# Patient Record
Sex: Female | Born: 1955
Health system: Southern US, Community
[De-identification: ages and names within clinical notes are randomized; demographics above are authoritative.]

## PROBLEM LIST (undated history)

## (undated) DIAGNOSIS — F329 Major depressive disorder, single episode, unspecified: Secondary | ICD-10-CM

## (undated) DIAGNOSIS — M1990 Unspecified osteoarthritis, unspecified site: Secondary | ICD-10-CM

## (undated) DIAGNOSIS — R51 Headache: Secondary | ICD-10-CM

## (undated) DIAGNOSIS — K5790 Diverticulosis of intestine, part unspecified, without perforation or abscess without bleeding: Secondary | ICD-10-CM

## (undated) DIAGNOSIS — R519 Headache, unspecified: Secondary | ICD-10-CM

## (undated) DIAGNOSIS — D649 Anemia, unspecified: Secondary | ICD-10-CM

## (undated) DIAGNOSIS — I471 Supraventricular tachycardia, unspecified: Secondary | ICD-10-CM

## (undated) DIAGNOSIS — K219 Gastro-esophageal reflux disease without esophagitis: Secondary | ICD-10-CM

## (undated) DIAGNOSIS — I499 Cardiac arrhythmia, unspecified: Secondary | ICD-10-CM

## (undated) DIAGNOSIS — F419 Anxiety disorder, unspecified: Secondary | ICD-10-CM

## (undated) DIAGNOSIS — F32A Depression, unspecified: Secondary | ICD-10-CM

## (undated) DIAGNOSIS — Z86010 Personal history of colonic polyps: Secondary | ICD-10-CM

## (undated) HISTORY — DX: Headache, unspecified: R51.9

## (undated) HISTORY — DX: Major depressive disorder, single episode, unspecified: F32.9

## (undated) HISTORY — DX: Headache: R51

## (undated) HISTORY — DX: Supraventricular tachycardia, unspecified: I47.10

## (undated) HISTORY — DX: Gastro-esophageal reflux disease without esophagitis: K21.9

## (undated) HISTORY — PX: DILATION AND CURETTAGE OF UTERUS: SHX78

## (undated) HISTORY — DX: Anxiety disorder, unspecified: F41.9

## (undated) HISTORY — DX: Depression, unspecified: F32.A

## (undated) HISTORY — DX: Unspecified osteoarthritis, unspecified site: M19.90

## (undated) HISTORY — DX: Anemia, unspecified: D64.9

## (undated) HISTORY — DX: Supraventricular tachycardia: I47.1

## (undated) HISTORY — DX: Personal history of colonic polyps: Z86.010

## (undated) HISTORY — DX: Diverticulosis of intestine, part unspecified, without perforation or abscess without bleeding: K57.90

## (undated) HISTORY — PX: FRACTURE SURGERY: SHX138

---

## 2004-12-27 ENCOUNTER — Ambulatory Visit: Payer: Self-pay | Admitting: Unknown Physician Specialty

## 2005-03-12 ENCOUNTER — Ambulatory Visit: Payer: Self-pay | Admitting: Unknown Physician Specialty

## 2009-07-29 DIAGNOSIS — Z860100 Personal history of colon polyps, unspecified: Secondary | ICD-10-CM

## 2009-07-29 HISTORY — DX: Personal history of colon polyps, unspecified: Z86.0100

## 2009-07-29 HISTORY — DX: Personal history of colonic polyps: Z86.010

## 2010-06-15 ENCOUNTER — Ambulatory Visit: Payer: Self-pay | Admitting: Unknown Physician Specialty

## 2010-06-18 LAB — PATHOLOGY REPORT

## 2010-09-12 LAB — HM PAP SMEAR: HM Pap smear: NORMAL

## 2011-08-31 ENCOUNTER — Emergency Department: Payer: Self-pay | Admitting: Emergency Medicine

## 2011-08-31 LAB — COMPREHENSIVE METABOLIC PANEL
Albumin: 4.1 g/dL (ref 3.4–5.0)
BUN: 20 mg/dL — ABNORMAL HIGH (ref 7–18)
Calcium, Total: 8.8 mg/dL (ref 8.5–10.1)
Creatinine: 0.84 mg/dL (ref 0.60–1.30)
EGFR (Non-African Amer.): >60
Glucose: 84 mg/dL (ref 65–99)
Potassium: 3.8 mmol/L (ref 3.5–5.1)
SGOT(AST): 35 U/L (ref 15–37)
SGPT (ALT): 42 U/L
Total Protein: 7.4 g/dL (ref 6.4–8.2)

## 2011-08-31 LAB — CK TOTAL AND CKMB (NOT AT ARMC)
CK, Total: 154 U/L (ref 21–215)
CK-MB: 1.7 ng/mL (ref 0.5–3.6)

## 2011-08-31 LAB — CBC
HCT: 37.4 % (ref 35.0–47.0)
HGB: 12.8 g/dL (ref 12.0–16.0)
MCH: 32.5 pg (ref 26.0–34.0)
MCHC: 34.2 g/dL (ref 32.0–36.0)
MCV: 95 fL (ref 80–100)
Platelet: 246 10*3/uL (ref 150–440)
RBC: 3.93 10*6/uL (ref 3.80–5.20)
RDW: 12.5 % (ref 11.5–14.5)
WBC: 6.3 10*3/uL (ref 3.6–11.0)

## 2011-08-31 LAB — TROPONIN I: Troponin-I: <0.02 ng/mL

## 2011-08-31 LAB — TSH: Thyroid Stimulating Horm: 2.26 u[IU]/mL

## 2011-08-31 LAB — T4, FREE: Free Thyroxine: 0.8 ng/dL (ref 0.76–1.46)

## 2011-10-11 ENCOUNTER — Ambulatory Visit (INDEPENDENT_AMBULATORY_CARE_PROVIDER_SITE_OTHER): Payer: BC Managed Care – PPO | Admitting: Internal Medicine

## 2011-10-11 ENCOUNTER — Encounter: Payer: Self-pay | Admitting: Internal Medicine

## 2011-10-11 ENCOUNTER — Telehealth: Payer: Self-pay | Admitting: Internal Medicine

## 2011-10-11 VITALS — BP 112/68 | HR 94 | Temp 98.1°F | Resp 14 | Ht 65.5 in | Wt 122.8 lb

## 2011-10-11 DIAGNOSIS — G43909 Migraine, unspecified, not intractable, without status migrainosus: Secondary | ICD-10-CM | POA: Insufficient documentation

## 2011-10-11 DIAGNOSIS — I498 Other specified cardiac arrhythmias: Secondary | ICD-10-CM

## 2011-10-11 DIAGNOSIS — K2100 Gastro-esophageal reflux disease with esophagitis, without bleeding: Secondary | ICD-10-CM | POA: Insufficient documentation

## 2011-10-11 DIAGNOSIS — F324 Major depressive disorder, single episode, in partial remission: Secondary | ICD-10-CM

## 2011-10-11 DIAGNOSIS — M810 Age-related osteoporosis without current pathological fracture: Secondary | ICD-10-CM | POA: Insufficient documentation

## 2011-10-11 DIAGNOSIS — Z87898 Personal history of other specified conditions: Secondary | ICD-10-CM

## 2011-10-11 DIAGNOSIS — N3010 Interstitial cystitis (chronic) without hematuria: Secondary | ICD-10-CM

## 2011-10-11 DIAGNOSIS — K5790 Diverticulosis of intestine, part unspecified, without perforation or abscess without bleeding: Secondary | ICD-10-CM | POA: Insufficient documentation

## 2011-10-11 DIAGNOSIS — IMO0002 Reserved for concepts with insufficient information to code with codable children: Secondary | ICD-10-CM

## 2011-10-11 DIAGNOSIS — N840 Polyp of corpus uteri: Secondary | ICD-10-CM

## 2011-10-11 DIAGNOSIS — Z1239 Encounter for other screening for malignant neoplasm of breast: Secondary | ICD-10-CM

## 2011-10-11 DIAGNOSIS — I471 Supraventricular tachycardia: Secondary | ICD-10-CM | POA: Insufficient documentation

## 2011-10-11 HISTORY — DX: Interstitial cystitis (chronic) without hematuria: N30.10

## 2011-10-11 MED ORDER — CLONAZEPAM 0.5 MG PO TABS: 0.5000 mg | ORAL_TABLET | Freq: Two times a day (BID) | ORAL | Status: AC | PRN

## 2011-10-11 NOTE — Assessment & Plan Note (Addendum)
By endoscopy,  No sign of Barretts, has tried aciphex, protonix, and prilosec, with nexium currently her choice as the others were ineffective.

## 2011-10-11 NOTE — Telephone Encounter (Signed)
Pt has mammogram appoinemtn 3/22/ @ [facility] Pt aware of appoinemtn

## 2011-10-11 NOTE — Progress Notes (Deleted)
  Subjective:    Patient ID: Lauren Tanner, female    DOB: 06-05-1956, 56 y.o.   MRN: [id number]  HPI    Review of Systems     Objective:   Physical Exam        Assessment & Plan:

## 2011-10-11 NOTE — Assessment & Plan Note (Signed)
CT scna nefative around 2008 er  Visit look up on [place].

## 2011-10-13 ENCOUNTER — Encounter: Payer: Self-pay | Admitting: Internal Medicine

## 2011-10-13 DIAGNOSIS — R519 Headache, unspecified: Secondary | ICD-10-CM | POA: Insufficient documentation

## 2011-10-13 DIAGNOSIS — F329 Major depressive disorder, single episode, unspecified: Secondary | ICD-10-CM | POA: Insufficient documentation

## 2011-10-13 DIAGNOSIS — F32A Depression, unspecified: Secondary | ICD-10-CM | POA: Insufficient documentation

## 2011-10-13 DIAGNOSIS — Z800 Family history of malignant neoplasm of digestive organs: Secondary | ICD-10-CM | POA: Insufficient documentation

## 2011-10-13 DIAGNOSIS — K219 Gastro-esophageal reflux disease without esophagitis: Secondary | ICD-10-CM | POA: Insufficient documentation

## 2011-10-13 NOTE — Assessment & Plan Note (Signed)
Found remotely incidentally, was seeing DeFrancesco at the time.  D & C punctured the uterus during an attempt to biopsy,  Repeat CT was negative.

## 2011-10-13 NOTE — Assessment & Plan Note (Signed)
With ER visit for evaluation negative for ischemia, electrolyte disturbances,  Thyroid disorder, and atrial fibrillation.  Has not had any prlonged episodes since ER evaluation.  Records requested. Exam is normal

## 2011-10-13 NOTE — Assessment & Plan Note (Signed)
Decreased with preventive medication .  Prior trials of topamax gave her headaches. Managed acutely with naratriptan.  [facility] [place] manages.

## 2011-10-13 NOTE — Assessment & Plan Note (Signed)
Diagnosed by DEXA.  No history of fractures.  treated with alendronate for 5 years,  Medication was stopped in 2012.  May need DEXA this year depending on records. Last one at [facility] or [place]

## 2011-10-13 NOTE — Progress Notes (Signed)
Patient ID: Lauren Tanner, female   DOB: 1955/08/22, 56 y.o.   MRN: [id number]  Patient Active Problem List  Diagnoses  . Reflux esophagitis  . SVT (supraventricular tachycardia)  . Diverticulosis  . History of abdominal pain  . Osteoporosis, post-menopausal  . Uterine polyp  . Chronic interstitial cystitis  . Depression, major, in partial remission  . Migraine headache  . Headache disorder  . GERD (gastroesophageal reflux disease)  . Depression  . History of colonic polyps    Subjective:  CC:   Chief Complaint  Patient presents with  . New Patient    HPI:   Lauren Tanner a 56 y.o. female who presents for establishment of primary care in transfer from [facility] .  Chief complaint is recent episode of supraventricular tachycardia. Patient was evaluated in the emergency room in in February of 2013 for prolonged episode of rapid heartbeat accompanied by presyncopal feelings. She was treated with IV Cardizem. Her evaluation was non-concerning for ischemia and hyperthyroidism as well as atrial fibrillation she was sent home with followup cardiology and saw a PA and medical clinic. Oral diltiazem prescription was given. Patient has not had to use it since that episode. She believes that her symptoms were brought on by a hot flash. She has been having hot flashes for the last year or 2. She has never been on hormone therapy and is not interested in it at this time.   Past Medical History  Diagnosis Date  . SVT (supraventricular tachycardia)     normal screening ER workup  . Diverticulosis   . Headache disorder     managed with imipramine  . GERD (gastroesophageal reflux disease)   . Depression     no history of hospitalizations  . History of colonic polyps 2011    last colonoscopy, [facility]    History reviewed. No pertinent past surgical history.       The following portions of the patient's history were reviewed and updated as appropriate: Allergies, current  medications, and problem list.    Review of Systems:   Comprehensive Review of systems was negative except those addressed in the HPI,     History   Social History  . Marital Status: Married    Spouse Name: N/A    Number of Children: N/A  . Years of Education: N/A   Occupational History  . Not on file.   Social History Main Topics  . Smoking status: Never Smoker   . Smokeless tobacco: Never Used  . Alcohol Use: No  . Drug Use: No  . Sexually Active: Not on file   Other Topics Concern  . Not on file   Social History Narrative  . No narrative on file    Objective:  BP 112/68  Pulse 94  Temp(Src) 98.1 F (36.7 C) (Oral)  Resp 14  Ht 5' 5.5" (1.664 m)  Wt 122 lb 12 oz (55.679 kg)  BMI 20.12 kg/m2  SpO2 100%  General appearance: alert, cooperative and appears stated age Ears: normal TM's and external ear canals both ears Throat: lips, mucosa, and tongue normal; teeth and gums normal Neck: no adenopathy, no carotid bruit, supple, symmetrical, trachea midline and thyroid not enlarged, symmetric, no tenderness/mass/nodules Back: symmetric, no curvature. ROM normal. No CVA tenderness. Lungs: clear to auscultation bilaterally Heart: regular rate and rhythm, S1, S2 normal, no murmur, click, rub or gallop Abdomen: soft, non-tender; bowel sounds normal; no masses,  no organomegaly Pulses: 2+ and symmetric Skin: Skin color,  texture, turgor normal. No rashes or lesions Lymph nodes: Cervical, supraclavicular, and axillary nodes normal.  Assessment and Plan:  Reflux esophagitis By endoscopy,  No sign of Barretts, has tried aciphex, protonix, and prilosec, with nexium currently her choice as the others were ineffective.   History of abdominal pain CT scna nefative around 2008 er  Visit look up on [place].   Migraine headache Decreased with preventive medication .  Prior trials of topamax gave her headaches. Managed acutely with naratriptan.  [facility]  in [place] manages.     Osteoporosis, post-menopausal Diagnosed by DEXA.  No history of fractures.  treated with alendronate for 5 years,  Medication was stopped in 2012.  May need DEXA this year depending on records. Last one at [facility] or [place]   SVT (supraventricular tachycardia) With ER visit for evaluation negative for ischemia, electrolyte disturbances,  Thyroid disorder, and atrial fibrillation.  Has not had any prlonged episodes since ER evaluation.  Records requested. Exam is normal  Uterine polyp Found remotely incidentally, was seeing DeFrancesco at the time.  D & C punctured the uterus during an attempt to biopsy,  Repeat CT was negative.    Chronic interstitial cystitis By histroy with last flare occurring one ago.  Failed all therapy trials .   Her symptoms resolved when her  Improved with change in generic  formulation of lamictal.       Updated Medication List Outpatient Encounter Prescriptions as of 10/11/2011  Medication Sig Dispense Refill  . buPROPion (WELLBUTRIN XL) 300 MG 24 hr tablet Take 300 mg by mouth daily.      . clonazePAM (KLONOPIN) 0.5 MG tablet Take 1 tablet (0.5 mg total) by mouth 2 (two) times daily as needed for anxiety.  30 tablet  0  . esomeprazole (NEXIUM) 40 MG capsule Take 40 mg by mouth daily before breakfast.      . imipramine (TOFRANIL) 50 MG tablet Take 50 mg by mouth 2 (two) times daily.      Marland Kitchen lamoTRIgine (LAMICTAL) 100 MG tablet Take 100 mg by mouth 3 (three) times daily.         Orders Placed This Encounter  Procedures  . HM MAMMOGRAPHY  . HM DEXA SCAN  . HM PAP SMEAR  . HM COLONOSCOPY    No Follow-up on file.

## 2011-10-13 NOTE — Assessment & Plan Note (Signed)
By histroy with last flare occurring one ago.  Failed all therapy trials .   Her symptoms resolved when her  Improved with change in generic  formulation of lamictal.

## 2011-11-01 ENCOUNTER — Encounter: Payer: Self-pay | Admitting: Internal Medicine

## 2012-05-11 ENCOUNTER — Other Ambulatory Visit: Payer: Self-pay

## 2012-05-11 NOTE — Telephone Encounter (Signed)
Patient request refill  for Nexium 40 mg. Ok to refill?

## 2012-05-13 ENCOUNTER — Other Ambulatory Visit: Payer: Self-pay

## 2012-05-13 MED ORDER — ESOMEPRAZOLE MAGNESIUM 40 MG PO CPDR: 40.0000 mg | DELAYED_RELEASE_CAPSULE | Freq: Every day | ORAL | Status: DC

## 2012-05-13 NOTE — Telephone Encounter (Signed)
Nexium 40 mg #30 5 R called in to [facility]

## 2012-05-13 NOTE — Telephone Encounter (Signed)
Okay to refill. Please call the pharmacy and update pharmacy information systemic electronically next time.

## 2012-05-13 NOTE — Telephone Encounter (Signed)
Left message to call back with pharmacy information

## 2012-05-21 ENCOUNTER — Telehealth: Payer: Self-pay | Admitting: Internal Medicine

## 2012-05-21 MED ORDER — ESOMEPRAZOLE MAGNESIUM 40 MG PO CPDR: 40.0000 mg | DELAYED_RELEASE_CAPSULE | Freq: Every day | ORAL | Status: DC

## 2012-05-21 NOTE — Telephone Encounter (Signed)
Patient came in stated that her Nexium 40 mg#90 3 R was sent to wrong pharmacy. I called it [facility] [phone].

## 2012-05-21 NOTE — Telephone Encounter (Signed)
Pt was wanting a call back from [place] she said you called her yesterday [facility] number [phone]

## 2012-06-15 ENCOUNTER — Emergency Department: Payer: Self-pay | Admitting: Emergency Medicine

## 2012-06-15 LAB — COMPREHENSIVE METABOLIC PANEL
Albumin: 4 g/dL (ref 3.4–5.0)
Anion Gap: 5 — ABNORMAL LOW (ref 7–16)
BUN: 22 mg/dL — ABNORMAL HIGH (ref 7–18)
Calcium, Total: 8.4 mg/dL — ABNORMAL LOW (ref 8.5–10.1)
Co2: 27 mmol/L (ref 21–32)
EGFR (African American): >60
EGFR (Non-African Amer.): >60
Glucose: 105 mg/dL — ABNORMAL HIGH (ref 65–99)
Potassium: 4 mmol/L (ref 3.5–5.1)
SGOT(AST): 38 U/L — ABNORMAL HIGH (ref 15–37)
SGPT (ALT): 53 U/L (ref 12–78)
Total Protein: 6.9 g/dL (ref 6.4–8.2)

## 2012-06-15 LAB — CBC
HCT: 33.9 % — ABNORMAL LOW (ref 35.0–47.0)
HGB: 11.6 g/dL — ABNORMAL LOW (ref 12.0–16.0)
MCH: 32.3 pg (ref 26.0–34.0)
Platelet: 215 10*3/uL (ref 150–440)
RBC: 3.6 10*6/uL — ABNORMAL LOW (ref 3.80–5.20)
WBC: 8.8 10*3/uL (ref 3.6–11.0)

## 2012-06-15 LAB — URINALYSIS, COMPLETE
Bilirubin,UR: NEGATIVE
Blood: NEGATIVE
Glucose,UR: NEGATIVE mg/dL (ref 0–75)
Leukocyte Esterase: NEGATIVE
Nitrite: NEGATIVE
Ph: 6 (ref 4.5–8.0)
Protein: NEGATIVE
RBC,UR: NONE SEEN /HPF (ref 0–5)
Specific Gravity: 1.018 (ref 1.003–1.030)
Squamous Epithelial: 1

## 2012-11-17 ENCOUNTER — Other Ambulatory Visit (HOSPITAL_COMMUNITY)
Admission: RE | Admit: 2012-11-17 | Discharge: 2012-11-17 | Disposition: A | Discharge status: Home / Self Care | Payer: BC Managed Care – PPO | Source: Ambulatory Visit | Attending: Internal Medicine | Admitting: Internal Medicine

## 2012-11-17 ENCOUNTER — Encounter: Payer: Self-pay | Admitting: Internal Medicine

## 2012-11-17 ENCOUNTER — Ambulatory Visit (INDEPENDENT_AMBULATORY_CARE_PROVIDER_SITE_OTHER): Payer: BC Managed Care – PPO | Admitting: Internal Medicine

## 2012-11-17 VITALS — BP 106/78 | HR 81 | Temp 97.9°F | Resp 16 | Ht 66.0 in | Wt 114.5 lb

## 2012-11-17 DIAGNOSIS — Z1151 Encounter for screening for human papillomavirus (HPV): Secondary | ICD-10-CM | POA: Insufficient documentation

## 2012-11-17 DIAGNOSIS — R5383 Other fatigue: Secondary | ICD-10-CM

## 2012-11-17 DIAGNOSIS — Z1239 Encounter for other screening for malignant neoplasm of breast: Secondary | ICD-10-CM

## 2012-11-17 DIAGNOSIS — N952 Postmenopausal atrophic vaginitis: Secondary | ICD-10-CM

## 2012-11-17 DIAGNOSIS — E785 Hyperlipidemia, unspecified: Secondary | ICD-10-CM

## 2012-11-17 DIAGNOSIS — R7989 Other specified abnormal findings of blood chemistry: Secondary | ICD-10-CM

## 2012-11-17 DIAGNOSIS — R5381 Other malaise: Secondary | ICD-10-CM

## 2012-11-17 DIAGNOSIS — Z86010 Personal history of colonic polyps: Secondary | ICD-10-CM

## 2012-11-17 DIAGNOSIS — N840 Polyp of corpus uteri: Secondary | ICD-10-CM

## 2012-11-17 DIAGNOSIS — Z0000 Encounter for general adult medical examination without abnormal findings: Secondary | ICD-10-CM

## 2012-11-17 DIAGNOSIS — Z01419 Encounter for gynecological examination (general) (routine) without abnormal findings: Secondary | ICD-10-CM | POA: Insufficient documentation

## 2012-11-17 DIAGNOSIS — M810 Age-related osteoporosis without current pathological fracture: Secondary | ICD-10-CM

## 2012-11-17 DIAGNOSIS — E559 Vitamin D deficiency, unspecified: Secondary | ICD-10-CM

## 2012-11-17 LAB — COMPREHENSIVE METABOLIC PANEL
AST: 30 U/L (ref 0–37)
BUN: 15 mg/dL (ref 6–23)
Calcium: 9.2 mg/dL (ref 8.4–10.5)
Chloride: 101 mEq/L (ref 96–112)
Creatinine, Ser: 0.9 mg/dL (ref 0.4–1.2)
Glucose, Bld: 80 mg/dL (ref 70–99)

## 2012-11-17 LAB — CBC WITH DIFFERENTIAL/PLATELET
Basophils Absolute: 0 10*3/uL (ref 0.0–0.1)
Eosinophils Absolute: 0 10*3/uL (ref 0.0–0.7)
HCT: 36.1 % (ref 36.0–46.0)
Lymphs Abs: 1.6 10*3/uL (ref 0.7–4.0)
MCHC: 33.8 g/dL (ref 30.0–36.0)
MCV: 93.7 fl (ref 78.0–100.0)
Monocytes Absolute: 0.3 10*3/uL (ref 0.1–1.0)
Neutrophils Relative %: 58.1 % (ref 43.0–77.0)
Platelets: 240 10*3/uL (ref 150.0–400.0)
RDW: 12.9 % (ref 11.5–14.6)
WBC: 4.5 10*3/uL (ref 4.5–10.5)

## 2012-11-17 LAB — LIPID PANEL
Cholesterol: 205 mg/dL — ABNORMAL HIGH (ref 0–200)
HDL: 48 mg/dL (ref >39.00)
Triglycerides: 68 mg/dL (ref 0.0–149.0)

## 2012-11-17 LAB — LDL CHOLESTEROL, DIRECT: Direct LDL: 135.9 mg/dL

## 2012-11-17 LAB — TSH: TSH: 2.06 u[IU]/mL (ref 0.35–5.50)

## 2012-11-17 MED ORDER — ESTRADIOL 25 MCG VA TABS: 25.0000 ug | ORAL_TABLET | Freq: Every day | VAGINAL | Status: DC

## 2012-11-17 NOTE — Patient Instructions (Addendum)
You exam is normal  We will call or e mail you the results of your tests today  I have given you both vaginal tablets and vaginal cream so you can try both methods of hormonal treatment for vaginal atrophy  Both methods require nightly use for 2 weeks, the reduce use to twice weekly  If you decide you want to continue the medication,  Let me know and we will write an rx  And order  Baseline ultrasound

## 2012-11-17 NOTE — Assessment & Plan Note (Signed)
Palpable on exam.  If she decides to continue vaginal estrogen we will obtain a baseline pelvic ultrasound

## 2012-11-17 NOTE — Assessment & Plan Note (Signed)
Last DEXA 2008.  Repeating this year.  contineu alendronate and weight bearing exercise

## 2012-11-17 NOTE — Progress Notes (Signed)
Patient ID: Lauren Tanner, female   DOB: 29-Mar-1956, 57 y.o.   MRN: [id number]  Subjective:     Lauren Tanner is a 57 y.o. female and is here for a comprehensive physical exam. The patient reports Vaginal dryness and dyspareunia .     History   Social History  . Marital Status: Married    Spouse Name: N/A    Number of Children: N/A  . Years of Education: N/A   Occupational History  . Not on file.   Social History Main Topics  . Smoking status: Never Smoker   . Smokeless tobacco: Never Used  . Alcohol Use: No  . Drug Use: No  . Sexually Active: Not on file   Other Topics Concern  . Not on file   Social History Narrative  . No narrative on file   Health Maintenance  Topic Date Due  . Tetanus/tdap  12/07/1975  . Influenza Vaccine  03/29/2013  . Pap Smear  09/12/2013  . Mammogram  10/17/2013  . Colonoscopy  06/15/2020    The following portions of the patient's history were reviewed and updated as appropriate: allergies, current medications, past family history, past medical history, past social history, past surgical history and problem list.  Review of Systems A comprehensive review of systems was negative.   Objective:    BP 106/78  Pulse 81  Temp(Src) 97.9 F (36.6 C) (Oral)  Resp 16  Ht 5\' 6"  (1.676 m)  Wt 114 lb 8 oz (51.937 kg)  BMI 18.49 kg/m2  SpO2 95%  General Appearance:    Thin, Alert, cooperative, no distress, appears stated age  Head:    Normocephalic, without obvious abnormality, atraumatic  Eyes:    PERRL, conjunctiva/corneas clear, EOM's intact, fundi    benign, both eyes  Ears:    Normal TM's and external ear canals, both ears  Nose:   Nares normal, septum midline, mucosa normal, no drainage    or sinus tenderness  Throat:   Lips, mucosa, and tongue normal; teeth and gums normal  Neck:   Supple, symmetrical, trachea midline, no adenopathy;    thyroid:  no enlargement/tenderness/nodules; no carotid   bruit or JVD  Back:     Symmetric, no  curvature, ROM normal, no CVA tenderness  Lungs:     Clear to auscultation bilaterally, respirations unlabored  Chest Wall:    No tenderness or deformity   Heart:    Regular rate and rhythm, S1 and S2 normal, no murmur, rub   or gallop  Breast Exam:    No tenderness, masses, or nipple abnormality  Abdomen:     Soft, non-tender, bowel sounds active all four quadrants,    no masses, no organomegaly  Genitalia:    Pelvic: cervix normal in appearance, external genitalia normal, no adnexal masses or tenderness, no cervical motion tenderness, rectovaginal septum normal, uterus enlarged for age,  Fibroid appreciated, vagina pale  without discharge,  Vault narrow,  Decreased moisture  Extremities:   Extremities normal, atraumatic, no cyanosis or edema  Pulses:   2+ and symmetric all extremities  Skin:   Skin color, texture, turgor normal, no rashes or lesions  Lymph nodes:   Cervical, supraclavicular, and axillary nodes normal  Neurologic:   CNII-XII intact, normal strength, sensation and reflexes    throughout     .    Assessment:      Uterine polyp Palpable on exam.  If she decides to continue vaginal estrogen we will obtain a baseline pelvic  ultrasound   Osteoporosis, post-menopausal Last DEXA 2008.  Repeating this year.  contineu alendronate and weight bearing exercise  History of colonic polyps Sent home with FOBTS to do annually until 2016.   Postmenopausal atrophic vaginitis Discussed trial of topical vaginal hormonal therapy.  Samples of  Vagifem and Premarin vaginal cream given for a month trial.  If she decides to continue one of the medications,  We will get a baseline ultrasound.    Updated Medication List Outpatient Encounter Prescriptions as of 11/17/2012  Medication Sig Dispense Refill  . buPROPion (WELLBUTRIN XL) 300 MG 24 hr tablet Take 300 mg by mouth daily.      Marland Kitchen esomeprazole (NEXIUM) 40 MG capsule Take 1 capsule (40 mg total) by mouth daily before breakfast.  90  capsule  3  . imipramine (TOFRANIL) 50 MG tablet Take 50 mg by mouth 2 (two) times daily.      Marland Kitchen lamoTRIgine (LAMICTAL) 100 MG tablet Take 100 mg by mouth 3 (three) times daily.       No facility-administered encounter medications on file as of 11/17/2012.

## 2012-11-17 NOTE — Assessment & Plan Note (Signed)
Sent home with FOBTS to do annually until 2016.

## 2012-11-17 NOTE — Assessment & Plan Note (Signed)
Discussed trial of topical vaginal hormonal therapy.  Samples of  Vagifem and Premarin vaginal cream given for a month trial.  If she decides to continue one of the medications,  We will get a baseline ultrasound.

## 2012-11-18 NOTE — Addendum Note (Signed)
Addended by: Sherlene Shams on: 11/18/2012 06:53 AM   Modules accepted: Orders

## 2012-12-01 ENCOUNTER — Telehealth: Payer: Self-pay | Admitting: Internal Medicine

## 2012-12-01 MED ORDER — DENOSUMAB 60 MG/ML ~~LOC~~ SOLN: 60.0000 mg | Freq: Once | SUBCUTANEOUS | Status: AC

## 2012-12-01 NOTE — Telephone Encounter (Signed)
Patient states previous MD took her off the Alendronate and she does not currently have a script needs [facility] for [facility].

## 2012-12-01 NOTE — Telephone Encounter (Signed)
After further review,  Since she already took it for 5 years and has reflux esophagitis, it is not my first choice to resume it  I would like to see if her insurance will pay for the twice annual shot called Prolia.

## 2012-12-01 NOTE — Telephone Encounter (Signed)
Lauren Tanner,  See previously unrouted note to you

## 2012-12-01 NOTE — Telephone Encounter (Signed)
Called patient left detailed message as to trying to get authorization for prolia

## 2012-12-01 NOTE — Telephone Encounter (Signed)
DEXA scan shows osteoporosis in the spine , osteopenia in the hip. continue alendronate, caclium and vit d

## 2012-12-01 NOTE — Telephone Encounter (Signed)
Lauren Tanner,  I would like this patient to start Prolia injections for postmenopausal osteoporosis.  Please find out if her insurance will pay for it. I noticed that [facility] has a prolia injection form .  Are we supposed to be using these forms when administered or only if we sent them to [facility] to have the injection?  TT

## 2012-12-03 ENCOUNTER — Telehealth: Payer: Self-pay | Admitting: *Deleted

## 2012-12-03 NOTE — Telephone Encounter (Signed)
On [facility] pharmacy would like to clarify as to one time dose or cycle.

## 2012-12-04 ENCOUNTER — Other Ambulatory Visit: Payer: BC Managed Care – PPO

## 2012-12-04 NOTE — Telephone Encounter (Signed)
Notified pharmacy script should be filled through our facility.

## 2012-12-04 NOTE — Telephone Encounter (Signed)
Prolia is twice a year. She shouldn't be getting it through her pharmacy.  It should be ordered through [facility] and delivered to office because it is $$$$$$$$$

## 2012-12-07 ENCOUNTER — Other Ambulatory Visit: Payer: BC Managed Care – PPO

## 2012-12-08 ENCOUNTER — Other Ambulatory Visit (INDEPENDENT_AMBULATORY_CARE_PROVIDER_SITE_OTHER): Payer: BC Managed Care – PPO

## 2012-12-08 DIAGNOSIS — R7989 Other specified abnormal findings of blood chemistry: Secondary | ICD-10-CM

## 2012-12-08 LAB — HEPATIC FUNCTION PANEL
ALT: 45 U/L — ABNORMAL HIGH (ref 0–35)
AST: 29 U/L (ref 0–37)
Total Bilirubin: 0.6 mg/dL (ref 0.3–1.2)
Total Protein: 6.5 g/dL (ref 6.0–8.3)

## 2012-12-08 LAB — IRON AND TIBC
Iron: 77 ug/dL (ref 42–145)
UIBC: 193 ug/dL (ref 125–400)

## 2012-12-09 LAB — FERRITIN: Ferritin: 83 ng/mL (ref 10–291)

## 2012-12-09 LAB — HEPATITIS B SURFACE ANTIBODY,QUALITATIVE: Hep B S Ab: REACTIVE — AB

## 2012-12-09 LAB — ANTI-SMITH ANTIBODY: ENA SM Ab Ser-aCnc: <1 AU/mL (ref <30)

## 2012-12-10 ENCOUNTER — Encounter: Payer: Self-pay | Admitting: Internal Medicine

## 2012-12-10 NOTE — Addendum Note (Signed)
Addended by: Sherlene Shams on: 12/10/2012 10:41 AM   Modules accepted: Orders

## 2012-12-15 ENCOUNTER — Telehealth: Payer: Self-pay | Admitting: *Deleted

## 2012-12-15 NOTE — Telephone Encounter (Signed)
Unread MyChart message: "Your additional blood work suggested no infectious or autoimmune causes for the slight liver enzyme elevation that persists on your hepatic panel. The one positive hepatitis antibody test suggests that you have received the hepatitis b vaccine in the past . If this is not true , Let me know.   I would like to obtain an ultrasound of your liver for additional evaluation . Amber will set that up and call you."  Left message for pt to return my call.

## 2012-12-16 NOTE — Telephone Encounter (Signed)
Yes patient had Hepatitis vaccine in the past but couldn't remember date.  Patient notified as requested as to referral

## 2012-12-18 ENCOUNTER — Ambulatory Visit: Payer: Self-pay | Admitting: Internal Medicine

## 2012-12-22 ENCOUNTER — Telehealth: Payer: Self-pay | Admitting: Internal Medicine

## 2012-12-22 NOTE — Telephone Encounter (Signed)
Ultrasound of liver normal except for a polyp in the gallbladder.  Nothing to do

## 2012-12-22 NOTE — Telephone Encounter (Signed)
Patient notified of results by phone.

## 2012-12-25 ENCOUNTER — Encounter: Payer: Self-pay | Admitting: Internal Medicine

## 2013-01-20 LAB — HM PAP SMEAR: HM PAP: NORMAL

## 2013-02-02 LAB — HM DEXA SCAN

## 2013-02-12 ENCOUNTER — Encounter: Payer: Self-pay | Admitting: Internal Medicine

## 2013-02-22 ENCOUNTER — Encounter: Payer: Self-pay | Admitting: Internal Medicine

## 2013-02-23 NOTE — Telephone Encounter (Signed)
Yes, but you will need to check that it has been received.  I will forward your inquiry

## 2013-05-27 ENCOUNTER — Telehealth: Payer: Self-pay | Admitting: *Deleted

## 2013-05-27 MED ORDER — ESOMEPRAZOLE MAGNESIUM 40 MG PO CPDR: 40.0000 mg | DELAYED_RELEASE_CAPSULE | Freq: Every day | ORAL | Status: DC

## 2013-05-27 NOTE — Telephone Encounter (Signed)
Called for script refill on Nexium sent electronically

## 2013-06-03 ENCOUNTER — Other Ambulatory Visit: Payer: Self-pay

## 2013-09-15 ENCOUNTER — Telehealth: Payer: Self-pay | Admitting: Internal Medicine

## 2013-09-15 NOTE — Telephone Encounter (Signed)
Paper received from Prolia for injection reminder.  Please tell Lupita LeashDonna when last Prolia was received so insurance authorization can be sent.

## 2013-09-17 NOTE — Telephone Encounter (Signed)
Patient has not received first dose of prolia patient scheduled to come in may appointment was cancelled and never rescheuled

## 2014-01-20 ENCOUNTER — Encounter: Payer: Self-pay | Admitting: Internal Medicine

## 2014-01-20 ENCOUNTER — Ambulatory Visit (INDEPENDENT_AMBULATORY_CARE_PROVIDER_SITE_OTHER): Payer: BC Managed Care – PPO | Admitting: Internal Medicine

## 2014-01-20 ENCOUNTER — Telehealth: Payer: Self-pay | Admitting: Internal Medicine

## 2014-01-20 VITALS — BP 118/74 | HR 88 | Temp 98.6°F | Resp 16 | Ht 65.25 in | Wt 116.5 lb

## 2014-01-20 DIAGNOSIS — Z0000 Encounter for general adult medical examination without abnormal findings: Secondary | ICD-10-CM

## 2014-01-20 DIAGNOSIS — R259 Unspecified abnormal involuntary movements: Secondary | ICD-10-CM

## 2014-01-20 DIAGNOSIS — R748 Abnormal levels of other serum enzymes: Secondary | ICD-10-CM

## 2014-01-20 DIAGNOSIS — IMO0002 Reserved for concepts with insufficient information to code with codable children: Secondary | ICD-10-CM

## 2014-01-20 DIAGNOSIS — M6281 Muscle weakness (generalized): Secondary | ICD-10-CM

## 2014-01-20 DIAGNOSIS — E559 Vitamin D deficiency, unspecified: Secondary | ICD-10-CM

## 2014-01-20 DIAGNOSIS — M810 Age-related osteoporosis without current pathological fracture: Secondary | ICD-10-CM

## 2014-01-20 DIAGNOSIS — F3341 Major depressive disorder, recurrent, in partial remission: Secondary | ICD-10-CM

## 2014-01-20 DIAGNOSIS — Z1239 Encounter for other screening for malignant neoplasm of breast: Secondary | ICD-10-CM

## 2014-01-20 DIAGNOSIS — R251 Tremor, unspecified: Secondary | ICD-10-CM

## 2014-01-20 LAB — CBC WITH DIFFERENTIAL/PLATELET
BASOS ABS: 0 10*3/uL (ref 0.0–0.1)
Basophils Relative: 0.7 % (ref 0.0–3.0)
EOS ABS: 0 10*3/uL (ref 0.0–0.7)
Eosinophils Relative: 0 % (ref 0.0–5.0)
HEMATOCRIT: 35.5 % — AB (ref 36.0–46.0)
HEMOGLOBIN: 12.1 g/dL (ref 12.0–15.0)
LYMPHS ABS: 1.7 10*3/uL (ref 0.7–4.0)
Lymphocytes Relative: 33.8 % (ref 12.0–46.0)
MCHC: 34 g/dL (ref 30.0–36.0)
MCV: 95.1 fl (ref 78.0–100.0)
MONO ABS: 0.3 10*3/uL (ref 0.1–1.0)
Monocytes Relative: 6.6 % (ref 3.0–12.0)
NEUTROS ABS: 2.9 10*3/uL (ref 1.4–7.7)
Neutrophils Relative %: 58.9 % (ref 43.0–77.0)
Platelets: 279 10*3/uL (ref 150.0–400.0)
RBC: 3.73 Mil/uL — ABNORMAL LOW (ref 3.87–5.11)
RDW: 12.5 % (ref 11.5–15.5)
WBC: 4.9 10*3/uL (ref 4.0–10.5)

## 2014-01-20 LAB — COMPREHENSIVE METABOLIC PANEL
ALBUMIN: 4.2 g/dL (ref 3.5–5.2)
ALK PHOS: 62 U/L (ref 39–117)
ALT: 80 U/L — AB (ref 0–35)
AST: 43 U/L — ABNORMAL HIGH (ref 0–37)
BILIRUBIN TOTAL: 0.8 mg/dL (ref 0.2–1.2)
BUN: 23 mg/dL (ref 6–23)
CO2: 28 mEq/L (ref 19–32)
Calcium: 9.3 mg/dL (ref 8.4–10.5)
Chloride: 102 mEq/L (ref 96–112)
Creatinine, Ser: 0.9 mg/dL (ref 0.4–1.2)
GFR: 71.3 mL/min (ref >60.00)
GLUCOSE: 73 mg/dL (ref 70–99)
POTASSIUM: 5 meq/L (ref 3.5–5.1)
SODIUM: 137 meq/L (ref 135–145)
TOTAL PROTEIN: 7.1 g/dL (ref 6.0–8.3)

## 2014-01-20 LAB — CK: Total CK: 130 U/L (ref 7–177)

## 2014-01-20 LAB — VITAMIN B12: Vitamin B-12: 1168 pg/mL — ABNORMAL HIGH (ref 211–911)

## 2014-01-20 LAB — TSH: TSH: 1.26 u[IU]/mL (ref 0.35–4.50)

## 2014-01-20 LAB — VITAMIN D 25 HYDROXY (VIT D DEFICIENCY, FRACTURES): VITD: 54.77 ng/mL

## 2014-01-20 NOTE — Patient Instructions (Signed)
You had your annual  wellness exam today.  We will repeat your PAP smear in 2017  Tremor is not commonly reported with either wellbutrin or with lamictal.  [facility]  We will contact you with the bloodwork results  Tremor Tremor is a rhythmic, involuntary muscular contraction characterized by oscillations (to-and-fro movements) of a part of the body. The most common of all involuntary movements, tremor can affect various body parts such as the hands, head, facial structures, vocal cords, trunk, and legs; most tremors, however, occur in the hands. Tremor often accompanies neurological disorders associated with aging. Although the disorder is not life-threatening, it can be responsible for functional disability and social embarrassment. TREATMENT  There are many types of tremor and several ways in which tremor is classified. The most common classification is by behavioral context or position. There are five categories of tremor within this classification: resting, postural, kinetic, task-specific, and psychogenic. Resting or static tremor occurs when the muscle is at rest, for example when the hands are lying on the lap. This type of tremor is often seen in patients with Parkinson's disease. Postural tremor occurs when a patient attempts to maintain posture, such as holding the hands outstretched. Postural tremors include physiological tremor, essential tremor, tremor with basal ganglia disease (also seen in patients with Parkinson's disease), cerebellar postural tremor, tremor with peripheral neuropathy, post-traumatic tremor, and alcoholic tremor. Kinetic or intention (action) tremor occurs during purposeful movement, for example during finger-to-nose testing. Task-specific tremor appears when performing goal-oriented tasks such as handwriting, speaking, or standing. This group consists of primary writing tremor, vocal tremor, and orthostatic tremor. Psychogenic tremor  occurs in both older and younger patients. The key feature of this tremor is that it dramatically lessens or disappears when the patient is distracted. PROGNOSIS There are some treatment options available for tremor; the appropriate treatment depends on accurate diagnosis of the cause. Some tremors respond to treatment of the underlying condition, for example in some cases of psychogenic tremor treating the patient's underlying mental problem may cause the tremor to disappear. Also, patients with tremor due to Parkinson's disease may be treated with Levodopa drug therapy. Symptomatic drug therapy is available for several other tremors as well. For those cases of tremor in which there is no effective drug treatment, physical measures such as teaching the patient to brace the affected limb during the tremor are sometimes useful. Surgical intervention such as thalamotomy or deep brain stimulation may be useful in certain cases. Document Released: 07/05/2002 Document Revised: 10/07/2011 Document Reviewed: 07/15/2005 [facility] Patient Information 2015 [place], [place]. This information is not intended to replace advice given to you by your health care Lyra Alaimo. Make sure you discuss any questions you have with your health care [place].

## 2014-01-20 NOTE — Telephone Encounter (Signed)
Could you please check for insurance authorization for this pt to receive Prolia.  Appt was scheduled in 2014 but pt cancelled/rescheduled and never followed up.

## 2014-01-20 NOTE — Progress Notes (Signed)
Patient ID: Lauren Tanner Querry, female   DOB: [birth year], 58 y.o.   MRN: [id number]   Subjective:     Lauren Tanner Costilla is a 58 y.o. female and is here for a comprehensive physical exam. The patient reports .with minor bilateral hand tremor. Has been waiting for her first prolia injection  since may 2014!!!!!   History   Social History  . Marital Status: Married    Spouse Name: N/A    Number of Children: N/A  . Years of Education: N/A   Occupational History  . Not on file.   Social History Main Topics  . Smoking status: Never Smoker   . Smokeless tobacco: Never Used  . Alcohol Use: No  . Drug Use: No  . Sexual Activity: Not on file   Other Topics Concern  . Not on file   Social History Narrative  . No narrative on file   Health Maintenance  Topic Date Due  . Influenza Vaccine  02/26/2014  . Mammogram  12/02/2014  . Pap Smear  01/21/2016  . Tetanus/tdap  01/21/2019  . Colonoscopy  06/15/2020    The following portions of the patient's history were reviewed and updated as appropriate: allergies, current medications, past family history, past medical history, past social history, past surgical history and problem list.  Review of Systems A comprehensive review of systems was negative.   Objective:   BP 118/74  Pulse 88  Temp(Src) 98.6 F (37 C) (Oral)  Resp 16  Ht 5' 5.25" (1.657 m)  Wt 116 lb 8 oz (52.844 kg)  BMI 19.25 kg/m2  SpO2 98%  Healthy female exam. General appearance: alert, cooperative and appears stated age Head: Normocephalic, without obvious abnormality, atraumatic Eyes: conjunctivae/corneas clear. PERRL, EOM's intact. Fundi benign. Ears: normal TM's and external ear canals both ears Nose: Nares normal. Septum midline. Mucosa normal. No drainage or sinus tenderness. Throat: lips, mucosa, and tongue normal; teeth and gums normal Neck: no adenopathy, no carotid bruit, no JVD, supple, symmetrical, trachea midline and thyroid not enlarged, symmetric, no  tenderness/mass/nodules Lungs: clear to auscultation bilaterally Breasts: normal appearance, no masses or tenderness Heart: regular rate and rhythm, S1, S2 normal, no murmur, click, rub or gallop Abdomen: soft, non-tender; bowel sounds normal; no masses,  no organomegaly Extremities: extremities normal, atraumatic, no cyanosis or edema Pulses: 2+ and symmetric Skin: Skin color, texture, turgor normal. No rashes or lesions Neurologic: Alert and oriented X 3, normal strength and tone. Normal symmetric reflexes. Normal coordination and gait.     .    Assessment and plan:       Tremor of both hands She reports an intention tremor of both hands that on exam today is very fine. Thyroid function is normal.  Reassured her that her tremor was not characteristic of parkinson's disease    Lab Results  Component Value Date   TSH 1.26 01/20/2014     Osteoporosis, post-menopausal She has been waiting for authorization for prolia since her last annual visit May 2014. Discussed the current controversies surrounding the risks and benefits of calcium supplementation.  Encouraged her to increase dietary calcium through natural foods including almond/coconut milk  Routine general medical examination at a health care facility nnual comprehensive exam was done including breast, excluding pelvic and PAP smear. All screenings have been addressed .     Due for next scope 2016 due to history of polyp  In 2011 and FH colon Ca  Depression, major, in partial remission Managed by [facility] psychiatrist  Lucas MallowEva Sikora.  Symptoms are stable   Updated Medication List Outpatient Encounter Prescriptions as of 01/20/2014  Medication Sig  . buPROPion (WELLBUTRIN XL) 300 MG 24 hr tablet Take 300 mg by mouth daily.  Marland Kitchen. esomeprazole (NEXIUM) 40 MG capsule Take 1 capsule (40 mg total) by mouth daily before breakfast.  . estradiol (VAGIFEM) 25 MCG vaginal tablet Place 1 tablet (25 mcg total) vaginally daily. For two weeks,   Then twice weekly thereafter  . imipramine (TOFRANIL) 50 MG tablet Take 50 mg by mouth 2 (two) times daily.  Marland Kitchen. lamoTRIgine (LAMICTAL) 100 MG tablet Take 100 mg by mouth 3 (three) times daily.  Marland Kitchen. denosumab (PROLIA) 60 MG/ML SOLN injection Inject 60 mg into the skin once. Administer in upper arm, thigh, or abdomen

## 2014-01-21 LAB — FOLATE RBC: RBC FOLATE: 407 ng/mL (ref >280)

## 2014-01-22 ENCOUNTER — Encounter: Payer: Self-pay | Admitting: Internal Medicine

## 2014-01-22 DIAGNOSIS — R251 Tremor, unspecified: Secondary | ICD-10-CM | POA: Insufficient documentation

## 2014-01-22 NOTE — Assessment & Plan Note (Signed)
She reports an intention tremor of both hands that on exam today is very fine. Thyroid function is normal.  Reassured her that her tremor was not characteristic of parkinson's disease    Lab Results  Component Value Date   TSH 1.26 01/20/2014

## 2014-01-22 NOTE — Assessment & Plan Note (Signed)
She has been waiting for authorization for prolia since her last annual visit May 2014. Discussed the current controversies surrounding the risks and benefits of calcium supplementation.  Encouraged her to increase dietary calcium through natural foods including almond/coconut milk

## 2014-01-22 NOTE — Assessment & Plan Note (Addendum)
nnual comprehensive exam was done including breast, excluding pelvic and PAP smear. All screenings have been addressed .     Due for next scope 2016 due to history of polyp  In 2011 and FH colon Ca

## 2014-01-22 NOTE — Assessment & Plan Note (Signed)
Managed by [facility] psychiatrist Lucas MallowEva Sikora.  Symptoms are stable

## 2014-01-25 NOTE — Telephone Encounter (Signed)
Mailed unread message to pt  

## 2014-01-29 ENCOUNTER — Encounter: Payer: Self-pay | Admitting: Internal Medicine

## 2014-02-01 NOTE — Telephone Encounter (Signed)
I have sent pt's info to [employer] for insurance verification.  I will notify you as soon as I have a response. Thank you.

## 2014-02-02 MED ORDER — ESOMEPRAZOLE MAGNESIUM 40 MG PO CPDR: 40.0000 mg | DELAYED_RELEASE_CAPSULE | Freq: Every day | ORAL | Status: DC

## 2014-02-10 NOTE — Telephone Encounter (Signed)
[employer] is requiring a prior authorization for pt's Prolia injection.  Once I receive the prior auth form I will send to you for Dr. Darrick Huntsmanullo to complete and sign.  What # do you want me to fax the form to once I receive? Thank you.

## 2014-03-01 NOTE — Telephone Encounter (Signed)
I faxed the [employer] prior authorization form to your attn @ [phone]. Please ask Dr. Darrick Huntsmanullo to complete the form and fax it along w/the clinicals back to me at [phone]. Thank you.

## 2014-03-08 NOTE — Telephone Encounter (Signed)
I faxed the p/a form and clinicals to [facility] and will notify you as soon as I have an authorization # from them. Thank you.

## 2014-04-11 NOTE — Telephone Encounter (Signed)
Please advise of Prolia

## 2014-04-18 NOTE — Telephone Encounter (Signed)
We are still waiting on authorization from [facility].  I will notify you as soon as I have a response. Thank you.

## 2014-04-18 NOTE — Telephone Encounter (Signed)
Ok thanks, Advised patient of status of prolia left detailed message on [employer].

## 2014-04-29 NOTE — Telephone Encounter (Signed)
Please advise 

## 2014-04-29 NOTE — Telephone Encounter (Signed)
I called [employer] to check the status of Ms. Compston' prior authorization for Prolia.  I spoke w/LaToya D and she says the Prolia has been approved for 03/08/2014 thru 03/08/2015 w/a reference #[id number].  With an OV pt will have an estimated responsibility of a $15 copay which includes the admin and cost of Prolia; w/out an OV, pt will have an estimated responsibility of $0.  Please make sure the pt is aware this is an estimate and we can not determine an exact amt until her insurance has paid. I have sent a copy of the summary of benefits to be scanned into her chart. If you have further questions, please let me know. Thank you.

## 2014-04-29 NOTE — Telephone Encounter (Signed)
Advise what?

## 2014-07-10 ENCOUNTER — Encounter: Payer: Self-pay | Admitting: Internal Medicine

## 2014-07-11 NOTE — Telephone Encounter (Signed)
Ioana.  Thank you for your e mail. I was not aware that you had not been contacted or updated, and I apologize for the office's failure to do so. .  It appears that the prolia injection HAS BEEN approved by your insurance, but the message did not get routed to you.  I will have the office call you tomorrow to set up your appt to have the injection.   Regards,  Dr. Darrick Huntsmanullo

## 2014-08-08 ENCOUNTER — Telehealth: Payer: Self-pay | Admitting: Internal Medicine

## 2014-08-08 NOTE — Telephone Encounter (Signed)
The patient is requesting a prolia injection .

## 2014-08-08 NOTE — Telephone Encounter (Signed)
I am assuming pt did not get an injection when it was approved in August of last  Year (see phone note dated 01/20/2014).  I still have the prior authorization from that one, but since we have started a new year, I went a head and electronically sent the info for insurance verification, just in case her plan has changed. I will notify you once I have a response. Thank you.

## 2014-08-09 NOTE — Telephone Encounter (Signed)
Thank you :)

## 2014-08-29 NOTE — Telephone Encounter (Signed)
I have rec'd pt's Prolia insurance verification and [employer] required a prior authorization, but we still have one that I verbally confirmed 04/29/2014. Prolia has been approved for 03/08/2014 thru 03/08/2015 w/a reference #[id number].  With an OV pt will have an estimated responsibility of a $25 co-pay; w/out an OV pt will have an estimated responsibility of $0. Please advise pt this is an estimate and we will not know an exact amt until the insurance has paid. I have sent a copy of the summary of benefits to be scanned into pt's chart. Please let me know if you have any questions. Thank you.

## 2014-08-30 NOTE — Telephone Encounter (Signed)
Sent mychart. Prolia already in office for pt, just needs to schedule appt

## 2014-08-30 NOTE — Telephone Encounter (Signed)
Patient scheduled for 08-31-14

## 2014-08-31 ENCOUNTER — Ambulatory Visit (INDEPENDENT_AMBULATORY_CARE_PROVIDER_SITE_OTHER): Payer: BLUE CROSS/BLUE SHIELD | Admitting: *Deleted

## 2014-08-31 DIAGNOSIS — M810 Age-related osteoporosis without current pathological fracture: Secondary | ICD-10-CM

## 2014-08-31 MED ORDER — DENOSUMAB 60 MG/ML ~~LOC~~ SOLN
60.0000 mg | Freq: Once | SUBCUTANEOUS | Status: AC
  Administered 2014-08-31: 60 mg via SUBCUTANEOUS

## 2014-11-23 ENCOUNTER — Encounter: Payer: Self-pay | Admitting: Nurse Practitioner

## 2014-11-23 ENCOUNTER — Ambulatory Visit (INDEPENDENT_AMBULATORY_CARE_PROVIDER_SITE_OTHER): Payer: BLUE CROSS/BLUE SHIELD | Admitting: Nurse Practitioner

## 2014-11-23 VITALS — BP 101/72 | HR 88 | Temp 98.0°F | Resp 12 | Ht 65.25 in | Wt 121.1 lb

## 2014-11-23 DIAGNOSIS — J069 Acute upper respiratory infection, unspecified: Secondary | ICD-10-CM

## 2014-11-23 MED ORDER — HYDROCOD POLST-CPM POLST ER 10-8 MG/5ML PO SUER: 5.0000 mL | Freq: Every evening | ORAL | Status: DC | PRN

## 2014-11-23 MED ORDER — AZITHROMYCIN 250 MG PO TABS: ORAL_TABLET | ORAL | Status: DC

## 2014-11-23 NOTE — Progress Notes (Signed)
   Subjective:    Patient ID: Lauren PotashMona Tanner, female    DOB: 1955-09-13, 59 y.o.   MRN: [id number]  HPI  Ms. Idolina PrimerDevries is a 59 yo female with a CC of productive cough x 1 week.   1) Chills, fatigue, cough, hard to take a deep breath, Rhinorrhea, and no improvement   EZ losanges-helpful  Mucinex- helpful   Review of Systems  Constitutional: Positive for chills and fatigue. Negative for fever and diaphoresis.  HENT: Positive for rhinorrhea and sore throat. Negative for congestion, ear discharge, ear pain, sinus pressure and sneezing.   Eyes: Negative for discharge, redness and itching.  Respiratory: Positive for cough, shortness of breath and wheezing. Negative for chest tightness.   Cardiovascular: Negative for chest pain, palpitations and leg swelling.  Gastrointestinal: Negative for nausea, vomiting and diarrhea.  Skin: Negative for rash.  Neurological: Negative for dizziness and headaches.      Objective:   Physical Exam  Constitutional: She is oriented to person, place, and time. She appears well-developed and well-nourished. No distress.  BP 101/72 mmHg  Pulse 88  Temp(Src) 98 F (36.7 C) (Oral)  Resp 12  Ht 5' 5.25" (1.657 m)  Wt 121 lb 1.9 oz (54.94 kg)  BMI 20.01 kg/m2  SpO2 98%   HENT:  Head: Normocephalic and atraumatic.  Right Ear: External ear normal.  Left Ear: External ear normal.  Mouth/Throat: Oropharynx is clear and moist.  TM's clear bilaterally  Eyes: Right eye exhibits no discharge. Left eye exhibits no discharge. No scleral icterus.  Cardiovascular: Normal rate, regular rhythm and normal heart sounds.  Exam reveals no gallop and no friction rub.   No murmur heard. Pulmonary/Chest: Effort normal and breath sounds normal. No respiratory distress. She has no wheezes. She has no rales. She exhibits no tenderness.  Decreased breath sounds equally all over   Neurological: She is alert and oriented to person, place, and time. No cranial nerve deficit. She  exhibits normal muscle tone. Coordination normal.  Skin: Skin is warm and dry. No rash noted. She is not diaphoretic.  Psychiatric: She has a normal mood and affect. Her behavior is normal. Judgment and thought content normal.       Assessment & Plan:

## 2014-11-23 NOTE — Patient Instructions (Signed)
Take the Z-pack as directed.   Take the probiotic while on this.   Mucinex okay to take while on this.

## 2014-11-23 NOTE — Progress Notes (Signed)
Pre visit review using our clinic review tool, if applicable. No additional management support is needed unless otherwise documented below in the visit note. 

## 2014-11-23 NOTE — Assessment & Plan Note (Signed)
7 days no improvement. Will try Z-pack asked pt to take probiotics while on this. Continue Mucinex. Gave script for Tussionex and informed pt that it is to take to the pharmacy to be filled.

## 2015-02-01 ENCOUNTER — Telehealth: Payer: Self-pay | Admitting: Internal Medicine

## 2015-02-01 ENCOUNTER — Encounter: Payer: Self-pay | Admitting: Internal Medicine

## 2015-02-01 ENCOUNTER — Ambulatory Visit (INDEPENDENT_AMBULATORY_CARE_PROVIDER_SITE_OTHER): Payer: BLUE CROSS/BLUE SHIELD | Admitting: Internal Medicine

## 2015-02-01 VITALS — BP 103/71 | HR 94 | Temp 98.1°F | Ht 65.5 in | Wt 121.1 lb

## 2015-02-01 DIAGNOSIS — E559 Vitamin D deficiency, unspecified: Secondary | ICD-10-CM

## 2015-02-01 DIAGNOSIS — R5383 Other fatigue: Secondary | ICD-10-CM | POA: Diagnosis not present

## 2015-02-01 DIAGNOSIS — Z0000 Encounter for general adult medical examination without abnormal findings: Secondary | ICD-10-CM

## 2015-02-01 DIAGNOSIS — E785 Hyperlipidemia, unspecified: Secondary | ICD-10-CM | POA: Diagnosis not present

## 2015-02-01 DIAGNOSIS — Z1239 Encounter for other screening for malignant neoplasm of breast: Secondary | ICD-10-CM | POA: Diagnosis not present

## 2015-02-01 DIAGNOSIS — Z1211 Encounter for screening for malignant neoplasm of colon: Secondary | ICD-10-CM

## 2015-02-01 DIAGNOSIS — M810 Age-related osteoporosis without current pathological fracture: Secondary | ICD-10-CM

## 2015-02-01 DIAGNOSIS — Z800 Family history of malignant neoplasm of digestive organs: Secondary | ICD-10-CM

## 2015-02-01 DIAGNOSIS — I471 Supraventricular tachycardia: Secondary | ICD-10-CM

## 2015-02-01 LAB — COMPREHENSIVE METABOLIC PANEL
ALBUMIN: 4.1 g/dL (ref 3.5–5.2)
ALT: 45 U/L — AB (ref 0–35)
AST: 35 U/L (ref 0–37)
Alkaline Phosphatase: 50 U/L (ref 39–117)
BUN: 21 mg/dL (ref 6–23)
CALCIUM: 9.6 mg/dL (ref 8.4–10.5)
CHLORIDE: 103 meq/L (ref 96–112)
CO2: 29 mEq/L (ref 19–32)
Creatinine, Ser: 0.97 mg/dL (ref 0.40–1.20)
GFR: 62.66 mL/min (ref >60.00)
Glucose, Bld: 54 mg/dL — ABNORMAL LOW (ref 70–99)
Potassium: 4.6 mEq/L (ref 3.5–5.1)
SODIUM: 139 meq/L (ref 135–145)
Total Bilirubin: 0.5 mg/dL (ref 0.2–1.2)
Total Protein: 6.9 g/dL (ref 6.0–8.3)

## 2015-02-01 LAB — LIPID PANEL
CHOL/HDL RATIO: 4
Cholesterol: 232 mg/dL — ABNORMAL HIGH (ref 0–200)
HDL: 61.4 mg/dL (ref >39.00)
LDL Cholesterol: 156 mg/dL — ABNORMAL HIGH (ref 0–99)
NONHDL: 170.6
TRIGLYCERIDES: 71 mg/dL (ref 0.0–149.0)
VLDL: 14.2 mg/dL (ref 0.0–40.0)

## 2015-02-01 LAB — TSH: TSH: 1.13 u[IU]/mL (ref 0.35–4.50)

## 2015-02-01 LAB — VITAMIN D 25 HYDROXY (VIT D DEFICIENCY, FRACTURES): VITD: 35.56 ng/mL (ref 30.00–100.00)

## 2015-02-01 MED ORDER — DILTIAZEM HCL 60 MG PO TABS: 60.0000 mg | ORAL_TABLET | Freq: Four times a day (QID) | ORAL | Status: DC

## 2015-02-01 MED ORDER — ESOMEPRAZOLE MAGNESIUM 40 MG PO CPDR: 40.0000 mg | DELAYED_RELEASE_CAPSULE | Freq: Every day | ORAL | Status: DC

## 2015-02-01 NOTE — Progress Notes (Signed)
Pre visit review using our clinic review tool, if applicable. No additional management support is needed unless otherwise documented below in the visit note. 

## 2015-02-01 NOTE — Telephone Encounter (Signed)
Patient is due for Prolia injection in early august  Please order

## 2015-02-01 NOTE — Assessment & Plan Note (Signed)
Rare , last episode two months ago.   refill cardizem C

## 2015-02-01 NOTE — Patient Instructions (Signed)
recommend getting the majority of your calcium and Vitamin D  through diet rather than supplements given the recent association of calcium supplements with increased coronary artery calcium scores  You need 1800 mg of calcium daily due to your history of osteoporosis   Try the almond and cashew milks that most grocery stores  now carry  in the dairy  Section>   They are lactose free:  [employer] brand Almond Light,  Original formula.  Delicious,  Low carb,  Low cal,  Cholesterol free   Health Maintenance Adopting a healthy lifestyle and getting preventive care can go a long way to promote health and wellness. Talk with your health care provider about what schedule of regular examinations is right for you. This is a good chance for you to check in with your provider about disease prevention and staying healthy. In between checkups, there are plenty of things you can do on your own. Experts have done a lot of research about which lifestyle changes and preventive measures are most likely to keep you healthy. Ask your health care provider for more information. WEIGHT AND DIET  Eat a healthy diet  Be sure to include plenty of vegetables, fruits, low-fat dairy products, and lean protein.  Do not eat a lot of foods high in solid fats, added sugars, or salt.  Get regular exercise. This is one of the most important things you can do for your health.  Most adults should exercise for at least 150 minutes each week. The exercise should increase your heart rate and make you sweat (moderate-intensity exercise).  Most adults should also do strengthening exercises at least twice a week. This is in addition to the moderate-intensity exercise.  Maintain a healthy weight  Body mass index (BMI) is a measurement that can be used to identify possible weight problems. It estimates body fat based on height and weight. Your health care provider can help determine your BMI and help you achieve or maintain a healthy  weight.  For females 71 years of age and older:   A BMI below 18.5 is considered underweight.  A BMI of 18.5 to 24.9 is normal.  A BMI of 25 to 29.9 is considered overweight.  A BMI of 30 and above is considered obese.  Watch levels of cholesterol and blood lipids  You should start having your blood tested for lipids and cholesterol at 58 years of age, then have this test every 5 years.  You may need to have your cholesterol levels checked more often if:  Your lipid or cholesterol levels are high.  You are older than 58 years of age.  You are at high risk for heart disease.  CANCER SCREENING   Lung Cancer  Lung cancer screening is recommended for adults 55-32 years old who are at high risk for lung cancer because of a history of smoking.  A yearly low-dose CT scan of the lungs is recommended for people who:  Currently smoke.  Have quit within the past 15 years.  Have at least a 30-pack-year history of smoking. A pack year is smoking an average of one pack of cigarettes a day for 1 year.  Yearly screening should continue until it has been 15 years since you quit.  Yearly screening should stop if you develop a health problem that would prevent you from having lung cancer treatment.  Breast Cancer  Practice breast self-awareness. This means understanding how your breasts normally appear and feel.  It also means doing regular  breast self-exams. Let your health care provider know about any changes, no matter how small.  If you are in your 20s or 30s, you should have a clinical breast exam (CBE) by a health care provider every 1-3 years as part of a regular health exam.  If you are 59 or older, have a CBE every year. Also consider having a breast X-ray (mammogram) every year.  If you have a family history of breast cancer, talk to your health care provider about genetic screening.  If you are at high risk for breast cancer, talk to your health care provider about  having an MRI and a mammogram every year.  Breast cancer gene (BRCA) assessment is recommended for women who have family members with BRCA-related cancers. BRCA-related cancers include:  Breast.  Ovarian.  Tubal.  Peritoneal cancers.  Results of the assessment will determine the need for genetic counseling and BRCA1 and BRCA2 testing. Cervical Cancer Routine pelvic examinations to screen for cervical cancer are no longer recommended for nonpregnant women who are considered low risk for cancer of the pelvic organs (ovaries, uterus, and vagina) and who do not have symptoms. A pelvic examination may be necessary if you have symptoms including those associated with pelvic infections. Ask your health care provider if a screening pelvic exam is right for you.   The Pap test is the screening test for cervical cancer for women who are considered at risk.  If you had a hysterectomy for a problem that was not cancer or a condition that could lead to cancer, then you no longer need Pap tests.  If you are older than 65 years, and you have had normal Pap tests for the past 10 years, you no longer need to have Pap tests.  If you have had past treatment for cervical cancer or a condition that could lead to cancer, you need Pap tests and screening for cancer for at least 20 years after your treatment.  If you no longer get a Pap test, assess your risk factors if they change (such as having a new sexual partner). This can affect whether you should start being screened again.  Some women have medical problems that increase their chance of getting cervical cancer. If this is the case for you, your health care provider may recommend more frequent screening and Pap tests.  The human papillomavirus (HPV) test is another test that may be used for cervical cancer screening. The HPV test looks for the virus that can cause cell changes in the cervix. The cells collected during the Pap test can be tested for  HPV.  The HPV test can be used to screen women 22 years of age and older. Getting tested for HPV can extend the interval between normal Pap tests from three to five years.  An HPV test also should be used to screen women of any age who have unclear Pap test results.  After 58 years of age, women should have HPV testing as often as Pap tests.  Colorectal Cancer  This type of cancer can be detected and often prevented.  Routine colorectal cancer screening usually begins at 58 years of age and continues through 58 years of age.  Your health care provider may recommend screening at an earlier age if you have risk factors for colon cancer.  Your health care provider may also recommend using home test kits to check for hidden blood in the stool.  A small camera at the end of a tube can  be used to examine your colon directly (sigmoidoscopy or colonoscopy). This is done to check for the earliest forms of colorectal cancer.  Routine screening usually begins at age 31.  Direct examination of the colon should be repeated every 5-10 years through 58 years of age. However, you may need to be screened more often if early forms of precancerous polyps or small growths are found. Skin Cancer  Check your skin from head to toe regularly.  Tell your health care provider about any new moles or changes in moles, especially if there is a change in a mole's shape or color.  Also tell your health care provider if you have a mole that is larger than the size of a pencil eraser.  Always use sunscreen. Apply sunscreen liberally and repeatedly throughout the day.  Protect yourself by wearing long sleeves, pants, a wide-brimmed hat, and sunglasses whenever you are outside. HEART DISEASE, DIABETES, AND HIGH BLOOD PRESSURE   Have your blood pressure checked at least every 1-2 years. High blood pressure causes heart disease and increases the risk of stroke.  If you are between 10 years and 3 years old, ask  your health care provider if you should take aspirin to prevent strokes.  Have regular diabetes screenings. This involves taking a blood sample to check your fasting blood sugar level.  If you are at a normal weight and have a low risk for diabetes, have this test once every three years after 58 years of age.  If you are overweight and have a high risk for diabetes, consider being tested at a younger age or more often. PREVENTING INFECTION  Hepatitis B  If you have a higher risk for hepatitis B, you should be screened for this virus. You are considered at high risk for hepatitis B if:  You were born in a country where hepatitis B is common. Ask your health care provider which countries are considered high risk.  Your parents were born in a high-risk country, and you have not been immunized against hepatitis B (hepatitis B vaccine).  You have HIV or AIDS.  You use needles to inject street drugs.  You live with someone who has hepatitis B.  You have had sex with someone who has hepatitis B.  You get hemodialysis treatment.  You take certain medicines for conditions, including cancer, organ transplantation, and autoimmune conditions. Hepatitis C  Blood testing is recommended for:  Everyone born from [birth year] through 1964.  Anyone with known risk factors for hepatitis C. Sexually transmitted infections (STIs)  You should be screened for sexually transmitted infections (STIs) including gonorrhea and chlamydia if:  You are sexually active and are younger than 58 years of age.  You are older than 58 years of age and your health care provider tells you that you are at risk for this type of infection.  Your sexual activity has changed since you were last screened and you are at an increased risk for chlamydia or gonorrhea. Ask your health care provider if you are at risk.  If you do not have HIV, but are at risk, it may be recommended that you take a prescription medicine daily to  prevent HIV infection. This is called pre-exposure prophylaxis (PrEP). You are considered at risk if:  You are sexually active and do not regularly use condoms or know the HIV status of your partner(s).  You take drugs by injection.  You are sexually active with a partner who has HIV. Talk with your health  care provider about whether you are at high risk of being infected with HIV. If you choose to begin PrEP, you should first be tested for HIV. You should then be tested every 3 months for as long as you are taking PrEP.  PREGNANCY   If you are premenopausal and you may become pregnant, ask your health care provider about preconception counseling.  If you may become pregnant, take 400 to 800 micrograms (mcg) of folic acid every day.  If you want to prevent pregnancy, talk to your health care provider about birth control (contraception). OSTEOPOROSIS AND MENOPAUSE   Osteoporosis is a disease in which the bones lose minerals and strength with aging. This can result in serious bone fractures. Your risk for osteoporosis can be identified using a bone density scan.  If you are 16 years of age or older, or if you are at risk for osteoporosis and fractures, ask your health care provider if you should be screened.  Ask your health care provider whether you should take a calcium or vitamin D supplement to lower your risk for osteoporosis.  Menopause may have certain physical symptoms and risks.  Hormone replacement therapy may reduce some of these symptoms and risks. Talk to your health care provider about whether hormone replacement therapy is right for you.  HOME CARE INSTRUCTIONS   Schedule regular health, dental, and eye exams.  Stay current with your immunizations.   Do not use any tobacco products including cigarettes, chewing tobacco, or electronic cigarettes.  If you are pregnant, do not drink alcohol.  If you are breastfeeding, limit how much and how often you drink  alcohol.  Limit alcohol intake to no more than 1 drink per day for nonpregnant women. One drink equals 12 ounces of beer, 5 ounces of wine, or 1 ounces of hard liquor.  Do not use street drugs.  Do not share needles.  Ask your health care provider for help if you need support or information about quitting drugs.  Tell your health care provider if you often feel depressed.  Tell your health care provider if you have ever been abused or do not feel safe at home. Document Released: 01/28/2011 Document Revised: 11/29/2013 Document Reviewed: 06/16/2013 [facility] Patient Information 2015 [place], [place]. This information is not intended to replace advice given to you by your health care provider. Make sure you discuss any questions you have with your health care provider.

## 2015-02-01 NOTE — Progress Notes (Addendum)
Patient ID: Lauren PotashMona Tanner, female    DOB: 11-Dec-1955  Age: 59 y.o. MRN: [id number]  The patient is here for annual  wellness examination and management of other chronic and acute problems.   The risk factors are reflected in the social history.  The roster of all physicians providing medical care to patient - is listed in the Snapshot section of the chart.  Activities of daily living:  The patient is 100% independent in all ADLs: dressing, toileting, feeding as well as independent mobility  Home safety : The patient has smoke detectors in the home. They wear seatbelts.  There are no firearms at home. There is no violence in the home.   There is no risks for hepatitis, STDs or HIV. There is no   history of blood transfusion. They have no travel history to infectious disease endemic areas of the world.  The patient has seen their dentist in the last six month. They have seen their eye doctor in the last year. They admit to slight hearing difficulty with regard to whispered voices and some television programs.  They have deferred audiologic testing in the last year.  They do not  have excessive sun exposure. Discussed the need for sun protection: hats, long sleeves and use of sunscreen if there is significant sun exposure.   Diet: the importance of a healthy diet is discussed. They do have a healthy diet.  The benefits of regular aerobic exercise were discussed. She walks 4 times per week ,  20 minutes.   Depression screen: there are no signs or vegative symptoms of depression- irritability, change in appetite, anhedonia, sadness/tearfullness.  Cognitive assessment: the patient manages all their financial and personal affairs and is actively engaged. They could relate day,date,year and events; recalled 2/3 objects at 3 minutes; performed clock-face test normally.  The following portions of the patient's history were reviewed and updated as appropriate: allergies, current medications, past family  history, past medical history,  past surgical history, past social history  and problem list.  Visual acuity was not assessed per patient preference since she has regular follow up with her ophthalmologist. Hearing and body mass index were assessed and reviewed.   During the course of the visit the patient was educated and counseled about appropriate screening and preventive services including : fall prevention , diabetes screening, nutrition counseling, colorectal cancer screening, and recommended immunizations.    CC: The primary encounter diagnosis was Encounter for preventive health examination. Diagnoses of Other fatigue, Breast cancer screening, Hyperlipidemia, Vitamin D deficiency disease, SVT (supraventricular tachycardia), Osteoporosis, post-menopausal, Screening for colon cancer, and Family history of colon cancer requiring screening colonoscopy were also pertinent to this visit.  History Lauren Tanner has a past medical history of SVT (supraventricular tachycardia); Diverticulosis; Headache disorder; GERD (gastroesophageal reflux disease); Depression; and History of colonic polyps (2011).   She has no past surgical history on file.   Her family history includes Cancer (age of onset: 5465) in her father; Mental illness in her mother.She reports that she has never smoked. She has never used smokeless tobacco. She reports that she does not drink alcohol or use illicit drugs.  Outpatient Prescriptions Prior to Visit  Medication Sig Dispense Refill  . buPROPion (WELLBUTRIN XL) 300 MG 24 hr tablet Take 300 mg by mouth daily.    Marland Kitchen. denosumab (PROLIA) 60 MG/ML SOLN injection Inject 60 mg into the skin once. Administer in upper arm, thigh, or abdomen 1 Syringe 1  . estradiol (VAGIFEM) 25 MCG vaginal tablet  Place 1 tablet (25 mcg total) vaginally daily. For two weeks,  Then twice weekly thereafter 8 tablet 12  . imipramine (TOFRANIL) 50 MG tablet Take 50 mg by mouth 2 (two) times daily.    Marland Kitchen lamoTRIgine  (LAMICTAL) 100 MG tablet Take 100 mg by mouth 3 (three) times daily.    Marland Kitchen esomeprazole (NEXIUM) 40 MG capsule Take 1 capsule (40 mg total) by mouth daily before breakfast. 90 capsule 3  . azithromycin (ZITHROMAX) 250 MG tablet Take 2 tablets by mouth on the first day then 1 tablet by mouth for 4 days. 6 tablet 0  . chlorpheniramine-HYDROcodone (TUSSIONEX PENNKINETIC ER) 10-8 MG/5ML SUER Take 5 mLs by mouth at bedtime as needed for cough. 115 mL 0   No facility-administered medications prior to visit.    Review of Systems   Patient denies headache, fevers, malaise, unintentional weight loss, skin rash, eye pain, sinus congestion and sinus pain, sore throat, dysphagia,  hemoptysis , cough, dyspnea, wheezing, chest pain, palpitations, orthopnea, edema, abdominal pain, nausea, melena, diarrhea, constipation, flank pain, dysuria, hematuria, urinary  Frequency, nocturia, numbness, tingling, seizures,  Focal weakness, Loss of consciousness,  Tremor, insomnia, depression, anxiety, and suicidal ideation.      Objective:  BP 103/71 mmHg  Pulse 94  Temp(Src) 98.1 F (36.7 C) (Oral)  Ht 5' 5.5" (1.664 m)  Wt 121 lb 2 oz (54.942 kg)  BMI 19.84 kg/m2  SpO2 100%  Physical Exam   General appearance: alert, cooperative and appears stated age Head: Normocephalic, without obvious abnormality, atraumatic Eyes: conjunctivae/corneas clear. PERRL, EOM's intact. Fundi benign. Ears: normal TM's and external ear canals both ears Nose: Nares normal. Septum midline. Mucosa normal. No drainage or sinus tenderness. Throat: lips, mucosa, and tongue normal; teeth and gums normal Neck: no adenopathy, no carotid bruit, no JVD, supple, symmetrical, trachea midline and thyroid not enlarged, symmetric, no tenderness/mass/nodules Lungs: clear to auscultation bilaterally Breasts: normal appearance, no masses or tenderness Heart: regular rate and rhythm, S1, S2 normal, no murmur, click, rub or gallop Abdomen: soft,  non-tender; bowel sounds normal; no masses,  no organomegaly Extremities: extremities normal, atraumatic, no cyanosis or edema Pulses: 2+ and symmetric Skin: Skin color, texture, turgor normal. No rashes or lesions Neurologic: Alert and oriented X 3, normal strength and tone. Normal symmetric reflexes. Normal coordination and gait.    Assessment & Plan:   Problem List Items Addressed This Visit      Unprioritized   Osteoporosis, post-menopausal    Managed with twice annually Prolia . Her net dose is due March 01 2015.    Last DEXA was 2014. Discussed the current controversies surrounding the risks and benefits of dietary vs supplemental calcium  And encouraged her to increase dietary calcium through natural foods including [employer], almond/coconut milk, and dairy sources.         Family history of colon cancer requiring screening colonoscopy    She is due for 5 year follow up.      Encounter for preventive health examination - Primary    Annual wellness  exam was done as well as a comprehensive physical exam  .  During the course of the visit the patient was educated and counseled about appropriate screening and preventive services and screenings were brought up to date for cervical and breast cancer .  She will return for fasting labs to provide samples for diabetes screening and lipid analysis with projected  10 year  risk for CAD. nutrition counseling, skin cancer screening  has been recommended, along with review of the age appropriate recommended immunizations.  Printed recommendations for health maintenance screenings was given.        SVT (supraventricular tachycardia)    Rare , last episode two months ago.   refill cardizem C      Relevant Medications   diltiazem (CARDIZEM) 60 MG tablet    Other Visit Diagnoses    Other fatigue        Relevant Orders    CBC with Differential (Completed)    Comprehensive metabolic panel (Completed)    TSH (Completed)    Breast cancer  screening        Relevant Orders    Mammogram Digital Screening    Hyperlipidemia        Relevant Medications    diltiazem (CARDIZEM) 60 MG tablet    Other Relevant Orders    Lipid panel (Completed)    Vitamin D deficiency disease        Relevant Orders    Vit D  25 hydroxy (rtn osteoporosis monitoring) (Completed)    Screening for colon cancer        Relevant Orders    Ambulatory referral to Gastroenterology       I have discontinued Ms. Laidlaw's azithromycin and chlorpheniramine-HYDROcodone. I am also having her start on diltiazem. Additionally, I am having her maintain her lamoTRIgine, buPROPion, imipramine, estradiol, denosumab, and esomeprazole.  Meds ordered this encounter  Medications  . esomeprazole (NEXIUM) 40 MG capsule    Sig: Take 1 capsule (40 mg total) by mouth daily before breakfast.    Dispense:  90 capsule    Refill:  3    Dispense only TEVA manufacture  . diltiazem (CARDIZEM) 60 MG tablet    Sig: Take 1 tablet (60 mg total) by mouth 4 (four) times daily.    Dispense:  30 tablet    Refill:  0    Medications Discontinued During This Encounter  Medication Reason  . azithromycin (ZITHROMAX) 250 MG tablet Completed Course  . chlorpheniramine-HYDROcodone (TUSSIONEX PENNKINETIC ER) 10-8 MG/5ML SUER Completed Course  . esomeprazole (NEXIUM) 40 MG capsule Reorder    Follow-up: Return in 6 months (on 08/04/2015).   Sherlene Shams, MD

## 2015-02-02 LAB — CBC WITH DIFFERENTIAL/PLATELET
BASOS ABS: 0 10*3/uL (ref 0.0–0.1)
Basophils Relative: 0.3 % (ref 0.0–3.0)
EOS PCT: 0.1 % (ref 0.0–5.0)
Eosinophils Absolute: 0 10*3/uL (ref 0.0–0.7)
HEMATOCRIT: 35.7 % — AB (ref 36.0–46.0)
Hemoglobin: 12.1 g/dL (ref 12.0–15.0)
Lymphocytes Relative: 36.5 % (ref 12.0–46.0)
Lymphs Abs: 1.9 10*3/uL (ref 0.7–4.0)
MCHC: 33.9 g/dL (ref 30.0–36.0)
MCV: 94.5 fl (ref 78.0–100.0)
MONO ABS: 0.3 10*3/uL (ref 0.1–1.0)
Monocytes Relative: 5.9 % (ref 3.0–12.0)
Neutro Abs: 3 10*3/uL (ref 1.4–7.7)
Neutrophils Relative %: 57.2 % (ref 43.0–77.0)
Platelets: 270 10*3/uL (ref 150.0–400.0)
RBC: 3.78 Mil/uL — ABNORMAL LOW (ref 3.87–5.11)
RDW: 13.4 % (ref 11.5–15.5)
WBC: 5.3 10*3/uL (ref 4.0–10.5)

## 2015-02-02 NOTE — Telephone Encounter (Signed)
FYI

## 2015-02-02 NOTE — Telephone Encounter (Signed)
I have electronically submitted pt's info for Prolia insurnace verification and will notify you once I have a response. Thank you.

## 2015-02-03 ENCOUNTER — Encounter: Payer: Self-pay | Admitting: Internal Medicine

## 2015-02-03 NOTE — Addendum Note (Signed)
Addended by: Sherlene ShamsULLO, Briar Witherspoon L on: 02/03/2015 04:59 PM   Modules accepted: Orders

## 2015-02-03 NOTE — Assessment & Plan Note (Signed)

## 2015-02-03 NOTE — Assessment & Plan Note (Addendum)
Managed with twice annually Prolia . Her net dose is due March 01 2015.    Last DEXA was 2014. Discussed the current controversies surrounding the risks and benefits of dietary vs supplemental calcium  And encouraged her to increase dietary calcium through natural foods including [employer], almond/coconut milk, and dairy sources.

## 2015-02-03 NOTE — Assessment & Plan Note (Signed)
She is due for 5 year follow up 

## 2015-02-04 ENCOUNTER — Encounter: Payer: Self-pay | Admitting: Internal Medicine

## 2015-02-09 ENCOUNTER — Ambulatory Visit: Payer: BLUE CROSS/BLUE SHIELD | Admitting: *Deleted

## 2015-02-13 NOTE — Telephone Encounter (Signed)
I have rec'd Ms. Kotz' Engineer, manufacturing systemsinsurance verification for Proila and her BCBS plan is requiring a prior authorization.  I have completed the top section of the form, but either you or Dr. Darrick Huntsmanullo will need to answer questions 1-3, then Dr. Darrick Huntsmanullo will need to sign/date at the bottom.  I have faxed the form to your attn.  Once form is complete you can fax back to my attn at [phone]. Thank you.

## 2015-02-13 NOTE — Telephone Encounter (Signed)
In red folder. 

## 2015-02-14 LAB — HM MAMMOGRAPHY

## 2015-02-14 NOTE — Telephone Encounter (Signed)
[facility] 7/19 [facility]

## 2015-02-15 ENCOUNTER — Encounter: Payer: Self-pay | Admitting: Internal Medicine

## 2015-02-15 NOTE — Telephone Encounter (Signed)
Form received and faxed to [employer].

## 2015-02-28 NOTE — Telephone Encounter (Signed)
I have rec'd pt's insurance verification and prior auth for Prolia.  The prior auth is effective 03/09/2015-03/08/2016 w/a ref #[id number].  Whether an OV is billed or not pt has an estimated an estimated responsibility of a $40 co-pay which will cover cost of Prolia, admin, and OV if billed.  Please make pt aware this is an estimate and we won't know an exact amt until her insurance has paid. I have sent a copy of the summary of benefits and the prior auth to be scanned into her chart.  Prolia does have a $25 co-pay card which would save Lauren Tanner a little bit of money.  If she is interested I can send you a brochure which contains the co-pay card and some info.  I believe she will have to take a hard copy prescription to a pharmacy to receive her injection, but she can call Prolia at [phone] and select option #1 for any questions.  Let me know if you want me to send the info to you.  If you have any questions please let me know. Thank you.

## 2015-03-01 ENCOUNTER — Encounter: Payer: Self-pay | Admitting: *Deleted

## 2015-03-01 NOTE — Telephone Encounter (Signed)
Attempted to call patient, Left message for Patient to return my call.

## 2015-03-08 NOTE — Telephone Encounter (Signed)
Patient scheduled for injection 03/09/15 per Nitchia.

## 2015-03-09 ENCOUNTER — Ambulatory Visit (INDEPENDENT_AMBULATORY_CARE_PROVIDER_SITE_OTHER): Payer: BLUE CROSS/BLUE SHIELD

## 2015-03-09 DIAGNOSIS — M810 Age-related osteoporosis without current pathological fracture: Secondary | ICD-10-CM

## 2015-03-09 MED ORDER — DENOSUMAB 60 MG/ML ~~LOC~~ SOLN
60.0000 mg | Freq: Once | SUBCUTANEOUS | Status: AC
  Administered 2015-03-09: 60 mg via SUBCUTANEOUS

## 2015-05-18 ENCOUNTER — Encounter: Payer: Self-pay | Admitting: *Deleted

## 2015-05-19 ENCOUNTER — Encounter
Admission: RE | Disposition: A | Discharge status: Home / Self Care | Payer: Self-pay | Source: Ambulatory Visit | Attending: Unknown Physician Specialty

## 2015-05-19 ENCOUNTER — Encounter: Payer: Self-pay | Admitting: Anesthesiology

## 2015-05-19 ENCOUNTER — Ambulatory Visit: Payer: BLUE CROSS/BLUE SHIELD | Admitting: Anesthesiology

## 2015-05-19 ENCOUNTER — Ambulatory Visit
Admission: RE | Admit: 2015-05-19 | Discharge: 2015-05-19 | Disposition: A | Discharge status: Home / Self Care | Payer: BLUE CROSS/BLUE SHIELD | Source: Ambulatory Visit | Attending: Unknown Physician Specialty | Admitting: Unknown Physician Specialty

## 2015-05-19 DIAGNOSIS — Z800 Family history of malignant neoplasm of digestive organs: Secondary | ICD-10-CM | POA: Diagnosis not present

## 2015-05-19 DIAGNOSIS — K640 First degree hemorrhoids: Secondary | ICD-10-CM | POA: Diagnosis not present

## 2015-05-19 DIAGNOSIS — Z86010 Personal history of colonic polyps: Secondary | ICD-10-CM | POA: Insufficient documentation

## 2015-05-19 DIAGNOSIS — Z79899 Other long term (current) drug therapy: Secondary | ICD-10-CM | POA: Diagnosis not present

## 2015-05-19 DIAGNOSIS — F329 Major depressive disorder, single episode, unspecified: Secondary | ICD-10-CM | POA: Insufficient documentation

## 2015-05-19 DIAGNOSIS — Z1211 Encounter for screening for malignant neoplasm of colon: Secondary | ICD-10-CM | POA: Diagnosis not present

## 2015-05-19 DIAGNOSIS — Z881 Allergy status to other antibiotic agents status: Secondary | ICD-10-CM | POA: Diagnosis not present

## 2015-05-19 DIAGNOSIS — K219 Gastro-esophageal reflux disease without esophagitis: Secondary | ICD-10-CM | POA: Insufficient documentation

## 2015-05-19 HISTORY — PX: COLONOSCOPY WITH PROPOFOL: SHX5780

## 2015-05-19 HISTORY — DX: Cardiac arrhythmia, unspecified: I49.9

## 2015-05-19 LAB — HM COLONOSCOPY

## 2015-05-19 SURGERY — COLONOSCOPY WITH PROPOFOL
Anesthesia: General

## 2015-05-19 MED ORDER — PHENYLEPHRINE HCL 10 MG/ML IJ SOLN
INTRAMUSCULAR | Status: DC | PRN
  Administered 2015-05-19 (×3): 100 ug via INTRAVENOUS

## 2015-05-19 MED ORDER — PROPOFOL 10 MG/ML IV BOLUS
INTRAVENOUS | Status: DC | PRN
  Administered 2015-05-19: 20 mg via INTRAVENOUS
  Administered 2015-05-19: 30 mg via INTRAVENOUS
  Administered 2015-05-19 (×2): 20 mg via INTRAVENOUS
  Administered 2015-05-19: 30 mg via INTRAVENOUS

## 2015-05-19 MED ORDER — MIDAZOLAM HCL 5 MG/5ML IJ SOLN
INTRAMUSCULAR | Status: DC | PRN
Start: 2015-05-19 — End: 2015-05-19
  Administered 2015-05-19: 1 mg via INTRAVENOUS

## 2015-05-19 MED ORDER — FENTANYL CITRATE (PF) 100 MCG/2ML IJ SOLN
INTRAMUSCULAR | Status: DC | PRN
  Administered 2015-05-19: 50 ug via INTRAVENOUS

## 2015-05-19 MED ORDER — LIDOCAINE HCL (PF) 2 % IJ SOLN
INTRAMUSCULAR | Status: DC | PRN
  Administered 2015-05-19: 40 mg

## 2015-05-19 MED ORDER — SODIUM CHLORIDE 0.9 % IV SOLN
INTRAVENOUS | Status: DC
  Administered 2015-05-19: 1000 mL via INTRAVENOUS

## 2015-05-19 MED ORDER — PROPOFOL 500 MG/50ML IV EMUL
INTRAVENOUS | Status: DC | PRN
  Administered 2015-05-19: 100 ug/kg/min via INTRAVENOUS

## 2015-05-19 MED ORDER — SODIUM CHLORIDE 0.9 % IV SOLN: INTRAVENOUS | Status: DC

## 2015-05-19 NOTE — Transfer of Care (Signed)
Immediate Anesthesia Transfer of Care Note  Patient: Lauren PotashMona Tanner  Procedure(s) Performed: Procedure(s): COLONOSCOPY WITH PROPOFOL (N/A)  Patient Location: PACU  Anesthesia Type:General  Level of Consciousness: sedated  Airway & Oxygen Therapy: Patient Spontanous Breathing and Patient connected to nasal cannula oxygen  Post-op Assessment: Report given to RN and Post -op Vital signs reviewed and stable  Post vital signs: Reviewed and stable  Last Vitals:  Filed Vitals:   05/19/15 1004  BP: 130/88  Pulse: 92  Temp: 36.3 C  Resp: 20    Complications: No apparent anesthesia complications

## 2015-05-19 NOTE — H&P (Signed)
Primary Care Physician:  Sherlene Shams, MD Primary Gastroenterologist:  Dr. Mechele Collin  Pre-Procedure History & Physical: HPI:  Lauren Tanner is a 59 y.o. female is here for an colonoscopy.   Past Medical History  Diagnosis Date  . SVT (supraventricular tachycardia) (HCC)     normal screening ER workup  . Diverticulosis   . Headache disorder     managed with imipramine  . GERD (gastroesophageal reflux disease)   . Depression     no history of hospitalizations  . History of colonic polyps 2011    last colonoscopy, [facility]  . Dysrhythmia     History reviewed. No pertinent past surgical history.  Prior to Admission medications   Medication Sig Start Date End Date Taking? Authorizing Provider  buPROPion (WELLBUTRIN XL) 300 MG 24 hr tablet Take 300 mg by mouth daily.   Yes Historical Provider, MD  denosumab (PROLIA) 60 MG/ML SOLN injection Inject 60 mg into the skin once. Administer in upper arm, thigh, or abdomen 12/01/12  Yes Sherlene Shams, MD  diltiazem (CARDIZEM) 60 MG tablet Take 1 tablet (60 mg total) by mouth 4 (four) times daily. 02/01/15  Yes Sherlene Shams, MD  esomeprazole (NEXIUM) 40 MG capsule Take 1 capsule (40 mg total) by mouth daily before breakfast. 02/01/15  Yes Sherlene Shams, MD  estradiol (VAGIFEM) 25 MCG vaginal tablet Place 1 tablet (25 mcg total) vaginally daily. For two weeks,  Then twice weekly thereafter 11/17/12  Yes Sherlene Shams, MD  imipramine (TOFRANIL) 50 MG tablet Take 50 mg by mouth 2 (two) times daily.   Yes Historical Provider, MD  lamoTRIgine (LAMICTAL) 100 MG tablet Take 100 mg by mouth 3 (three) times daily.   Yes Historical Provider, MD    Allergies as of 04/10/2015 - Review Complete 02/01/2015  Allergen Reaction Noted  . Ampicillin Hives 10/11/2011  . Septra [bactrim] Hives 10/11/2011    Family History  Problem Relation Age of Onset  . Mental illness Mother   . Cancer Father 46    colon Ca,  bladder ca    Social History    Social History  . Marital Status: Married    Spouse Name: N/A  . Number of Children: N/A  . Years of Education: N/A   Occupational History  . Not on file.   Social History Main Topics  . Smoking status: Never Smoker   . Smokeless tobacco: Never Used  . Alcohol Use: No  . Drug Use: No  . Sexual Activity: Not on file   Other Topics Concern  . Not on file   Social History Narrative    Review of Systems: See HPI, otherwise negative ROS  Physical Exam: BP 130/88 mmHg  Pulse 92  Temp(Src) 97.4 F (36.3 C) (Tympanic)  Resp 20  Ht  (1.702 m)  Wt 54.432 kg (120 lb)  BMI 18.79 kg/m2  SpO2 100% General:   Alert,  pleasant and cooperative in NAD Head:  Normocephalic and atraumatic. Neck:  Supple; no masses or thyromegaly. Lungs:  Clear throughout to auscultation.    Heart:  Regular rate and rhythm. Abdomen:  Soft, nontender and nondistended. Normal bowel sounds, without guarding, and without rebound.   Neurologic:  Alert and  oriented x4;  grossly normal neurologically.  Impression/Plan: Lauren Tanner is here for an colonoscopy to be performed for family history of colon cancer in father.  Risks, benefits, limitations, and alternatives regarding  colonoscopy have been reviewed with the patient.  Questions have been answered.  All parties agreeable.   Lynnae PrudeELLIOTT, ROBERT, MD  05/19/2015, 10:14 AM

## 2015-05-19 NOTE — Anesthesia Preprocedure Evaluation (Signed)
Anesthesia Evaluation  Patient identified by MRN, date of birth, ID band Patient awake    Reviewed: Allergy & Precautions, H&P , NPO status , Patient's Chart, lab work & pertinent test results, reviewed documented beta blocker date and time   History of Anesthesia Complications Negative for: history of anesthetic complications  Airway Mallampati: II  TM Distance: >3 FB Neck ROM: full    Dental no notable dental hx. (+) Teeth Intact Permanent bridge top left:   Pulmonary neg pulmonary ROS,    Pulmonary exam normal breath sounds clear to auscultation       Cardiovascular Exercise Tolerance: Good (-) hypertension(-) angina(-) CAD, (-) Past MI, (-) Cardiac Stents and (-) CABG Normal cardiovascular exam+ dysrhythmias Supra Ventricular Tachycardia (-) Valvular Problems/Murmurs Rhythm:regular Rate:Normal     Neuro/Psych PSYCHIATRIC DISORDERS (depression) negative neurological ROS     GI/Hepatic Neg liver ROS, GERD  Medicated and Controlled,  Endo/Other  negative endocrine ROS  Renal/GU negative Renal ROS  negative genitourinary   Musculoskeletal   Abdominal   Peds  Hematology negative hematology ROS (+)   Anesthesia Other Findings Past Medical History:   SVT (supraventricular tachycardia) (HCC)                       Comment:normal screening ER workup   Diverticulosis                                               Headache disorder                                              Comment:managed with imipramine   GERD (gastroesophageal reflux disease)                       Depression                                                     Comment:no history of hospitalizations   History of colonic polyps                       2011           Comment:last colonoscopy, [facility]   Dysrhythmia                                                  Reproductive/Obstetrics negative OB ROS                              Anesthesia Physical Anesthesia Plan  ASA: II  Anesthesia Plan: General   Post-op Pain Management:    Induction:   Airway Management Planned:   Additional Equipment:   Intra-op Plan:   Post-operative Plan:   Informed Consent: I have reviewed the patients History and Physical, chart, labs and discussed the procedure including the risks, benefits and alternatives for the  proposed anesthesia with the patient or authorized representative who has indicated his/her understanding and acceptance.   Dental Advisory Given  Plan Discussed with: Anesthesiologist, CRNA and Surgeon  Anesthesia Plan Comments:         Anesthesia Quick Evaluation

## 2015-05-19 NOTE — Op Note (Signed)
[facility] Gastroenterology Patient Name: Lauren Tanner Procedure Date: 05/19/2015 10:12 AM MRN: [id number] Account #: [id number] Date of Birth: [birth year] Admit Type: Outpatient Age: 59 Room: [facility] ENDO ROOM 1 Gender: Female Note Status: Finalized Procedure:         Colonoscopy Indications:       Screening in patient at increased risk: Family history of                     1st-degree relative with colorectal cancer Providers:         Scot Jun, MD Referring MD:      Duncan Dull, MD (Referring MD) Medicines:         Propofol per Anesthesia Complications:     No immediate complications. Procedure:         Pre-Anesthesia Assessment:                    - After reviewing the risks and benefits, the patient was                     deemed in satisfactory condition to undergo the procedure.                    After obtaining informed consent, the colonoscope was                     passed under direct vision. Throughout the procedure, the                     patient's blood pressure, pulse, and oxygen saturations                     were monitored continuously. The Olympus PCF-H180AL                     colonoscope ( S#: [id number] ) was introduced through the                     anus and advanced to the the cecum, identified by                     appendiceal orifice and ileocecal valve. The colonoscopy                     was performed with difficulty due to restricted mobility                     of the colon, significant looping and a tortuous colon.                     Successful completion of the procedure was aided by                     applying abdominal pressure. The patient tolerated the                     procedure well. The quality of the bowel preparation was                     adequate to identify polyps. Findings:      Internal hemorrhoids were found during endoscopy. The hemorrhoids were       small and Grade I (internal hemorrhoids that do not  prolapse). The prep       was  fair in some areas and a lot of lavage using 3L of fluid to wash the       colon wall to see the lining and to clean the lumen enough to advance       the scope. No polyps were seen anywhere in the colon. Impression:        - Internal hemorrhoids.                    - No specimens collected. Recommendation:    - Repeat colonoscopy in 5 years for screening purposes. Do                     extra prep at that time. Scot Junobert T Elliott, MD 05/19/2015 11:09:27 AM This report has been signed electronically. Number of Addenda: 0 Note Initiated On: 05/19/2015 10:12 AM Scope Withdrawal Time: 0 hours 11 minutes 0 seconds  Total Procedure Duration: 0 hours 39 minutes 39 seconds       [facility]

## 2015-05-22 NOTE — Anesthesia Postprocedure Evaluation (Signed)
  Anesthesia Post-op Note  Patient: Lauren Tanner  Procedure(s) Performed: Procedure(s): COLONOSCOPY WITH PROPOFOL (N/A)  Anesthesia type:General  Patient location: PACU  Post pain: Pain level controlled  Post assessment: Post-op Vital signs reviewed, Patient's Cardiovascular Status Stable, Respiratory Function Stable, Patent Airway and No signs of Nausea or vomiting  Post vital signs: Reviewed and stable  Last Vitals:  Filed Vitals:   05/19/15 1140  BP: 124/91  Pulse: 77  Temp:   Resp: 14    Level of consciousness: awake, alert  and patient cooperative  Complications: No apparent anesthesia complications

## 2015-05-25 ENCOUNTER — Encounter: Payer: Self-pay | Admitting: Unknown Physician Specialty

## 2015-05-31 ENCOUNTER — Encounter: Payer: Self-pay | Admitting: Internal Medicine

## 2015-07-14 ENCOUNTER — Telehealth: Payer: Self-pay | Admitting: Internal Medicine

## 2015-07-14 NOTE — Telephone Encounter (Signed)
I attempted to call her back, left a VM.

## 2015-07-14 NOTE — Telephone Encounter (Signed)
Caller name: Abby PotashMona Decarli Relationship to patient: self Can be reached: [phone]  Reason for call:  Pt is travel to [place] on Wednesday and request for rx of valium. Please advise pt/msn

## 2015-07-14 NOTE — Telephone Encounter (Signed)
Please advise 

## 2015-07-14 NOTE — Telephone Encounter (Signed)
That type of request is not handled without more information.  Has patient ever taken valium before?  Or alprazolam?  i have no history of prescribing them to her  I see she was prescribed clonazepam 3 years ago, need to know how she tolerated it.

## 2015-07-14 NOTE — Telephone Encounter (Signed)
Spoke with the patient, she has never taken Valium or xanax.  She took the Clonazepam 3 years ago and it would just put her to sleep.    Please advise?

## 2015-07-14 NOTE — Telephone Encounter (Signed)
Pt is calling Tanya back. She has more information for you. Please call her back.

## 2015-07-18 MED ORDER — DIAZEPAM 5 MG PO TABS: 5.0000 mg | ORAL_TABLET | Freq: Two times a day (BID) | ORAL | Status: DC | PRN

## 2015-07-18 NOTE — Telephone Encounter (Signed)
Valium sent to pharmacy 5 mg tonight

## 2015-07-18 NOTE — Telephone Encounter (Signed)
Patient stated she took clonazepam 3 years ago and it made her sleepy. Please advise medication request for valium or xanax due to patient out of town tomorrow.

## 2015-10-02 ENCOUNTER — Encounter: Payer: Self-pay | Admitting: Internal Medicine

## 2015-10-02 ENCOUNTER — Ambulatory Visit (INDEPENDENT_AMBULATORY_CARE_PROVIDER_SITE_OTHER): Payer: BLUE CROSS/BLUE SHIELD | Admitting: Internal Medicine

## 2015-10-02 VITALS — BP 128/82 | HR 85 | Temp 98.1°F | Resp 12 | Ht 67.0 in | Wt 127.0 lb

## 2015-10-02 DIAGNOSIS — K219 Gastro-esophageal reflux disease without esophagitis: Secondary | ICD-10-CM

## 2015-10-02 MED ORDER — FAMOTIDINE 40 MG PO TABS: 40.0000 mg | ORAL_TABLET | Freq: Every day | ORAL | Status: DC

## 2015-10-02 NOTE — Patient Instructions (Addendum)
  I appreciate your concern about stopping your PPI in light of the recently published studies suggesting an association with increased risk of dementia and kidney failure.  I advise you to try switching  From your PPI to either famotidine 40  mg (1/2 to 1 tablet ) once  daily,  or to ranitidine 150 mg once or twice daily.       if your reflux symptoms are not controlled,  Resume a PPI for as short a time as possible  (  You can use OTC Prilosec)  .

## 2015-10-02 NOTE — Progress Notes (Signed)
Pre-visit discussion using our clinic review tool. No additional management support is needed unless otherwise documented below in the visit note.  

## 2015-10-02 NOTE — Progress Notes (Signed)
Subjective:  Patient ID: Lauren Tanner, female    DOB: [birth year]  Age: 60 y.o. MRN: [id number]  CC: The encounter diagnosis was Gastroesophageal reflux disease without esophagitis.  HPI Lauren Tanner presents for > 6 month follow up on chronic conditions. He has a history of GERD , managed for years with PPI Nexium. But is requesting to stop nexium based on concerns about safety of longterm  PPI use and effect on cognition.    She has no history of Barrett's Esophagus or peptic ulcer disease.  Her symptoms occur intermittently  And are brought on by eating late at night and eating spicy foods.  Has not tried pepcid or zantac.yet.  Outpatient Prescriptions Prior to Visit  Medication Sig Dispense Refill  . buPROPion (WELLBUTRIN XL) 300 MG 24 hr tablet Take 300 mg by mouth daily.    Marland Kitchen denosumab (PROLIA) 60 MG/ML SOLN injection Inject 60 mg into the skin once. Administer in upper arm, thigh, or abdomen 1 Syringe 1  . diltiazem (CARDIZEM) 60 MG tablet Take 1 tablet (60 mg total) by mouth 4 (four) times daily. 30 tablet 0  . estradiol (VAGIFEM) 25 MCG vaginal tablet Place 1 tablet (25 mcg total) vaginally daily. For two weeks,  Then twice weekly thereafter 8 tablet 12  . imipramine (TOFRANIL) 50 MG tablet Take 50 mg by mouth 2 (two) times daily.    Marland Kitchen lamoTRIgine (LAMICTAL) 100 MG tablet Take 100 mg by mouth 3 (three) times daily.    . diazepam (VALIUM) 5 MG tablet Take 1 tablet (5 mg total) by mouth every 12 (twelve) hours as needed for anxiety. (Patient not taking: Reported on 10/02/2015) 10 tablet 1  . esomeprazole (NEXIUM) 40 MG capsule Take 1 capsule (40 mg total) by mouth daily before breakfast. (Patient not taking: Reported on 10/02/2015) 90 capsule 3   No facility-administered medications prior to visit.    Review of Systems;  Patient denies headache, fevers, malaise, unintentional weight loss, skin rash, eye pain, sinus congestion and sinus pain, sore throat, dysphagia,  hemoptysis , cough,  dyspnea, wheezing, chest pain, palpitations, orthopnea, edema, abdominal pain, nausea, melena, diarrhea, constipation, flank pain, dysuria, hematuria, urinary  Frequency, nocturia, numbness, tingling, seizures,  Focal weakness, Loss of consciousness,  Tremor, insomnia, depression, anxiety, and suicidal ideation.      Objective:  BP 128/82 mmHg  Pulse 85  Temp(Src) 98.1 F (36.7 C) (Oral)  Resp 12  Ht  (1.702 m)  Wt 127 lb (57.607 kg)  BMI 19.89 kg/m2  SpO2 99%  BP Readings from Last 3 Encounters:  10/02/15 128/82  05/19/15 124/91  02/01/15 103/71    Wt Readings from Last 3 Encounters:  10/02/15 127 lb (57.607 kg)  05/19/15 120 lb (54.432 kg)  02/01/15 121 lb 2 oz (54.942 kg)    General appearance: alert, cooperative and appears stated age Ears: normal TM's and external ear canals both ears Throat: lips, mucosa, and tongue normal; teeth and gums normal Neck: no adenopathy, no carotid bruit, supple, symmetrical, trachea midline and thyroid not enlarged, symmetric, no tenderness/mass/nodules Back: symmetric, no curvature. ROM normal. No CVA tenderness. Lungs: clear to auscultation bilaterally Heart: regular rate and rhythm, S1, S2 normal, no murmur, click, rub or gallop Abdomen: soft, non-tender; bowel sounds normal; no masses,  no organomegaly Pulses: 2+ and symmetric Skin: Skin color, texture, turgor normal. No rashes or lesions Lymph nodes: Cervical, supraclavicular, and axillary nodes normal.  No results found for: HGBA1C  Lab Results  Component Value  Date   CREATININE 0.97 02/01/2015   CREATININE 0.9 01/20/2014   CREATININE 0.9 11/17/2012    Lab Results  Component Value Date   WBC 5.3 02/01/2015   HGB 12.1 02/01/2015   HCT 35.7* 02/01/2015   PLT 270.0 02/01/2015   GLUCOSE 54* 02/01/2015   CHOL 232* 02/01/2015   TRIG 71.0 02/01/2015   HDL 61.40 02/01/2015   LDLDIRECT 135.9 11/17/2012   LDLCALC 156* 02/01/2015   ALT 45* 02/01/2015   AST 35 02/01/2015     NA 139 02/01/2015   K 4.6 02/01/2015   CL 103 02/01/2015   CREATININE 0.97 02/01/2015   BUN 21 02/01/2015   CO2 29 02/01/2015   TSH 1.13 02/01/2015    No results found.  Assessment & Plan:   Problem List Items Addressed This Visit    GERD (gastroesophageal reflux disease) - Primary    The risks of long term PPI use for acid suppression in patients without documented Barretts esophagus were discussed, including the possibility of osteoporosis, iron , B12 and magnesium deficiencies,  CKD, and dementia. Suggested trial of pepcid 20 mg daily or twice daily  and if GERD symptoms return,  To resume daily PPI and  referral for EGD.        Relevant Medications   famotidine (PEPCID) 40 MG tablet      I have discontinued Lauren Tanner's esomeprazole. I am also having her start on famotidine. Additionally, I am having her maintain her lamoTRIgine, buPROPion, imipramine, estradiol, denosumab, diltiazem, and diazepam.  Meds ordered this encounter  Medications  . famotidine (PEPCID) 40 MG tablet    Sig: Take 1 tablet (40 mg total) by mouth daily.    Dispense:  90 tablet    Refill:  1    Medications Discontinued During This Encounter  Medication Reason  . esomeprazole (NEXIUM) 40 MG capsule     Follow-up: No Follow-up on file.   Sherlene ShamsULLO, Jakala Herford L, MD

## 2015-10-04 NOTE — Assessment & Plan Note (Signed)
The risks of long term PPI use for acid suppression in patients without documented Barretts esophagus were discussed, including the possibility of osteoporosis, iron , B12 and magnesium deficiencies,  CKD, and dementia. Suggested trial of pepcid 20 mg daily or twice daily  and if GERD symptoms return,  To resume daily PPI and  referral for EGD.

## 2015-11-07 DIAGNOSIS — M9901 Segmental and somatic dysfunction of cervical region: Secondary | ICD-10-CM | POA: Diagnosis not present

## 2015-11-07 DIAGNOSIS — M9902 Segmental and somatic dysfunction of thoracic region: Secondary | ICD-10-CM | POA: Diagnosis not present

## 2015-11-07 DIAGNOSIS — G44209 Tension-type headache, unspecified, not intractable: Secondary | ICD-10-CM | POA: Diagnosis not present

## 2015-11-07 DIAGNOSIS — M542 Cervicalgia: Secondary | ICD-10-CM | POA: Diagnosis not present

## 2015-11-13 DIAGNOSIS — G518 Other disorders of facial nerve: Secondary | ICD-10-CM | POA: Diagnosis not present

## 2015-11-13 DIAGNOSIS — M791 Myalgia: Secondary | ICD-10-CM | POA: Diagnosis not present

## 2015-11-13 DIAGNOSIS — G43719 Chronic migraine without aura, intractable, without status migrainosus: Secondary | ICD-10-CM | POA: Diagnosis not present

## 2015-11-13 DIAGNOSIS — M542 Cervicalgia: Secondary | ICD-10-CM | POA: Diagnosis not present

## 2015-12-05 DIAGNOSIS — M9901 Segmental and somatic dysfunction of cervical region: Secondary | ICD-10-CM | POA: Diagnosis not present

## 2015-12-05 DIAGNOSIS — M9902 Segmental and somatic dysfunction of thoracic region: Secondary | ICD-10-CM | POA: Diagnosis not present

## 2015-12-05 DIAGNOSIS — M542 Cervicalgia: Secondary | ICD-10-CM | POA: Diagnosis not present

## 2015-12-05 DIAGNOSIS — G44209 Tension-type headache, unspecified, not intractable: Secondary | ICD-10-CM | POA: Diagnosis not present

## 2015-12-15 DIAGNOSIS — H1013 Acute atopic conjunctivitis, bilateral: Secondary | ICD-10-CM | POA: Diagnosis not present

## 2015-12-15 DIAGNOSIS — G43719 Chronic migraine without aura, intractable, without status migrainosus: Secondary | ICD-10-CM | POA: Diagnosis not present

## 2015-12-27 DIAGNOSIS — G43719 Chronic migraine without aura, intractable, without status migrainosus: Secondary | ICD-10-CM | POA: Diagnosis not present

## 2016-01-02 DIAGNOSIS — M542 Cervicalgia: Secondary | ICD-10-CM | POA: Diagnosis not present

## 2016-01-02 DIAGNOSIS — M9902 Segmental and somatic dysfunction of thoracic region: Secondary | ICD-10-CM | POA: Diagnosis not present

## 2016-01-02 DIAGNOSIS — M9901 Segmental and somatic dysfunction of cervical region: Secondary | ICD-10-CM | POA: Diagnosis not present

## 2016-01-02 DIAGNOSIS — G44209 Tension-type headache, unspecified, not intractable: Secondary | ICD-10-CM | POA: Diagnosis not present

## 2016-01-08 ENCOUNTER — Encounter: Payer: Self-pay | Admitting: Internal Medicine

## 2016-01-08 ENCOUNTER — Telehealth: Payer: Self-pay

## 2016-01-08 NOTE — Telephone Encounter (Signed)
Need authorization for prolia. Thanks Lauren Tanner, FYI Tanya

## 2016-01-08 NOTE — Telephone Encounter (Signed)
PROLIA authorization needed. thanks

## 2016-01-10 NOTE — Telephone Encounter (Signed)
I have electronically submitted pt's info for Prolia insurance verification and will notify you once I have a response. Thank you. °

## 2016-01-15 NOTE — Telephone Encounter (Signed)
I have rec'd insurance verification for Prolia and [employer] has required a prior auth, but there is still once on file and is effective 03/09/2015-03/08/2016 w/a ref #[id number].  Ms. Lauren Tanner' estimated responsibility w/out an OV will be $0; w/an OV it will be $25.  Please make pt aware this is an estimate and we will not know an exact amt until insurance(s) has/have paid.  I have sent a copy of the summary of benefits to be scanned into pt's chart.    Once pt recs injection, please let me know actual injection date so I can update the Prolia portal.  If you have any questions, please let me know.

## 2016-01-15 NOTE — Telephone Encounter (Signed)
Left patient a VM to return my call. thanks

## 2016-01-16 NOTE — Telephone Encounter (Signed)
Spoke with patient and scheduled Nurse visit on 7/6 at 2pm.  Ordered injection in Dimension 21. thanks

## 2016-02-01 ENCOUNTER — Ambulatory Visit (INDEPENDENT_AMBULATORY_CARE_PROVIDER_SITE_OTHER): Payer: BLUE CROSS/BLUE SHIELD

## 2016-02-01 DIAGNOSIS — M810 Age-related osteoporosis without current pathological fracture: Secondary | ICD-10-CM | POA: Diagnosis not present

## 2016-02-01 MED ORDER — DENOSUMAB 60 MG/ML ~~LOC~~ SOLN
60.0000 mg | Freq: Once | SUBCUTANEOUS | Status: AC
  Administered 2016-02-01: 60 mg via SUBCUTANEOUS

## 2016-02-01 NOTE — Telephone Encounter (Signed)
Patient received injection today, 02/01/2016

## 2016-02-01 NOTE — Progress Notes (Signed)
Patient came in Prolia injection.  Received in Left arm subcutaneously.  Patient tolerated well.

## 2016-03-05 ENCOUNTER — Encounter: Payer: Self-pay | Admitting: Internal Medicine

## 2016-03-05 DIAGNOSIS — Z1239 Encounter for other screening for malignant neoplasm of breast: Secondary | ICD-10-CM

## 2016-03-07 DIAGNOSIS — G518 Other disorders of facial nerve: Secondary | ICD-10-CM | POA: Diagnosis not present

## 2016-03-07 DIAGNOSIS — M791 Myalgia: Secondary | ICD-10-CM | POA: Diagnosis not present

## 2016-03-07 DIAGNOSIS — G43719 Chronic migraine without aura, intractable, without status migrainosus: Secondary | ICD-10-CM | POA: Diagnosis not present

## 2016-03-07 DIAGNOSIS — M542 Cervicalgia: Secondary | ICD-10-CM | POA: Diagnosis not present

## 2016-03-12 ENCOUNTER — Ambulatory Visit (INDEPENDENT_AMBULATORY_CARE_PROVIDER_SITE_OTHER): Payer: BLUE CROSS/BLUE SHIELD

## 2016-03-12 ENCOUNTER — Ambulatory Visit (INDEPENDENT_AMBULATORY_CARE_PROVIDER_SITE_OTHER): Payer: BLUE CROSS/BLUE SHIELD | Admitting: Internal Medicine

## 2016-03-12 VITALS — BP 122/74 | HR 92 | Temp 98.2°F | Resp 14 | Wt 123.0 lb

## 2016-03-12 DIAGNOSIS — M5010 Cervical disc disorder with radiculopathy, unspecified cervical region: Secondary | ICD-10-CM

## 2016-03-12 DIAGNOSIS — M542 Cervicalgia: Secondary | ICD-10-CM | POA: Diagnosis not present

## 2016-03-12 MED ORDER — GABAPENTIN 100 MG PO CAPS: 100.0000 mg | ORAL_CAPSULE | Freq: Three times a day (TID) | ORAL | 3 refills | Status: DC

## 2016-03-12 MED ORDER — PREDNISONE 10 MG PO TABS: ORAL_TABLET | ORAL | 0 refills | Status: DC

## 2016-03-12 NOTE — Patient Instructions (Signed)
Your pain an numbness may be coming from a pinched nerve in your cervical spine  Plain films today to evaluate your alignment and rule out a cervical vertebra fracture  Prednisone taper x 6 days as your anti inflammatory (suspend aleve for 6 days)  Ok to use tylenol up to 4000 mg daily if needed for one week, (the reduce daily dose to 2000 mg )  Gabapentin 100 mg up to 3 times daily , (or all 3 in the evening) for neuropathy   Avoid carrying anything on your shoulder for the next 4 weeks over 5 lbs   If the pain and numbness persist,  Will  Consider MRI cervical spine

## 2016-03-12 NOTE — Progress Notes (Signed)
Pre visit review using our clinic review tool, if applicable. No additional management support is needed unless otherwise documented below in the visit note. 

## 2016-03-12 NOTE — Progress Notes (Signed)
Subjective:  Patient ID: Lauren Tanner, female    DOB: 11-09-1955  Age: 60 y.o. MRN: [id number]  CC: The encounter diagnosis was Cervical disc disorder with radiculopathy of cervical region.  HPI Lauren PotashMona Canal presents for evaluation of back and shoulder pain.   Started 2 weeks ago after a very strenuous pulling activity.  Pain  Started in shoulder blade on the left and gradually localized to her left arm and left trapezius muscle.  Tingling in the fingers of the left hand noted as well.  No change with aleve.   History of osteoporosis by 214 DEXA managed with Prolia     Outpatient Medications Prior to Visit  Medication Sig Dispense Refill  . buPROPion (WELLBUTRIN XL) 300 MG 24 hr tablet Take 300 mg by mouth daily.    Marland Kitchen. denosumab (PROLIA) 60 MG/ML SOLN injection Inject 60 mg into the skin once. Administer in upper arm, thigh, or abdomen 1 Syringe 1  . diazepam (VALIUM) 5 MG tablet Take 1 tablet (5 mg total) by mouth every 12 (twelve) hours as needed for anxiety. 10 tablet 1  . diltiazem (CARDIZEM) 60 MG tablet Take 1 tablet (60 mg total) by mouth 4 (four) times daily. 30 tablet 0  . famotidine (PEPCID) 40 MG tablet Take 1 tablet (40 mg total) by mouth daily. 90 tablet 1  . imipramine (TOFRANIL) 50 MG tablet Take 50 mg by mouth 2 (two) times daily.    Marland Kitchen. lamoTRIgine (LAMICTAL) 100 MG tablet Take 100 mg by mouth 3 (three) times daily.    Marland Kitchen. estradiol (VAGIFEM) 25 MCG vaginal tablet Place 1 tablet (25 mcg total) vaginally daily. For two weeks,  Then twice weekly thereafter (Patient not taking: Reported on 03/12/2016) 8 tablet 12   No facility-administered medications prior to visit.     Review of Systems;  Patient denies headache, fevers, malaise, unintentional weight loss, skin rash, eye pain, sinus congestion and sinus pain, sore throat, dysphagia,  hemoptysis , cough, dyspnea, wheezing, chest pain, palpitations, orthopnea, edema, abdominal pain, nausea, melena, diarrhea, constipation, flank  pain, dysuria, hematuria, urinary  Frequency, nocturia, numbness, tingling, seizures,  Focal weakness, Loss of consciousness,  Tremor, insomnia, depression, anxiety, and suicidal ideation.      Objective:  BP 122/74   Pulse 92   Temp 98.2 F (36.8 C)   Resp 14   Wt 123 lb (55.8 kg)   BMI 19.26 kg/m   BP Readings from Last 3 Encounters:  03/12/16 122/74  10/02/15 128/82  05/19/15 (!) 124/91    Wt Readings from Last 3 Encounters:  03/12/16 123 lb (55.8 kg)  10/02/15 127 lb (57.6 kg)  05/19/15 120 lb (54.4 kg)    General appearance: alert, cooperative and appears stated age Neck: no adenopathy, no carotid bruit, supple, symmetrical, trachea midline and thyroid not enlarged, symmetric, tenderness over left trapezius muscle,  No spinal tendernes Back: symmetric, no curvature. ROM normal. No CVA tenderness. Lungs: clear to auscultation bilaterally Heart: regular rate and rhythm, S1, S2 normal, no murmur, click, rub or gallop Abdomen: soft, non-tender; bowel sounds normal; no masses,  no organomegaly Pulses: 2+ and symmetric MSK:  Intact sensation left hand,  No thenar muscle atrophy  Skin: Skin color, texture, turgor normal. No rashes or lesions Lymph nodes: Cervical, supraclavicular, and axillary nodes normal.  No results found for: HGBA1C  Lab Results  Component Value Date   CREATININE 0.97 02/01/2015   CREATININE 0.9 01/20/2014   CREATININE 0.9 11/17/2012    Lab Results  Component Value Date   WBC 5.3 02/01/2015   HGB 12.1 02/01/2015   HCT 35.7 (L) 02/01/2015   PLT 270.0 02/01/2015   GLUCOSE 54 (L) 02/01/2015   CHOL 232 (H) 02/01/2015   TRIG 71.0 02/01/2015   HDL 61.40 02/01/2015   LDLDIRECT 135.9 11/17/2012   LDLCALC 156 (H) 02/01/2015   ALT 45 (H) 02/01/2015   AST 35 02/01/2015   NA 139 02/01/2015   K 4.6 02/01/2015   CL 103 02/01/2015   CREATININE 0.97 02/01/2015   BUN 21 02/01/2015   CO2 29 02/01/2015   TSH 1.13 02/01/2015    No results  found.  Assessment & Plan:   Problem List Items Addressed This Visit    Cervical disc disorder with radiculopathy of cervical region - Primary    Suggested by exam and by palin films showing significant degenerative changes at C5-6 level with probable foraminal stenosis. Prednisone in a tapering dose, gabapentin, tylenol.  MRI advised.       Relevant Orders   DG Cervical Spine Complete (Completed)    Other Visit Diagnoses   None.     I am having Ms. Cannan start on predniSONE and gabapentin. I am also having her maintain her lamoTRIgine, buPROPion, imipramine, estradiol, denosumab, diltiazem, diazepam, and famotidine.  Meds ordered this encounter  Medications  . predniSONE (DELTASONE) 10 MG tablet    Sig: 6 tablets on Day 1 , then reduce by 1 tablet daily until gone    Dispense:  21 tablet    Refill:  0  . gabapentin (NEURONTIN) 100 MG capsule    Sig: Take 1 capsule (100 mg total) by mouth 3 (three) times daily.    Dispense:  90 capsule    Refill:  3    There are no discontinued medications.  Follow-up: No Follow-up on file.   Sherlene ShamsULLO, Starlin Steib L, MD

## 2016-03-14 ENCOUNTER — Encounter: Payer: Self-pay | Admitting: Internal Medicine

## 2016-03-14 DIAGNOSIS — M5010 Cervical disc disorder with radiculopathy, unspecified cervical region: Secondary | ICD-10-CM | POA: Insufficient documentation

## 2016-03-14 NOTE — Assessment & Plan Note (Addendum)
Suggested by exam and by palin films showing significant degenerative changes at C5-6 level with probable foraminal stenosis. Prednisone in a tapering dose, gabapentin, tylenol.  MRI advised.

## 2016-03-15 DIAGNOSIS — G43719 Chronic migraine without aura, intractable, without status migrainosus: Secondary | ICD-10-CM | POA: Diagnosis not present

## 2016-03-21 ENCOUNTER — Encounter: Payer: Self-pay | Admitting: Internal Medicine

## 2016-03-21 DIAGNOSIS — Z1231 Encounter for screening mammogram for malignant neoplasm of breast: Secondary | ICD-10-CM | POA: Diagnosis not present

## 2016-03-21 DIAGNOSIS — R928 Other abnormal and inconclusive findings on diagnostic imaging of breast: Secondary | ICD-10-CM | POA: Diagnosis not present

## 2016-03-27 ENCOUNTER — Telehealth: Payer: Self-pay | Admitting: Internal Medicine

## 2016-03-27 DIAGNOSIS — R51 Headache: Secondary | ICD-10-CM | POA: Diagnosis not present

## 2016-03-27 DIAGNOSIS — Z79899 Other long term (current) drug therapy: Secondary | ICD-10-CM | POA: Diagnosis not present

## 2016-03-27 DIAGNOSIS — G43719 Chronic migraine without aura, intractable, without status migrainosus: Secondary | ICD-10-CM | POA: Diagnosis not present

## 2016-03-27 NOTE — Telephone Encounter (Signed)
[facility] has reported that Her mammogram was abnormal on the left .   She will be contacted to get additional films and an ultrasound the facility. If she has not heard from them yet, let us know  

## 2016-03-28 NOTE — Telephone Encounter (Signed)
Left message to call office

## 2016-03-28 NOTE — Telephone Encounter (Signed)
Mailed unread message to patient. thanks 

## 2016-04-03 ENCOUNTER — Encounter: Payer: Self-pay | Admitting: Internal Medicine

## 2016-04-03 DIAGNOSIS — R928 Other abnormal and inconclusive findings on diagnostic imaging of breast: Secondary | ICD-10-CM

## 2016-04-08 NOTE — Telephone Encounter (Signed)
Left message several times for patient to return call to office.

## 2016-04-09 ENCOUNTER — Other Ambulatory Visit: Payer: Self-pay | Admitting: Internal Medicine

## 2016-04-09 NOTE — Telephone Encounter (Signed)
Rx sent to pharmacy   

## 2016-04-12 ENCOUNTER — Inpatient Hospital Stay
Admission: RE | Admit: 2016-04-12 | Discharge: 2016-04-12 | Disposition: A | Discharge status: Home / Self Care | Payer: Self-pay | Source: Ambulatory Visit | Attending: *Deleted | Admitting: *Deleted

## 2016-04-12 ENCOUNTER — Other Ambulatory Visit: Payer: Self-pay | Admitting: *Deleted

## 2016-04-12 DIAGNOSIS — Z9289 Personal history of other medical treatment: Secondary | ICD-10-CM

## 2016-04-16 ENCOUNTER — Telehealth: Payer: Self-pay | Admitting: *Deleted

## 2016-04-16 DIAGNOSIS — R928 Other abnormal and inconclusive findings on diagnostic imaging of breast: Secondary | ICD-10-CM

## 2016-04-16 NOTE — Telephone Encounter (Signed)
-----   Message from Mordecai MaesMelissa B Tuck sent at 04/16/2016  9:42 AM EDT ----- Regarding: orders Lucita FerraraHey Kathy, Can you enter a order for her to have bilateral breast ultrasounds please. She is ordered to have bilateral diagnostic mammogram. Thanks! Melissa

## 2016-04-16 NOTE — Telephone Encounter (Signed)
Orders in 

## 2016-04-24 ENCOUNTER — Encounter: Payer: Self-pay | Admitting: Internal Medicine

## 2016-04-24 DIAGNOSIS — R922 Inconclusive mammogram: Secondary | ICD-10-CM | POA: Diagnosis not present

## 2016-04-24 DIAGNOSIS — R928 Other abnormal and inconclusive findings on diagnostic imaging of breast: Secondary | ICD-10-CM | POA: Diagnosis not present

## 2016-04-24 LAB — HM MAMMOGRAPHY

## 2016-05-08 ENCOUNTER — Other Ambulatory Visit: Payer: Self-pay | Admitting: Internal Medicine

## 2016-05-08 DIAGNOSIS — M542 Cervicalgia: Secondary | ICD-10-CM | POA: Diagnosis not present

## 2016-05-08 DIAGNOSIS — G518 Other disorders of facial nerve: Secondary | ICD-10-CM | POA: Diagnosis not present

## 2016-05-08 DIAGNOSIS — G43719 Chronic migraine without aura, intractable, without status migrainosus: Secondary | ICD-10-CM | POA: Diagnosis not present

## 2016-05-08 DIAGNOSIS — M791 Myalgia: Secondary | ICD-10-CM | POA: Diagnosis not present

## 2016-05-08 DIAGNOSIS — R928 Other abnormal and inconclusive findings on diagnostic imaging of breast: Secondary | ICD-10-CM

## 2016-05-08 NOTE — Progress Notes (Unsigned)
Mammogram ordered

## 2016-05-27 ENCOUNTER — Other Ambulatory Visit: Payer: BLUE CROSS/BLUE SHIELD

## 2016-05-27 ENCOUNTER — Ambulatory Visit: Payer: BLUE CROSS/BLUE SHIELD

## 2016-06-10 DIAGNOSIS — G43719 Chronic migraine without aura, intractable, without status migrainosus: Secondary | ICD-10-CM | POA: Diagnosis not present

## 2016-06-26 DIAGNOSIS — G43719 Chronic migraine without aura, intractable, without status migrainosus: Secondary | ICD-10-CM | POA: Diagnosis not present

## 2016-08-01 ENCOUNTER — Encounter: Payer: Self-pay | Admitting: Internal Medicine

## 2016-08-06 ENCOUNTER — Telehealth: Payer: Self-pay | Admitting: Internal Medicine

## 2016-08-06 NOTE — Telephone Encounter (Signed)
We will need to start charging $29 for prior authorizatiions that require a chart review, as in the case of the Prolia for Ms Enneking

## 2016-08-07 DIAGNOSIS — M791 Myalgia: Secondary | ICD-10-CM | POA: Diagnosis not present

## 2016-08-07 DIAGNOSIS — G518 Other disorders of facial nerve: Secondary | ICD-10-CM | POA: Diagnosis not present

## 2016-08-07 DIAGNOSIS — G43719 Chronic migraine without aura, intractable, without status migrainosus: Secondary | ICD-10-CM | POA: Diagnosis not present

## 2016-08-07 DIAGNOSIS — M542 Cervicalgia: Secondary | ICD-10-CM | POA: Diagnosis not present

## 2016-08-07 NOTE — Telephone Encounter (Signed)
Waiting for pA approval.

## 2016-08-12 NOTE — Telephone Encounter (Signed)
Patient scheduled for Prolia. 

## 2016-08-14 ENCOUNTER — Ambulatory Visit: Payer: BLUE CROSS/BLUE SHIELD

## 2016-09-10 ENCOUNTER — Ambulatory Visit: Payer: BLUE CROSS/BLUE SHIELD

## 2016-09-11 ENCOUNTER — Ambulatory Visit (INDEPENDENT_AMBULATORY_CARE_PROVIDER_SITE_OTHER): Payer: BLUE CROSS/BLUE SHIELD

## 2016-09-11 DIAGNOSIS — M810 Age-related osteoporosis without current pathological fracture: Secondary | ICD-10-CM | POA: Diagnosis not present

## 2016-09-11 MED ORDER — DENOSUMAB 60 MG/ML ~~LOC~~ SOLN
60.0000 mg | Freq: Once | SUBCUTANEOUS | Status: AC
  Administered 2016-09-11: 60 mg via SUBCUTANEOUS

## 2016-09-11 NOTE — Progress Notes (Signed)
Patient comes in for Prolia injection.  Received in right arm subcutaneously.  Patient tolerated well.

## 2016-09-12 NOTE — Progress Notes (Signed)
  I have reviewed the above information and agree with above.   Jonnathan Birman, MD 

## 2016-09-16 DIAGNOSIS — G43719 Chronic migraine without aura, intractable, without status migrainosus: Secondary | ICD-10-CM | POA: Diagnosis not present

## 2016-09-25 DIAGNOSIS — G43719 Chronic migraine without aura, intractable, without status migrainosus: Secondary | ICD-10-CM | POA: Diagnosis not present

## 2016-10-12 ENCOUNTER — Other Ambulatory Visit: Payer: Self-pay | Admitting: Internal Medicine

## 2016-11-04 ENCOUNTER — Ambulatory Visit: Payer: Self-pay | Admitting: Medical

## 2016-11-04 VITALS — BP 130/80 | HR 96 | Temp 98.9°F | Resp 16 | Ht 67.0 in | Wt 127.0 lb

## 2016-11-04 DIAGNOSIS — L237 Allergic contact dermatitis due to plants, except food: Secondary | ICD-10-CM

## 2016-11-04 MED ORDER — PREDNISONE 10 MG (21) PO TBPK: ORAL_TABLET | ORAL | 0 refills | Status: DC

## 2016-11-04 NOTE — Progress Notes (Signed)
   Subjective:    Patient ID: Lauren Tanner, female    DOB: Feb 23, 1956, 61 y.o.   MRN: [id number]  HPI 61 yo female  with rash since Tuesday, Thursday worse, itchty, not painful.  Out with a class at a pond, put jars in the shade and must of touched something outside.   Review of Systems  Constitutional: Positive for chills. Negative for fever.  Skin: Positive for rash.  itchy not painful.     Objective:   Physical Exam  Right anterior wrist , patient 3x4 cm area with blistering and scabbing. No sign of infection at this time. FROM.  2+ radial pulse.       Assessment & Plan:  Poison ivy dermatitis prescribed Prednisone taper 10 mg  pak  6 tablets by mouth today, 5 tablets tomorrow then one less every day there after till finished. #21 no refill, take with food. Reviewed with patient rash care, keep covered , reviewed sign and symptoms of infection with the patiient , return to clinic or call for antibiotics if hese symptoms  occurs. May take otc zyrtec or claritin for itching , take as directed.  Return to the clinic as needed.

## 2016-11-06 DIAGNOSIS — M791 Myalgia: Secondary | ICD-10-CM | POA: Diagnosis not present

## 2016-11-06 DIAGNOSIS — M542 Cervicalgia: Secondary | ICD-10-CM | POA: Diagnosis not present

## 2016-11-06 DIAGNOSIS — G43719 Chronic migraine without aura, intractable, without status migrainosus: Secondary | ICD-10-CM | POA: Diagnosis not present

## 2016-11-06 DIAGNOSIS — G518 Other disorders of facial nerve: Secondary | ICD-10-CM | POA: Diagnosis not present

## 2016-11-14 ENCOUNTER — Encounter: Payer: Self-pay | Admitting: Internal Medicine

## 2016-11-16 ENCOUNTER — Telehealth: Payer: Self-pay | Admitting: Internal Medicine

## 2016-11-16 ENCOUNTER — Other Ambulatory Visit: Payer: Self-pay | Admitting: Internal Medicine

## 2016-11-16 MED ORDER — DIAZEPAM 5 MG PO TABS: 5.0000 mg | ORAL_TABLET | Freq: Two times a day (BID) | ORAL | 1 refills | Status: DC | PRN

## 2016-11-16 NOTE — Telephone Encounter (Signed)
Please print valium rx so we can signs and fax it to pharmacy

## 2016-11-18 MED ORDER — DIAZEPAM 5 MG PO TABS: 5.0000 mg | ORAL_TABLET | Freq: Two times a day (BID) | ORAL | 1 refills | Status: DC | PRN

## 2016-11-18 NOTE — Telephone Encounter (Signed)
Placed in quick sign folder.  

## 2016-11-18 NOTE — Telephone Encounter (Signed)
Script faxed to [facility].

## 2016-12-17 DIAGNOSIS — G43719 Chronic migraine without aura, intractable, without status migrainosus: Secondary | ICD-10-CM | POA: Diagnosis not present

## 2016-12-25 DIAGNOSIS — G518 Other disorders of facial nerve: Secondary | ICD-10-CM | POA: Diagnosis not present

## 2016-12-25 DIAGNOSIS — M542 Cervicalgia: Secondary | ICD-10-CM | POA: Diagnosis not present

## 2016-12-25 DIAGNOSIS — G43719 Chronic migraine without aura, intractable, without status migrainosus: Secondary | ICD-10-CM | POA: Diagnosis not present

## 2016-12-25 DIAGNOSIS — M791 Myalgia: Secondary | ICD-10-CM | POA: Diagnosis not present

## 2017-02-18 DIAGNOSIS — M542 Cervicalgia: Secondary | ICD-10-CM | POA: Diagnosis not present

## 2017-02-18 DIAGNOSIS — G43719 Chronic migraine without aura, intractable, without status migrainosus: Secondary | ICD-10-CM | POA: Diagnosis not present

## 2017-02-18 DIAGNOSIS — M791 Myalgia: Secondary | ICD-10-CM | POA: Diagnosis not present

## 2017-02-18 DIAGNOSIS — G518 Other disorders of facial nerve: Secondary | ICD-10-CM | POA: Diagnosis not present

## 2017-03-05 ENCOUNTER — Encounter: Payer: BLUE CROSS/BLUE SHIELD | Admitting: Internal Medicine

## 2017-03-15 DIAGNOSIS — G43719 Chronic migraine without aura, intractable, without status migrainosus: Secondary | ICD-10-CM | POA: Diagnosis not present

## 2017-03-21 ENCOUNTER — Ambulatory Visit (INDEPENDENT_AMBULATORY_CARE_PROVIDER_SITE_OTHER): Payer: BLUE CROSS/BLUE SHIELD | Admitting: Internal Medicine

## 2017-03-21 ENCOUNTER — Encounter: Payer: Self-pay | Admitting: Internal Medicine

## 2017-03-21 DIAGNOSIS — N952 Postmenopausal atrophic vaginitis: Secondary | ICD-10-CM | POA: Diagnosis not present

## 2017-03-21 DIAGNOSIS — F3341 Major depressive disorder, recurrent, in partial remission: Secondary | ICD-10-CM | POA: Diagnosis not present

## 2017-03-21 DIAGNOSIS — L603 Nail dystrophy: Secondary | ICD-10-CM | POA: Diagnosis not present

## 2017-03-21 DIAGNOSIS — M810 Age-related osteoporosis without current pathological fracture: Secondary | ICD-10-CM | POA: Diagnosis not present

## 2017-03-21 DIAGNOSIS — R5383 Other fatigue: Secondary | ICD-10-CM

## 2017-03-21 DIAGNOSIS — E559 Vitamin D deficiency, unspecified: Secondary | ICD-10-CM

## 2017-03-21 DIAGNOSIS — M5010 Cervical disc disorder with radiculopathy, unspecified cervical region: Secondary | ICD-10-CM | POA: Diagnosis not present

## 2017-03-21 MED ORDER — FAMOTIDINE 40 MG PO TABS: 40.0000 mg | ORAL_TABLET | Freq: Every day | ORAL | 3 refills | Status: DC

## 2017-03-21 MED ORDER — DENOSUMAB 60 MG/ML ~~LOC~~ SOLN
60.0000 mg | Freq: Once | SUBCUTANEOUS | Status: AC
  Administered 2017-03-21: 60 mg via SUBCUTANEOUS

## 2017-03-21 NOTE — Progress Notes (Signed)
Patient ID: Lauren Tanner, female    DOB: Sep 13, 1955  Age: 61 y.o. MRN: [id number]  The patient is here for annual preventive examination and management of other chronic and acute problems.    Seen one year ago  With neck pain and radiculopathy due to cervcal spine issues.  Currently denies neck pain   Abnormal mammogram left side in September, repeat normal  Fannie Knee fater sept 27th  uNCX imagfing   went to [place] in April with daughter   Not using vaginal estrogen.  Not sexually active.   Colonoscopy  2016 5 yr follow up  Osteoporosis: due for  Prolia injection today  Taking calcium and Vit D  No constant neck pain    The risk factors are reflected in the social history.  The roster of all physicians providing medical care to patient - is listed in the Snapshot section of the chart.  Activities of daily living:  The patient is 100% independent in all ADLs: dressing, toileting, feeding as well as independent mobility  Home safety : The patient has smoke detectors in the home. They wear seatbelts.  There are no firearms at home. There is no violence in the home.   There is no risks for hepatitis, STDs or HIV. There is no   history of blood transfusion. They have no travel history to infectious disease endemic areas of the world.  The patient has seen their dentist in the last six month. They have seen their eye doctor in the last year. They admit to slight hearing difficulty with regard to whispered voices and some television programs.  They have deferred audiologic testing in the last year.  They do not  have excessive sun exposure. Discussed the need for sun protection: hats, long sleeves and use of sunscreen if there is significant sun exposure.   Diet: the importance of a healthy diet is discussed. They do have a healthy diet.  The benefits of regular aerobic exercise were discussed. She walks 4 times per week ,  20 minutes.   Depression screen: there are no signs or vegative symptoms  of depression- irritability, change in appetite, anhedonia, sadness/tearfullness.  The following portions of the patient's history were reviewed and updated as appropriate: allergies, current medications, past family history, past medical history,  past surgical history, past social history  and problem list.  Visual acuity was not assessed per patient preference since she has regular follow up with her ophthalmologist. Hearing and body mass index were assessed and reviewed.   During the course of the visit the patient was educated and counseled about appropriate screening and preventive services including : fall prevention , diabetes screening, nutrition counseling, colorectal cancer screening, and recommended immunizations.    CC: Diagnoses of Osteoporosis, post-menopausal, Vitamin D deficiency, Fatigue, unspecified type, Cervical disc disorder with radiculopathy of cervical region, Recurrent major depressive disorder, in partial remission (HCC), Dystrophic nail, and Postmenopausal atrophic vaginitis were pertinent to this visit.  thickened discolored toenail right great toe .  No ain ,  No history of trauma.   History Eppie has a past medical history of Depression; Diverticulosis; Dysrhythmia; GERD (gastroesophageal reflux disease); Headache disorder; History of colonic polyps (2011); and SVT (supraventricular tachycardia) (HCC).   She has a past surgical history that includes Colonoscopy with propofol (N/A, 05/19/2015).   Her family history includes Cancer (age of onset: 9) in her father; Mental illness in her mother.She reports that she has never smoked. She has never used smokeless tobacco. She reports  that she does not drink alcohol or use drugs.  Outpatient Medications Prior to Visit  Medication Sig Dispense Refill  . buPROPion (WELLBUTRIN XL) 300 MG 24 hr tablet Take 300 mg by mouth daily.    Marland Kitchen denosumab (PROLIA) 60 MG/ML SOLN injection Inject 60 mg into the skin once. Administer in  upper arm, thigh, or abdomen 1 Syringe 1  . diazepam (VALIUM) 5 MG tablet Take 1 tablet (5 mg total) by mouth every 12 (twelve) hours as needed for anxiety. 10 tablet 1  . diltiazem (CARDIZEM) 60 MG tablet Take 1 tablet (60 mg total) by mouth 4 (four) times daily. 30 tablet 0  . imipramine (TOFRANIL) 50 MG tablet Take 50 mg by mouth 2 (two) times daily.    Marland Kitchen lamoTRIgine (LAMICTAL) 100 MG tablet Take 100 mg by mouth 3 (three) times daily.    . famotidine (PEPCID) 40 MG tablet take 1 tablet by mouth once daily 90 tablet 1  . predniSONE (STERAPRED UNI-PAK 21 TAB) 10 MG (21) TBPK tablet Take 6 tablets by mouth day one then 5 tablets tomorrow then one less everyday there after. Take food. (Patient not taking: Reported on 03/21/2017) 21 tablet 0   No facility-administered medications prior to visit.     Review of Systems   Patient denies headache, fevers, malaise, unintentional weight loss, skin rash, eye pain, sinus congestion and sinus pain, sore throat, dysphagia,  hemoptysis , cough, dyspnea, wheezing, chest pain, palpitations, orthopnea, edema, abdominal pain, nausea, melena, diarrhea, constipation, flank pain, dysuria, hematuria, urinary  Frequency, nocturia, numbness, tingling, seizures,  Focal weakness, Loss of consciousness,  Tremor, insomnia, depression, anxiety, and suicidal ideation.      Objective:  BP 108/78 (BP Location: Left Arm, Patient Position: Sitting, Cuff Size: Normal)   Pulse 84   Temp 98 F (36.7 C) (Oral)   Resp 15   Ht 5\' 7"  (1.702 m)   Wt 126 lb 9.6 oz (57.4 kg)   SpO2 96%   BMI 19.83 kg/m   Physical Exam   General appearance: alert, cooperative and appears stated age Head: Normocephalic, without obvious abnormality, atraumatic Eyes: conjunctivae/corneas clear. PERRL, EOM's intact. Fundi benign. Ears: normal TM's and external ear canals both ears Nose: Nares normal. Septum midline. Mucosa normal. No drainage or sinus tenderness. Throat: lips, mucosa, and tongue  normal; teeth and gums normal Neck: no adenopathy, no carotid bruit, no JVD, supple, symmetrical, trachea midline and thyroid not enlarged, symmetric, no tenderness/mass/nodules Lungs: clear to auscultation bilaterally Breasts: normal appearance, no masses or tenderness Heart: regular rate and rhythm, S1, S2 normal, no murmur, click, rub or gallop Abdomen: soft, non-tender; bowel sounds normal; no masses,  no organomegaly Extremities: extremities normal, atraumatic, no cyanosis or edema Pulses: 2+ and symmetric Skin: Skin color, texture, turgor normal. No rashes or lesions Neurologic: Alert and oriented X 3, normal strength and tone. Normal symmetric reflexes. Normal coordination and gait.   Foot: great toenail thickened yellowed distally     Assessment & Plan:   Problem List Items Addressed This Visit    Cervical disc disorder with radiculopathy of cervical region    Neurologic exam is normal today       Depression, major, in partial remission (HCC)    Symptoms  are controlled with wellbutrin ,  Valium and lamictal       Dystrophic nail    Recommend daily application of organic apple cider and emory  board.       Osteoporosis, post-menopausal  Receiving Prolia injections every 6 months,  Due today       Relevant Medications   denosumab (PROLIA) injection 60 mg (Completed)   Postmenopausal atrophic vaginitis    Not using vaginal estrogen due to lack of symptoms .        Other Visit Diagnoses    Vitamin D deficiency       Relevant Orders   VITAMIN D 25 Hydroxy (Vit-D Deficiency, Fractures)   Fatigue, unspecified type       Relevant Orders   CBC with Differential/Platelet   TSH   Comprehensive metabolic panel    A total of 25 minutes of face to face time was spent with patient more than half of which was spent in counselling about the above mentioned conditions  and coordination of care   I have discontinued Ms. Craighead's predniSONE. I have also changed her  famotidine. Additionally, I am having her maintain her lamoTRIgine, buPROPion, imipramine, denosumab, diltiazem, and diazepam. We administered denosumab.  Meds ordered this encounter  Medications  . denosumab (PROLIA) injection 60 mg  . famotidine (PEPCID) 40 MG tablet    Sig: Take 1 tablet (40 mg total) by mouth daily.    Dispense:  90 tablet    Refill:  3    Medications Discontinued During This Encounter  Medication Reason  . predniSONE (STERAPRED UNI-PAK 21 TAB) 10 MG (21) TBPK tablet Patient has not taken in last 30 days  . famotidine (PEPCID) 40 MG tablet Reorder    Follow-up: Return in about 3 months (around 06/21/2017), or CPE, fasting labs .   Sherlene Shams, MD

## 2017-03-21 NOTE — Patient Instructions (Signed)
Your toenail may be growing some fungus (which thickens it)  Try a daily application of organic apple cider vinegar after roughing up the surface of the nail with a disposable emory board   Return for your annual cpe with PAP smear in 3 months.  Fasting lipids should also be done that day so plan an early appt.

## 2017-03-22 DIAGNOSIS — L603 Nail dystrophy: Secondary | ICD-10-CM | POA: Insufficient documentation

## 2017-03-22 NOTE — Assessment & Plan Note (Signed)
Receiving Prolia injections every 6 months,  Due today

## 2017-03-22 NOTE — Assessment & Plan Note (Signed)
Symptoms  are controlled with wellbutrin ,  Valium and lamictal

## 2017-03-22 NOTE — Assessment & Plan Note (Signed)
Neurologic exam is normal today

## 2017-03-22 NOTE — Assessment & Plan Note (Signed)
Not using vaginal estrogen due to lack of symptoms .

## 2017-03-22 NOTE — Assessment & Plan Note (Signed)
Recommend daily application of organic apple cider and emory  board.

## 2017-03-26 DIAGNOSIS — M791 Myalgia: Secondary | ICD-10-CM | POA: Diagnosis not present

## 2017-03-26 DIAGNOSIS — M542 Cervicalgia: Secondary | ICD-10-CM | POA: Diagnosis not present

## 2017-03-26 DIAGNOSIS — G43719 Chronic migraine without aura, intractable, without status migrainosus: Secondary | ICD-10-CM | POA: Diagnosis not present

## 2017-03-26 DIAGNOSIS — G518 Other disorders of facial nerve: Secondary | ICD-10-CM | POA: Diagnosis not present

## 2017-04-01 ENCOUNTER — Other Ambulatory Visit: Payer: Self-pay

## 2017-05-07 DIAGNOSIS — M791 Myalgia, unspecified site: Secondary | ICD-10-CM | POA: Diagnosis not present

## 2017-05-07 DIAGNOSIS — G43719 Chronic migraine without aura, intractable, without status migrainosus: Secondary | ICD-10-CM | POA: Diagnosis not present

## 2017-05-07 DIAGNOSIS — M542 Cervicalgia: Secondary | ICD-10-CM | POA: Diagnosis not present

## 2017-05-07 DIAGNOSIS — G518 Other disorders of facial nerve: Secondary | ICD-10-CM | POA: Diagnosis not present

## 2017-06-17 ENCOUNTER — Other Ambulatory Visit (INDEPENDENT_AMBULATORY_CARE_PROVIDER_SITE_OTHER): Payer: BLUE CROSS/BLUE SHIELD

## 2017-06-17 DIAGNOSIS — E559 Vitamin D deficiency, unspecified: Secondary | ICD-10-CM

## 2017-06-17 DIAGNOSIS — R5383 Other fatigue: Secondary | ICD-10-CM

## 2017-06-17 DIAGNOSIS — G43719 Chronic migraine without aura, intractable, without status migrainosus: Secondary | ICD-10-CM | POA: Diagnosis not present

## 2017-06-17 LAB — COMPREHENSIVE METABOLIC PANEL
ALK PHOS: 49 U/L (ref 39–117)
ALT: 25 U/L (ref 0–35)
AST: 23 U/L (ref 0–37)
Albumin: 4.3 g/dL (ref 3.5–5.2)
BUN: 16 mg/dL (ref 6–23)
CO2: 31 meq/L (ref 19–32)
Calcium: 9.2 mg/dL (ref 8.4–10.5)
Chloride: 104 mEq/L (ref 96–112)
Creatinine, Ser: 0.97 mg/dL (ref 0.40–1.20)
GFR: 62.15 mL/min (ref >60.00)
GLUCOSE: 90 mg/dL (ref 70–99)
POTASSIUM: 4.2 meq/L (ref 3.5–5.1)
Sodium: 139 mEq/L (ref 135–145)
Total Bilirubin: 0.5 mg/dL (ref 0.2–1.2)
Total Protein: 6.9 g/dL (ref 6.0–8.3)

## 2017-06-17 LAB — CBC WITH DIFFERENTIAL/PLATELET
BASOS ABS: 0 10*3/uL (ref 0.0–0.1)
BASOS PCT: 0.6 % (ref 0.0–3.0)
EOS ABS: 0 10*3/uL (ref 0.0–0.7)
Eosinophils Relative: 0 % (ref 0.0–5.0)
HEMATOCRIT: 35.5 % — AB (ref 36.0–46.0)
Hemoglobin: 12 g/dL (ref 12.0–15.0)
Lymphocytes Relative: 39.1 % (ref 12.0–46.0)
Lymphs Abs: 2.1 10*3/uL (ref 0.7–4.0)
MCHC: 33.7 g/dL (ref 30.0–36.0)
MCV: 96.2 fl (ref 78.0–100.0)
MONO ABS: 0.3 10*3/uL (ref 0.1–1.0)
Monocytes Relative: 5.4 % (ref 3.0–12.0)
NEUTROS ABS: 3 10*3/uL (ref 1.4–7.7)
Neutrophils Relative %: 54.9 % (ref 43.0–77.0)
PLATELETS: 280 10*3/uL (ref 150.0–400.0)
RBC: 3.69 Mil/uL — ABNORMAL LOW (ref 3.87–5.11)
RDW: 12.6 % (ref 11.5–15.5)
WBC: 5.5 10*3/uL (ref 4.0–10.5)

## 2017-06-17 LAB — VITAMIN D 25 HYDROXY (VIT D DEFICIENCY, FRACTURES): VITD: 49.47 ng/mL (ref 30.00–100.00)

## 2017-06-17 LAB — TSH: TSH: 2.34 u[IU]/mL (ref 0.35–4.50)

## 2017-06-24 ENCOUNTER — Encounter: Payer: Self-pay | Admitting: *Deleted

## 2017-06-24 ENCOUNTER — Other Ambulatory Visit (HOSPITAL_COMMUNITY)
Admission: RE | Admit: 2017-06-24 | Discharge: 2017-06-24 | Disposition: A | Discharge status: Home / Self Care | Payer: BLUE CROSS/BLUE SHIELD | Source: Ambulatory Visit | Attending: Internal Medicine | Admitting: Internal Medicine

## 2017-06-24 ENCOUNTER — Ambulatory Visit (INDEPENDENT_AMBULATORY_CARE_PROVIDER_SITE_OTHER): Payer: BLUE CROSS/BLUE SHIELD | Admitting: Internal Medicine

## 2017-06-24 ENCOUNTER — Encounter: Payer: Self-pay | Admitting: Internal Medicine

## 2017-06-24 VITALS — BP 130/82 | HR 88 | Temp 98.0°F | Resp 14 | Ht 65.0 in | Wt 127.0 lb

## 2017-06-24 DIAGNOSIS — Z1231 Encounter for screening mammogram for malignant neoplasm of breast: Secondary | ICD-10-CM | POA: Diagnosis not present

## 2017-06-24 DIAGNOSIS — M810 Age-related osteoporosis without current pathological fracture: Secondary | ICD-10-CM | POA: Diagnosis not present

## 2017-06-24 DIAGNOSIS — Z0000 Encounter for general adult medical examination without abnormal findings: Secondary | ICD-10-CM

## 2017-06-24 DIAGNOSIS — Z23 Encounter for immunization: Secondary | ICD-10-CM | POA: Diagnosis not present

## 2017-06-24 DIAGNOSIS — E7800 Pure hypercholesterolemia, unspecified: Secondary | ICD-10-CM | POA: Insufficient documentation

## 2017-06-24 DIAGNOSIS — Z1239 Encounter for other screening for malignant neoplasm of breast: Secondary | ICD-10-CM

## 2017-06-24 DIAGNOSIS — Z124 Encounter for screening for malignant neoplasm of cervix: Secondary | ICD-10-CM

## 2017-06-24 NOTE — Progress Notes (Signed)
Patient ID: Lauren Tanner, female    DOB: [birth year]  Age: 61 y.o. MRN: [id number]  The patient is here for annual  Preventive examination and management of other chronic and acute problems.   Screening mammogram overdue Sept 0218  Fasting labs done  Nov 20 all normal  [facility]    The risk factors are reflected in the social history.  The roster of all physicians providing medical care to patient - is listed in the Snapshot section of the chart.  Activities of daily living:  The patient is 100% independent in all ADLs: dressing, toileting, feeding as well as independent mobility  Home safety : The patient has smoke detectors in the home. They wear seatbelts.  There are no firearms at home. There is no violence in the home.   There is no risks for hepatitis, STDs or HIV. There is no   history of blood transfusion. They have no travel history to infectious disease endemic areas of the world.  The patient has seen their dentist in the last six month. They have seen their eye doctor in the last year.    They do not  have excessive sun exposure. Discussed the need for sun protection: hats, long sleeves and use of sunscreen if there is significant sun exposure.   Diet: the importance of a healthy diet is discussed. They do have a healthy diet.  The benefits of regular aerobic exercise were discussed. She walks 4 times per week ,  20 minutes.   Depression screen: there are no signs or vegative symptoms of depression- irritability, change in appetite, anhedonia, sadness/tearfullness.  Cognitive assessment: the patient manages all their financial and personal affairs and is actively engaged. They could relate day,date,year and events; recalled 2/3 objects at 3 minutes; performed clock-face test normally.  The following portions of the patient's history were reviewed and updated as appropriate: allergies, current medications, past family history, past medical history,  past surgical  history, past social history  and problem list.  Visual acuity was not assessed per patient preference since she has regular follow up with her ophthalmologist. Hearing and body mass index were assessed and reviewed.   During the course of the visit the patient was educated and counseled about appropriate screening and preventive services including : fall prevention , diabetes screening, nutrition counseling, colorectal cancer screening, and recommended immunizations.    CC: The primary encounter diagnosis was Screening for cervical cancer. Diagnoses of Pure hypercholesterolemia, Need for shingles vaccine, Breast cancer screening, Age-related osteoporosis without current pathological fracture, Encounter for preventive health examination, and Osteoporosis, post-menopausal were also pertinent to this visit.  History Lauren FanningMona has a past medical history of Depression, Diverticulosis, Dysrhythmia, GERD (gastroesophageal reflux disease), Headache disorder, History of colonic polyps (2011), and SVT (supraventricular tachycardia) (HCC).   She has a past surgical history that includes Colonoscopy with propofol (N/A, 05/19/2015).   Her family history includes Cancer (age of onset: 2665) in her father; Mental illness in her mother.She reports that  has never smoked. she has never used smokeless tobacco. She reports that she does not drink alcohol or use drugs.  Outpatient Medications Prior to Visit  Medication Sig Dispense Refill  . buPROPion (WELLBUTRIN XL) 300 MG 24 hr tablet Take 300 mg by mouth daily.    . clonazePAM (KLONOPIN) 0.5 MG tablet   0  . denosumab (PROLIA) 60 MG/ML SOLN injection Inject 60 mg into the skin once. Administer in upper arm, thigh, or abdomen 1 Syringe  1  . diazepam (VALIUM) 5 MG tablet Take 1 tablet (5 mg total) by mouth every 12 (twelve) hours as needed for anxiety. 10 tablet 1  . diltiazem (CARDIZEM) 60 MG tablet Take 1 tablet (60 mg total) by mouth 4 (four) times daily. 30 tablet 0   . famotidine (PEPCID) 40 MG tablet Take 1 tablet (40 mg total) by mouth daily. 90 tablet 3  . imipramine (TOFRANIL) 50 MG tablet Take 50 mg by mouth 2 (two) times daily.    Marland Kitchen lamoTRIgine (LAMICTAL) 100 MG tablet Take 100 mg by mouth 3 (three) times daily.     No facility-administered medications prior to visit.     Review of Systems   Patient denies headache, fevers, malaise, unintentional weight loss, skin rash, eye pain, sinus congestion and sinus pain, sore throat, dysphagia,  hemoptysis , cough, dyspnea, wheezing, chest pain, palpitations, orthopnea, edema, abdominal pain, nausea, melena, diarrhea, constipation, flank pain, dysuria, hematuria, urinary  Frequency, nocturia, numbness, tingling, seizures,  Focal weakness, Loss of consciousness,  Tremor, insomnia, depression, anxiety, and suicidal ideation.      Objective:  BP 130/82 (BP Location: Left Arm, Patient Position: Sitting, Cuff Size: Normal)   Pulse 88   Temp 98 F (36.7 C) (Oral)   Resp 14   Ht 5\' 5"  (1.651 m)   Wt 127 lb (57.6 kg)   SpO2 98%   BMI 21.13 kg/m   Physical Exam   General Appearance:    Alert, cooperative, no distress, appears stated age  Head:    Normocephalic, without obvious abnormality, atraumatic  Eyes:    PERRL, conjunctiva/corneas clear, EOM's intact, fundi    benign, both eyes  Ears:    Normal TM's and external ear canals, both ears  Nose:   Nares normal, septum midline, mucosa normal, no drainage    or sinus tenderness  Throat:   Lips, mucosa, and tongue normal; teeth and gums normal  Neck:   Supple, symmetrical, trachea midline, no adenopathy;    thyroid:  no enlargement/tenderness/nodules; no carotid   bruit or JVD  Back:     Symmetric, no curvature, ROM normal, no CVA tenderness  Lungs:     Clear to auscultation bilaterally, respirations unlabored  Chest Wall:    No tenderness or deformity   Heart:    Regular rate and rhythm, S1 and S2 normal, no murmur, rub   or gallop  Breast Exam:     No tenderness, masses, or nipple abnormality  Abdomen:     Soft, non-tender, bowel sounds active all four quadrants,    no masses, no organomegaly  Genitalia:    Pelvic: cervix normal in appearance, external genitalia normal, no adnexal masses or tenderness, no cervical motion tenderness, rectovaginal septum normal, uterus normal size, shape, and consistency and vagina normal without discharge  Extremities:   Extremities normal, atraumatic, no cyanosis or edema  Pulses:   2+ and symmetric all extremities  Skin:   Skin color, texture, turgor normal, no rashes or lesions  Lymph nodes:   Cervical, supraclavicular, and axillary nodes normal  Neurologic:   CNII-XII intact, normal strength, sensation and reflexes    throughout       Assessment & Plan:   Problem List Items Addressed This Visit    Encounter for preventive health examination    Annual comprehensive preventive exam was done as well as an evaluation and management of chronic conditions .  During the course of the visit the patient was educated and counseled about  appropriate screening and preventive services including :  diabetes screening, lipid analysis with projected  10 year  risk for CAD , nutrition counseling, breast, cervical and colorectal cancer screening, and recommended immunizations.  Printed recommendations for health maintenance screenings was given      Osteoporosis, post-menopausal    Receiving Prolia injections every 6 months,  DEXA due for repeat.        Other Visit Diagnoses    Screening for cervical cancer    -  Primary   Relevant Orders   Cytology - PAP   Pure hypercholesterolemia       Relevant Orders   Lipid panel   Need for shingles vaccine       Relevant Orders   Varicella zoster antibody, IgG   Breast cancer screening       Relevant Orders   MM DIGITAL SCREENING BILATERAL   Age-related osteoporosis without current pathological fracture       Relevant Orders   DG Bone Density      I am  having Lauren Tanner maintain her lamoTRIgine, buPROPion, imipramine, denosumab, diltiazem, diazepam, famotidine, and clonazePAM.  No orders of the defined types were placed in this encounter.   There are no discontinued medications.  Follow-up: No Follow-up on file.   Sherlene Shamseresa L Kaivon Livesey, MD

## 2017-06-24 NOTE — Patient Instructions (Signed)
MAMMOGRAM AND DEXA SCAN HAVE BEEN ORDERED  YOU CAN RETURN FOR FASTING LIPIDS AND CHICKEN POX TITER AT [place] Maintenance for Postmenopausal Women Menopause is a normal process in which your reproductive ability comes to an end. This process happens gradually over a span of months to years, usually between the ages of 68 and 50. Menopause is complete when you have missed 12 consecutive menstrual periods. It is important to talk with your health care provider about some of the most common conditions that affect postmenopausal women, such as heart disease, cancer, and bone loss (osteoporosis). Adopting a healthy lifestyle and getting preventive care can help to promote your health and wellness. Those actions can also lower your chances of developing some of these common conditions. What should I know about menopause? During menopause, you may experience a number of symptoms, such as:  Moderate-to-severe hot flashes.  Night sweats.  Decrease in sex drive.  Mood swings.  Headaches.  Tiredness.  Irritability.  Memory problems.  Insomnia.  Choosing to treat or not to treat menopausal changes is an individual decision that you make with your health care provider. What should I know about hormone replacement therapy and supplements? Hormone therapy products are effective for treating symptoms that are associated with menopause, such as hot flashes and night sweats. Hormone replacement carries certain risks, especially as you become older. If you are thinking about using estrogen or estrogen with progestin treatments, discuss the benefits and risks with your health care provider. What should I know about heart disease and stroke? Heart disease, heart attack, and stroke become more likely as you age. This may be due, in part, to the hormonal changes that your body experiences during menopause. These can affect how your body processes dietary fats, triglycerides, and cholesterol.  Heart attack and stroke are both medical emergencies. There are many things that you can do to help prevent heart disease and stroke:  Have your blood pressure checked at least every 1-2 years. High blood pressure causes heart disease and increases the risk of stroke.  If you are 47-28 years old, ask your health care provider if you should take aspirin to prevent a heart attack or a stroke.  Do not use any tobacco products, including cigarettes, chewing tobacco, or electronic cigarettes. If you need help quitting, ask your health care provider.  It is important to eat a healthy diet and maintain a healthy weight. ? Be sure to include plenty of vegetables, fruits, low-fat dairy products, and lean protein. ? Avoid eating foods that are high in solid fats, added sugars, or salt (sodium).  Get regular exercise. This is one of the most important things that you can do for your health. ? Try to exercise for at least 150 minutes each week. The type of exercise that you do should increase your heart rate and make you sweat. This is known as moderate-intensity exercise. ? Try to do strengthening exercises at least twice each week. Do these in addition to the moderate-intensity exercise.  Know your numbers.Ask your health care provider to check your cholesterol and your blood glucose. Continue to have your blood tested as directed by your health care provider.  What should I know about cancer screening? There are several types of cancer. Take the following steps to reduce your risk and to catch any cancer development as early as possible. Breast Cancer  Practice breast self-awareness. ? This means understanding how your breasts normally appear and feel. ? It  also means doing regular breast self-exams. Let your health care provider know about any changes, no matter how small.  If you are 84 or older, have a clinician do a breast exam (clinical breast exam or CBE) every year. Depending on your age,  family history, and medical history, it may be recommended that you also have a yearly breast X-ray (mammogram).  If you have a family history of breast cancer, talk with your health care provider about genetic screening.  If you are at high risk for breast cancer, talk with your health care provider about having an MRI and a mammogram every year.  Breast cancer (BRCA) gene test is recommended for women who have family members with BRCA-related cancers. Results of the assessment will determine the need for genetic counseling and BRCA1 and for BRCA2 testing. BRCA-related cancers include these types: ? Breast. This occurs in males or females. ? Ovarian. ? Tubal. This may also be called fallopian tube cancer. ? Cancer of the abdominal or pelvic lining (peritoneal cancer). ? Prostate. ? Pancreatic.  Cervical, Uterine, and Ovarian Cancer Your health care provider may recommend that you be screened regularly for cancer of the pelvic organs. These include your ovaries, uterus, and vagina. This screening involves a pelvic exam, which includes checking for microscopic changes to the surface of your cervix (Pap test).  For women ages 21-65, health care providers may recommend a pelvic exam and a Pap test every three years. For women ages 26-65, they may recommend the Pap test and pelvic exam, combined with testing for human papilloma virus (HPV), every five years. Some types of HPV increase your risk of cervical cancer. Testing for HPV may also be done on women of any age who have unclear Pap test results.  Other health care providers may not recommend any screening for nonpregnant women who are considered low risk for pelvic cancer and have no symptoms. Ask your health care provider if a screening pelvic exam is right for you.  If you have had past treatment for cervical cancer or a condition that could lead to cancer, you need Pap tests and screening for cancer for at least 20 years after your  treatment. If Pap tests have been discontinued for you, your risk factors (such as having a new sexual partner) need to be reassessed to determine if you should start having screenings again. Some women have medical problems that increase the chance of getting cervical cancer. In these cases, your health care provider may recommend that you have screening and Pap tests more often.  If you have a family history of uterine cancer or ovarian cancer, talk with your health care provider about genetic screening.  If you have vaginal bleeding after reaching menopause, tell your health care provider.  There are currently no reliable tests available to screen for ovarian cancer.  Lung Cancer Lung cancer screening is recommended for adults 71-69 years old who are at high risk for lung cancer because of a history of smoking. A yearly low-dose CT scan of the lungs is recommended if you:  Currently smoke.  Have a history of at least 30 pack-years of smoking and you currently smoke or have quit within the past 15 years. A pack-year is smoking an average of one pack of cigarettes per day for one year.  Yearly screening should:  Continue until it has been 15 years since you quit.  Stop if you develop a health problem that would prevent you from having lung cancer treatment.  Colorectal Cancer  This type of cancer can be detected and can often be prevented.  Routine colorectal cancer screening usually begins at age 16 and continues through age 36.  If you have risk factors for colon cancer, your health care provider may recommend that you be screened at an earlier age.  If you have a family history of colorectal cancer, talk with your health care provider about genetic screening.  Your health care provider may also recommend using home test kits to check for hidden blood in your stool.  A small camera at the end of a tube can be used to examine your colon directly (sigmoidoscopy or colonoscopy). This  is done to check for the earliest forms of colorectal cancer.  Direct examination of the colon should be repeated every 5-10 years until age 54. However, if early forms of precancerous polyps or small growths are found or if you have a family history or genetic risk for colorectal cancer, you may need to be screened more often.  Skin Cancer  Check your skin from head to toe regularly.  Monitor any moles. Be sure to tell your health care provider: ? About any new moles or changes in moles, especially if there is a change in a mole's shape or color. ? If you have a mole that is larger than the size of a pencil eraser.  If any of your family members has a history of skin cancer, especially at a young age, talk with your health care provider about genetic screening.  Always use sunscreen. Apply sunscreen liberally and repeatedly throughout the day.  Whenever you are outside, protect yourself by wearing long sleeves, pants, a wide-brimmed hat, and sunglasses.  What should I know about osteoporosis? Osteoporosis is a condition in which bone destruction happens more quickly than new bone creation. After menopause, you may be at an increased risk for osteoporosis. To help prevent osteoporosis or the bone fractures that can happen because of osteoporosis, the following is recommended:  If you are 23-60 years old, get at least 1,000 mg of calcium and at least 600 mg of vitamin D per day.  If you are older than age 13 but younger than age 64, get at least 1,200 mg of calcium and at least 600 mg of vitamin D per day.  If you are older than age 28, get at least 1,200 mg of calcium and at least 800 mg of vitamin D per day.  Smoking and excessive alcohol intake increase the risk of osteoporosis. Eat foods that are rich in calcium and vitamin D, and do weight-bearing exercises several times each week as directed by your health care provider. What should I know about how menopause affects my mental  health? Depression may occur at any age, but it is more common as you become older. Common symptoms of depression include:  Low or sad mood.  Changes in sleep patterns.  Changes in appetite or eating patterns.  Feeling an overall lack of motivation or enjoyment of activities that you previously enjoyed.  Frequent crying spells.  Talk with your health care provider if you think that you are experiencing depression. What should I know about immunizations? It is important that you get and maintain your immunizations. These include:  Tetanus, diphtheria, and pertussis (Tdap) booster vaccine.  Influenza every year before the flu season begins.  Pneumonia vaccine.  Shingles vaccine.  Your health care provider may also recommend other immunizations. This information is not intended to replace advice given  to you by your health care provider. Make sure you discuss any questions you have with your health care provider. Document Released: 09/06/2005 Document Revised: 02/02/2016 Document Reviewed: 04/18/2015 Elsevier Interactive Patient Education  2018 [employer].

## 2017-06-25 NOTE — Assessment & Plan Note (Signed)
Receiving Prolia injections every 6 months,  DEXA due for repeat.

## 2017-06-25 NOTE — Assessment & Plan Note (Signed)
Annual comprehensive preventive exam was done as well as an evaluation and management of chronic conditions .  During the course of the visit the patient was educated and counseled about appropriate screening and preventive services including :  diabetes screening, lipid analysis with projected  10 year  risk for CAD , nutrition counseling, breast, cervical and colorectal cancer screening, and recommended immunizations.  Printed recommendations for health maintenance screenings was given 

## 2017-06-26 DIAGNOSIS — M791 Myalgia, unspecified site: Secondary | ICD-10-CM | POA: Diagnosis not present

## 2017-06-26 DIAGNOSIS — G43719 Chronic migraine without aura, intractable, without status migrainosus: Secondary | ICD-10-CM | POA: Diagnosis not present

## 2017-06-26 DIAGNOSIS — G518 Other disorders of facial nerve: Secondary | ICD-10-CM | POA: Diagnosis not present

## 2017-06-26 DIAGNOSIS — M542 Cervicalgia: Secondary | ICD-10-CM | POA: Diagnosis not present

## 2017-06-26 LAB — CYTOLOGY - PAP
Diagnosis: NEGATIVE
HPV: NOT DETECTED

## 2017-06-27 ENCOUNTER — Telehealth: Payer: Self-pay | Admitting: Internal Medicine

## 2017-06-27 ENCOUNTER — Encounter: Payer: Self-pay | Admitting: Internal Medicine

## 2017-06-27 NOTE — Telephone Encounter (Unsigned)
Copied from CRM [phone]. Topic: Inquiry >> Jun 27, 2017  3:14 PM Raquel SarnaHayes, Teresa G wrote:  [facility] [address] [facility] [address] to make appointments for my mammogram and bone density scans, but they say they do not have an order for these at this time. She is checking the status of the order for the [facility]

## 2017-06-27 NOTE — Telephone Encounter (Signed)
Called patient to let her know that orders were placed on 11/27 but we were printing orders out to fax. Advised patient that they should call her to schedule bit if she has not heard from them by Tuesday of next week she should call them to schedule.

## 2017-06-29 ENCOUNTER — Encounter: Payer: Self-pay | Admitting: Internal Medicine

## 2017-07-04 ENCOUNTER — Encounter: Payer: Self-pay | Admitting: Internal Medicine

## 2017-07-04 DIAGNOSIS — M8588 Other specified disorders of bone density and structure, other site: Secondary | ICD-10-CM | POA: Diagnosis not present

## 2017-07-04 DIAGNOSIS — M810 Age-related osteoporosis without current pathological fracture: Secondary | ICD-10-CM | POA: Diagnosis not present

## 2017-07-04 DIAGNOSIS — R928 Other abnormal and inconclusive findings on diagnostic imaging of breast: Secondary | ICD-10-CM | POA: Diagnosis not present

## 2017-07-04 DIAGNOSIS — Z780 Asymptomatic menopausal state: Secondary | ICD-10-CM | POA: Diagnosis not present

## 2017-07-04 DIAGNOSIS — Z1231 Encounter for screening mammogram for malignant neoplasm of breast: Secondary | ICD-10-CM | POA: Diagnosis not present

## 2017-07-04 LAB — HM DEXA SCAN

## 2017-07-11 ENCOUNTER — Telehealth: Payer: Self-pay | Admitting: Internal Medicine

## 2017-07-11 NOTE — Telephone Encounter (Signed)
[facility]  has reported that your  mammogram was asymmetric  on the left.  They would like to get additional images, including an ultrasound,  And they will be contacting you directly to schedule those.    Bone Density scores received, she has had improvement in her bone density,  Continue prolia injections for management of osteoporosis

## 2017-07-14 NOTE — Telephone Encounter (Signed)
LMTCB

## 2017-07-14 NOTE — Telephone Encounter (Signed)
Copied from CRM [id number]. Topic: General - Other >> Jul 14, 2017  2:13 PM Darletta MollLander, Lumin L wrote: Reason for CRM: Missed cal from Jessica W.

## 2017-07-14 NOTE — Telephone Encounter (Signed)
Spoke with pt and informed her of her test results. Pt stated that she has already been set up for the additional imaging.

## 2017-07-16 DIAGNOSIS — G43719 Chronic migraine without aura, intractable, without status migrainosus: Secondary | ICD-10-CM | POA: Diagnosis not present

## 2017-07-23 DIAGNOSIS — R928 Other abnormal and inconclusive findings on diagnostic imaging of breast: Secondary | ICD-10-CM | POA: Diagnosis not present

## 2017-07-23 DIAGNOSIS — R922 Inconclusive mammogram: Secondary | ICD-10-CM | POA: Diagnosis not present

## 2017-07-24 ENCOUNTER — Telehealth: Payer: Self-pay | Admitting: Internal Medicine

## 2017-07-24 NOTE — Telephone Encounter (Signed)
Since receiving Prolia,  T scores have improved by 10% in the spine. Continue current therapy

## 2017-07-25 NOTE — Telephone Encounter (Signed)
Patient was informed of results.  Patient understood and no questions, comments, or concerns at this time.  

## 2017-08-01 ENCOUNTER — Encounter: Payer: Self-pay | Admitting: Internal Medicine

## 2017-08-01 NOTE — Progress Notes (Signed)
Insurance verification for Prolia filed on [employer] Portal. 

## 2017-08-11 DIAGNOSIS — M791 Myalgia, unspecified site: Secondary | ICD-10-CM | POA: Diagnosis not present

## 2017-08-11 DIAGNOSIS — G43719 Chronic migraine without aura, intractable, without status migrainosus: Secondary | ICD-10-CM | POA: Diagnosis not present

## 2017-08-11 DIAGNOSIS — G518 Other disorders of facial nerve: Secondary | ICD-10-CM | POA: Diagnosis not present

## 2017-08-11 DIAGNOSIS — M542 Cervicalgia: Secondary | ICD-10-CM | POA: Diagnosis not present

## 2017-09-22 DIAGNOSIS — G43719 Chronic migraine without aura, intractable, without status migrainosus: Secondary | ICD-10-CM | POA: Diagnosis not present

## 2017-09-25 DIAGNOSIS — M542 Cervicalgia: Secondary | ICD-10-CM | POA: Diagnosis not present

## 2017-09-25 DIAGNOSIS — G518 Other disorders of facial nerve: Secondary | ICD-10-CM | POA: Diagnosis not present

## 2017-09-25 DIAGNOSIS — G43719 Chronic migraine without aura, intractable, without status migrainosus: Secondary | ICD-10-CM | POA: Diagnosis not present

## 2017-09-25 DIAGNOSIS — M791 Myalgia, unspecified site: Secondary | ICD-10-CM | POA: Diagnosis not present

## 2017-11-04 DIAGNOSIS — G43719 Chronic migraine without aura, intractable, without status migrainosus: Secondary | ICD-10-CM | POA: Diagnosis not present

## 2017-11-04 DIAGNOSIS — M791 Myalgia, unspecified site: Secondary | ICD-10-CM | POA: Diagnosis not present

## 2017-11-04 DIAGNOSIS — G518 Other disorders of facial nerve: Secondary | ICD-10-CM | POA: Diagnosis not present

## 2017-11-04 DIAGNOSIS — M542 Cervicalgia: Secondary | ICD-10-CM | POA: Diagnosis not present

## 2017-12-16 DIAGNOSIS — G43719 Chronic migraine without aura, intractable, without status migrainosus: Secondary | ICD-10-CM | POA: Diagnosis not present

## 2017-12-25 DIAGNOSIS — G43719 Chronic migraine without aura, intractable, without status migrainosus: Secondary | ICD-10-CM | POA: Diagnosis not present

## 2017-12-25 DIAGNOSIS — M542 Cervicalgia: Secondary | ICD-10-CM | POA: Diagnosis not present

## 2017-12-25 DIAGNOSIS — G518 Other disorders of facial nerve: Secondary | ICD-10-CM | POA: Diagnosis not present

## 2017-12-25 DIAGNOSIS — M791 Myalgia, unspecified site: Secondary | ICD-10-CM | POA: Diagnosis not present

## 2017-12-30 ENCOUNTER — Encounter: Payer: Self-pay | Admitting: Internal Medicine

## 2017-12-31 MED ORDER — DIAZEPAM 5 MG PO TABS: 5.0000 mg | ORAL_TABLET | Freq: Two times a day (BID) | ORAL | 1 refills | Status: DC | PRN

## 2018-01-05 DIAGNOSIS — M545 Low back pain: Secondary | ICD-10-CM | POA: Diagnosis not present

## 2018-01-21 ENCOUNTER — Telehealth: Payer: Self-pay | Admitting: Internal Medicine

## 2018-01-21 NOTE — Telephone Encounter (Signed)
Insurance verification for Prolia filed on [employer] Portal. 

## 2018-01-28 ENCOUNTER — Other Ambulatory Visit (INDEPENDENT_AMBULATORY_CARE_PROVIDER_SITE_OTHER): Payer: BLUE CROSS/BLUE SHIELD

## 2018-01-28 DIAGNOSIS — M542 Cervicalgia: Secondary | ICD-10-CM | POA: Diagnosis not present

## 2018-01-28 DIAGNOSIS — G43719 Chronic migraine without aura, intractable, without status migrainosus: Secondary | ICD-10-CM | POA: Diagnosis not present

## 2018-01-28 DIAGNOSIS — M791 Myalgia, unspecified site: Secondary | ICD-10-CM | POA: Diagnosis not present

## 2018-01-28 DIAGNOSIS — Z23 Encounter for immunization: Secondary | ICD-10-CM | POA: Diagnosis not present

## 2018-01-28 DIAGNOSIS — G518 Other disorders of facial nerve: Secondary | ICD-10-CM | POA: Diagnosis not present

## 2018-01-30 LAB — VARICELLA ZOSTER ANTIBODY, IGG: Varicella IgG: 390.8 index

## 2018-03-11 DIAGNOSIS — G43719 Chronic migraine without aura, intractable, without status migrainosus: Secondary | ICD-10-CM | POA: Diagnosis not present

## 2018-03-26 DIAGNOSIS — M542 Cervicalgia: Secondary | ICD-10-CM | POA: Diagnosis not present

## 2018-03-26 DIAGNOSIS — M791 Myalgia, unspecified site: Secondary | ICD-10-CM | POA: Diagnosis not present

## 2018-03-26 DIAGNOSIS — G43719 Chronic migraine without aura, intractable, without status migrainosus: Secondary | ICD-10-CM | POA: Diagnosis not present

## 2018-03-26 DIAGNOSIS — G518 Other disorders of facial nerve: Secondary | ICD-10-CM | POA: Diagnosis not present

## 2018-04-10 ENCOUNTER — Other Ambulatory Visit: Payer: Self-pay

## 2018-04-10 MED ORDER — FAMOTIDINE 40 MG PO TABS: 40.0000 mg | ORAL_TABLET | Freq: Every day | ORAL | 1 refills | Status: DC

## 2018-05-06 ENCOUNTER — Telehealth: Payer: Self-pay | Admitting: Internal Medicine

## 2018-05-06 ENCOUNTER — Encounter: Payer: Self-pay | Admitting: *Deleted

## 2018-05-06 DIAGNOSIS — M542 Cervicalgia: Secondary | ICD-10-CM | POA: Diagnosis not present

## 2018-05-06 DIAGNOSIS — M791 Myalgia, unspecified site: Secondary | ICD-10-CM | POA: Diagnosis not present

## 2018-05-06 DIAGNOSIS — G518 Other disorders of facial nerve: Secondary | ICD-10-CM | POA: Diagnosis not present

## 2018-05-06 DIAGNOSIS — G43719 Chronic migraine without aura, intractable, without status migrainosus: Secondary | ICD-10-CM | POA: Diagnosis not present

## 2018-05-06 NOTE — Telephone Encounter (Signed)
Copied from CRM [id number]. Topic: General - Other >> May 06, 2018 11:01 AM Tamela Oddi wrote: Reason for CRM: Kendal Hymen from [facility] called to inform office that patient does have a PA for Prolia in place which started on 08/2017 and is good until 09/16/18 and is current.  The Ref. # is [id number].  Any questions, please call Kendal Hymen at [phone].

## 2018-05-06 NOTE — Telephone Encounter (Signed)
Notified patient ready to schedule.

## 2018-05-14 ENCOUNTER — Ambulatory Visit (INDEPENDENT_AMBULATORY_CARE_PROVIDER_SITE_OTHER): Payer: BLUE CROSS/BLUE SHIELD | Admitting: *Deleted

## 2018-05-14 DIAGNOSIS — M810 Age-related osteoporosis without current pathological fracture: Secondary | ICD-10-CM

## 2018-05-14 MED ORDER — DENOSUMAB 60 MG/ML ~~LOC~~ SOSY
60.0000 mg | PREFILLED_SYRINGE | Freq: Once | SUBCUTANEOUS | Status: AC
  Administered 2018-05-14: 60 mg via SUBCUTANEOUS

## 2018-05-14 NOTE — Progress Notes (Signed)
Patient presented for Prolia injection to left arm [place], patient voiced no concerns or complaints during or after injection. 

## 2018-06-10 DIAGNOSIS — G43719 Chronic migraine without aura, intractable, without status migrainosus: Secondary | ICD-10-CM | POA: Diagnosis not present

## 2018-06-29 DIAGNOSIS — G43719 Chronic migraine without aura, intractable, without status migrainosus: Secondary | ICD-10-CM | POA: Diagnosis not present

## 2018-06-29 DIAGNOSIS — M791 Myalgia, unspecified site: Secondary | ICD-10-CM | POA: Diagnosis not present

## 2018-06-29 DIAGNOSIS — M542 Cervicalgia: Secondary | ICD-10-CM | POA: Diagnosis not present

## 2018-06-29 DIAGNOSIS — G518 Other disorders of facial nerve: Secondary | ICD-10-CM | POA: Diagnosis not present

## 2018-08-10 DIAGNOSIS — M542 Cervicalgia: Secondary | ICD-10-CM | POA: Diagnosis not present

## 2018-08-10 DIAGNOSIS — G43719 Chronic migraine without aura, intractable, without status migrainosus: Secondary | ICD-10-CM | POA: Diagnosis not present

## 2018-08-10 DIAGNOSIS — G518 Other disorders of facial nerve: Secondary | ICD-10-CM | POA: Diagnosis not present

## 2018-08-10 DIAGNOSIS — M791 Myalgia, unspecified site: Secondary | ICD-10-CM | POA: Diagnosis not present

## 2018-09-09 ENCOUNTER — Encounter: Payer: Self-pay | Admitting: Family Medicine

## 2018-09-09 ENCOUNTER — Ambulatory Visit (INDEPENDENT_AMBULATORY_CARE_PROVIDER_SITE_OTHER): Payer: BLUE CROSS/BLUE SHIELD

## 2018-09-09 ENCOUNTER — Ambulatory Visit: Payer: BLUE CROSS/BLUE SHIELD | Admitting: Family Medicine

## 2018-09-09 VITALS — BP 92/64 | HR 88 | Temp 97.5°F | Resp 16 | Ht 65.0 in | Wt 128.0 lb

## 2018-09-09 DIAGNOSIS — M25561 Pain in right knee: Secondary | ICD-10-CM | POA: Diagnosis not present

## 2018-09-09 DIAGNOSIS — M7121 Synovial cyst of popliteal space [Baker], right knee: Secondary | ICD-10-CM

## 2018-09-09 MED ORDER — MELOXICAM 7.5 MG PO TABS
7.5000 mg | ORAL_TABLET | Freq: Every day | ORAL | 0 refills | Status: DC | PRN
Start: 2018-09-09 — End: 2018-10-13

## 2018-09-09 NOTE — Patient Instructions (Addendum)
Suspect -- Baker Cyst A Baker cyst, also called a popliteal cyst, is a sac-like growth that forms at the back of the knee. The cyst forms when the fluid-filled sac (bursa) that cushions the knee joint becomes enlarged. The bursa that becomes a Baker cyst is located at the back of the knee joint. What are the causes? In most cases, a Baker cyst results from another knee problem that causes swelling inside the knee. This makes the fluid inside the knee joint (synovial fluid) flow into the bursa behind the knee, causing the bursa to enlarge. What increases the risk? You may be more likely to develop a Baker cyst if you already have a knee problem, such as:  A tear in cartilage that cushions the knee joint (meniscal tear).  A tear in the tissues that connect the bones of the knee joint (ligament tear).  Knee swelling from osteoarthritis, rheumatoid arthritis, or gout. What are the signs or symptoms? A Baker cyst does not always cause symptoms. A lump behind the knee may be the only sign of the condition. The lump may be painful, especially when the knee is straightened. If the lump is painful, the pain may come and go. The knee may also be stiff. Symptoms may quickly get more severe if the cyst breaks open (ruptures). If your cyst ruptures, signs and symptoms may affect the knee and the back of the lower leg (calf) and may include:  Sudden or worsening pain.  Swelling.  Bruising. How is this diagnosed? This condition may be diagnosed based on your symptoms and medical history. Your health care provider will also do a physical exam. This may include:  Feeling the cyst to check whether it is tender.  Checking your knee for signs of another knee condition that causes swelling. You may have imaging tests, such as:  X-rays.  MRI.  Ultrasound. How is this treated? A Baker cyst that is not painful may go away without treatment. If the cyst gets large or painful, it will likely get better if  the underlying knee problem is treated. Treatment for a Baker cyst may include:  Resting.  Keeping weight off of the knee. This means not leaning on the knee to support your body weight.  NSAIDs to reduce pain and swelling.  Wearing soft knee brace for joint support  A procedure to drain the fluid from the cyst with a needle (aspiration). You may also get an injection of a medicine that reduces swelling (steroid).  Surgery. This may be needed if other treatments do not work. This usually involves correcting knee damage and removing the cyst. Follow these instructions at home:   Take over-the-counter and prescription medicines only as told by your health care provider.  Rest and return to your normal activities as told by your health care provider. Avoid activities that make pain or swelling worse.   Keep all follow-up visits as told by your health care provider. This is important. Contact a health care provider if:  You have knee pain, stiffness, or swelling that does not get better. Get help right away if:  You have sudden or worsening pain and swelling in your calf area. This information is not intended to replace advice given to you by your health care provider. Make sure you discuss any questions you have with your health care provider. Document Released: 07/15/2005 Document Revised: 04/04/2016 Document Reviewed: 04/04/2016 Elsevier Interactive Patient Education  2019 [employer].

## 2018-09-09 NOTE — Progress Notes (Signed)
Subjective:    Patient ID: Lauren Tanner, female    DOB: June 25, 1956, 63 y.o.   MRN: [id number]   HPI  Patient presents to clinic with right knee pain that began about 10 days ago.  Patient rates the pain at approximately a 3 out of 10.  Currently has not taken anything for pain.  Notices the pain mainly when she puts full weight on knee and also when she is sitting and has to push off the ground to stand up.  Denies any injury.  No falls, no twisting.  Patient has no history of past knee issues however does have documented arthritis in the neck and also is on Prolia for osteoporosis.   Patient Active Problem List   Diagnosis Date Noted  . Dystrophic nail 03/22/2017  . Cervical disc disorder with radiculopathy of cervical region 03/14/2016  . Tremor of both hands 01/22/2014  . Encounter for preventive health examination 11/17/2012  . Postmenopausal atrophic vaginitis 11/17/2012  . GERD (gastroesophageal reflux disease)   . Family history of colon cancer requiring screening colonoscopy   . Reflux esophagitis 10/11/2011  . Osteoporosis, post-menopausal 10/11/2011  . Uterine polyp 10/11/2011  . Chronic interstitial cystitis 10/11/2011  . Depression, major, in partial remission (HCC) 10/11/2011  . Migraine headache 10/11/2011  . SVT (supraventricular tachycardia) (HCC)   . Diverticulosis    Social History   Tobacco Use  . Smoking status: Never Smoker  . Smokeless tobacco: Never Used  Substance Use Topics  . Alcohol use: No   Review of Systems  Constitutional: Negative for chills, fatigue and fever.  HENT: Negative for congestion, ear pain, sinus pain and sore throat.   Eyes: Negative.   Respiratory: Negative for cough, shortness of breath and wheezing.   Cardiovascular: Negative for chest pain, palpitations and leg swelling.  Gastrointestinal: Negative for abdominal pain, diarrhea, nausea and vomiting.  Genitourinary: Negative for dysuria, frequency and urgency.    Musculoskeletal: +right knee pain Skin: Negative for color change, pallor and rash.  Neurological: Negative for syncope, light-headedness and headaches.  Psychiatric/Behavioral: The patient is not nervous/anxious.       Objective:   Physical Exam Vitals signs and nursing note reviewed.  Constitutional:      General: She is not in acute distress.    Appearance: She is not toxic-appearing.  HENT:     Head: Normocephalic and atraumatic.  Cardiovascular:     Rate and Rhythm: Normal rate and regular rhythm.     Pulses: Normal pulses.  Pulmonary:     Effort: Pulmonary effort is normal. No respiratory distress.     Breath sounds: Normal breath sounds.  Musculoskeletal:     Right knee: She exhibits normal range of motion, no swelling and no ecchymosis. Tenderness found.     Right lower leg: No edema.     Left lower leg: No edema.       Legs:     Comments: Patient able to fully extend and flex knee.  Patient has pain behind left knee with full flexion.  Negative anterior posterior drawer test.  Negative McMurray's test.  I do palpate a cystlike area behind the knee, approximately size of a ping-pong ball, suspect it is a Baker's cyst.  Skin:    General: Skin is warm and dry.     Coloration: Skin is not pale.  Neurological:     Mental Status: She is alert and oriented to person, place, and time.     Motor: No weakness.  Coordination: Coordination normal.     Gait: Gait normal.  Psychiatric:        Mood and Affect: Mood normal.        Behavior: Behavior normal.      Vitals:   09/09/18 1104  BP: 92/64  Pulse: 88  Resp: 16  Temp: (!) 97.5 F (36.4 C)  SpO2: 97%      Assessment & Plan:   Acute right knee pain, Baker's cyst of knee - due to physical exam suspect knee pain is related to Baker's cyst and possibly arthritis.  We will do x-ray to further evaluate.  She will use low-dose Mobic once daily as needed to help reduce pain, also advised she can use Tylenol in  addition to the Mobic for pain.  Advised to wear a soft knee brace for added joint support and to rest knee by elevating leg.  Also advised she can do topical rub such as BenGay or Biofreeze, and use ice pack as needed.  Discussed with patient that referral to orthopedics is also possibility if more conservative treatments do not help pain.  Results of x-ray will also help better determine if the area is a Baker's cyst and if arthritis is present.

## 2018-09-10 ENCOUNTER — Encounter: Payer: Self-pay | Admitting: Family Medicine

## 2018-09-10 DIAGNOSIS — G43719 Chronic migraine without aura, intractable, without status migrainosus: Secondary | ICD-10-CM | POA: Diagnosis not present

## 2018-10-07 DIAGNOSIS — G518 Other disorders of facial nerve: Secondary | ICD-10-CM | POA: Diagnosis not present

## 2018-10-07 DIAGNOSIS — G43719 Chronic migraine without aura, intractable, without status migrainosus: Secondary | ICD-10-CM | POA: Diagnosis not present

## 2018-10-07 DIAGNOSIS — M542 Cervicalgia: Secondary | ICD-10-CM | POA: Diagnosis not present

## 2018-10-07 DIAGNOSIS — M791 Myalgia, unspecified site: Secondary | ICD-10-CM | POA: Diagnosis not present

## 2018-10-09 ENCOUNTER — Other Ambulatory Visit: Payer: Self-pay | Admitting: Family Medicine

## 2018-10-09 DIAGNOSIS — M25561 Pain in right knee: Secondary | ICD-10-CM

## 2018-10-30 ENCOUNTER — Other Ambulatory Visit: Payer: Self-pay | Admitting: Internal Medicine

## 2018-11-12 ENCOUNTER — Telehealth: Payer: Self-pay | Admitting: Internal Medicine

## 2018-11-12 NOTE — Telephone Encounter (Signed)
Insurance verification for Prolia filed on [employer] Portal. 

## 2018-11-13 NOTE — Telephone Encounter (Signed)
Left message to return call to office time to schedule Prolia injection.  Injection in office.

## 2018-11-17 ENCOUNTER — Telehealth: Payer: Self-pay | Admitting: Internal Medicine

## 2018-11-17 DIAGNOSIS — G518 Other disorders of facial nerve: Secondary | ICD-10-CM | POA: Diagnosis not present

## 2018-11-17 DIAGNOSIS — G43719 Chronic migraine without aura, intractable, without status migrainosus: Secondary | ICD-10-CM | POA: Diagnosis not present

## 2018-11-17 DIAGNOSIS — M542 Cervicalgia: Secondary | ICD-10-CM | POA: Diagnosis not present

## 2018-11-17 DIAGNOSIS — M791 Myalgia, unspecified site: Secondary | ICD-10-CM | POA: Diagnosis not present

## 2018-11-19 ENCOUNTER — Encounter: Payer: Self-pay | Admitting: Family Medicine

## 2018-11-19 ENCOUNTER — Ambulatory Visit (INDEPENDENT_AMBULATORY_CARE_PROVIDER_SITE_OTHER): Payer: BLUE CROSS/BLUE SHIELD

## 2018-11-19 ENCOUNTER — Other Ambulatory Visit: Payer: Self-pay

## 2018-11-19 DIAGNOSIS — M25561 Pain in right knee: Secondary | ICD-10-CM

## 2018-11-19 DIAGNOSIS — M7121 Synovial cyst of popliteal space [Baker], right knee: Secondary | ICD-10-CM

## 2018-11-19 DIAGNOSIS — M810 Age-related osteoporosis without current pathological fracture: Secondary | ICD-10-CM

## 2018-11-19 MED ORDER — DENOSUMAB 60 MG/ML ~~LOC~~ SOSY
60.0000 mg | PREFILLED_SYRINGE | Freq: Once | SUBCUTANEOUS | Status: AC
  Administered 2018-11-19: 13:00:00 60 mg via SUBCUTANEOUS

## 2018-11-19 NOTE — Progress Notes (Signed)
Patient presented today for Prolia injection.  Administered [place] in right arm.  Patient tolerated well.   

## 2018-11-24 NOTE — Telephone Encounter (Signed)
Lauren Tanner,  This patient was wondering about her ortho referral  I told her she may not hear back as quickly due to COVID  Not sure if you have heard anything?  Thanks! LG

## 2018-12-15 DIAGNOSIS — M25561 Pain in right knee: Secondary | ICD-10-CM | POA: Diagnosis not present

## 2018-12-15 DIAGNOSIS — M1711 Unilateral primary osteoarthritis, right knee: Secondary | ICD-10-CM | POA: Diagnosis not present

## 2018-12-29 DIAGNOSIS — G43719 Chronic migraine without aura, intractable, without status migrainosus: Secondary | ICD-10-CM | POA: Diagnosis not present

## 2018-12-29 DIAGNOSIS — M542 Cervicalgia: Secondary | ICD-10-CM | POA: Diagnosis not present

## 2019-02-09 DIAGNOSIS — M542 Cervicalgia: Secondary | ICD-10-CM | POA: Diagnosis not present

## 2019-02-09 DIAGNOSIS — G43719 Chronic migraine without aura, intractable, without status migrainosus: Secondary | ICD-10-CM | POA: Diagnosis not present

## 2019-03-29 DIAGNOSIS — G43719 Chronic migraine without aura, intractable, without status migrainosus: Secondary | ICD-10-CM | POA: Diagnosis not present

## 2019-03-29 DIAGNOSIS — M542 Cervicalgia: Secondary | ICD-10-CM | POA: Diagnosis not present

## 2019-04-29 ENCOUNTER — Telehealth: Payer: Self-pay | Admitting: Internal Medicine

## 2019-04-29 NOTE — Telephone Encounter (Signed)
Patient due for Prolia injection on or after 05/21/19 can be scheduled insurance verification approved. Please schedule.

## 2019-05-10 DIAGNOSIS — M542 Cervicalgia: Secondary | ICD-10-CM | POA: Diagnosis not present

## 2019-05-10 DIAGNOSIS — G43719 Chronic migraine without aura, intractable, without status migrainosus: Secondary | ICD-10-CM | POA: Diagnosis not present

## 2019-05-28 ENCOUNTER — Other Ambulatory Visit: Payer: Self-pay

## 2019-05-28 ENCOUNTER — Ambulatory Visit (INDEPENDENT_AMBULATORY_CARE_PROVIDER_SITE_OTHER): Payer: BC Managed Care – PPO | Admitting: *Deleted

## 2019-05-28 DIAGNOSIS — M810 Age-related osteoporosis without current pathological fracture: Secondary | ICD-10-CM

## 2019-05-28 MED ORDER — DENOSUMAB 60 MG/ML ~~LOC~~ SOSY
60.0000 mg | PREFILLED_SYRINGE | Freq: Once | SUBCUTANEOUS | Status: AC
  Administered 2019-05-28: 60 mg via SUBCUTANEOUS

## 2019-05-28 NOTE — Progress Notes (Signed)
Patient presented for Prolia injection to Right arm [place], patient voiced no concerns or complaints during or after injection. 

## 2019-06-15 ENCOUNTER — Other Ambulatory Visit: Payer: Self-pay

## 2019-06-15 NOTE — Telephone Encounter (Signed)
Refilled: 10/30/2018 Last OV: 06/24/2017 Next OV: not scheduled

## 2019-06-16 MED ORDER — FAMOTIDINE 40 MG PO TABS: 40.0000 mg | ORAL_TABLET | Freq: Every day | ORAL | 1 refills | Status: DC

## 2019-06-21 DIAGNOSIS — G43719 Chronic migraine without aura, intractable, without status migrainosus: Secondary | ICD-10-CM | POA: Diagnosis not present

## 2019-06-21 DIAGNOSIS — M542 Cervicalgia: Secondary | ICD-10-CM | POA: Diagnosis not present

## 2019-07-02 IMAGING — DX DG KNEE COMPLETE 4+V*R*
5 series · 5 of 5 positions shown · non-contrast
Comparison: None.

CLINICAL DATA: Right knee pain.

EXAM:
RIGHT KNEE - COMPLETE 4+ VIEW

[knee standing ap]
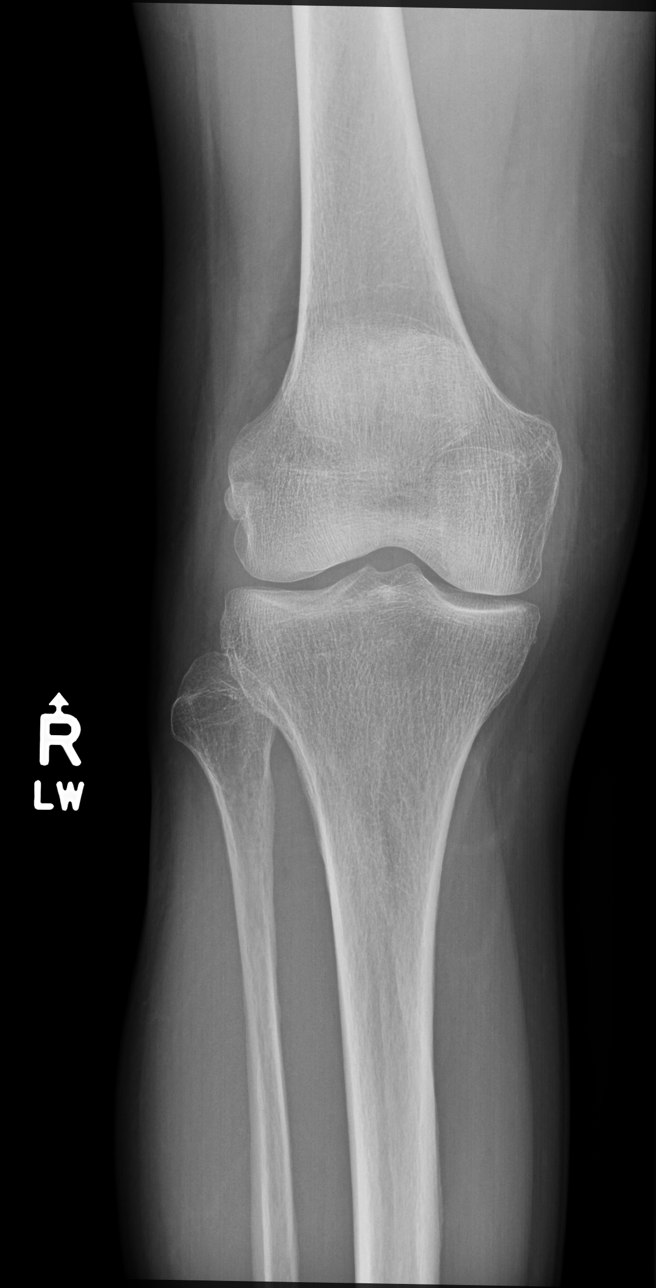

[knee standing external ap]
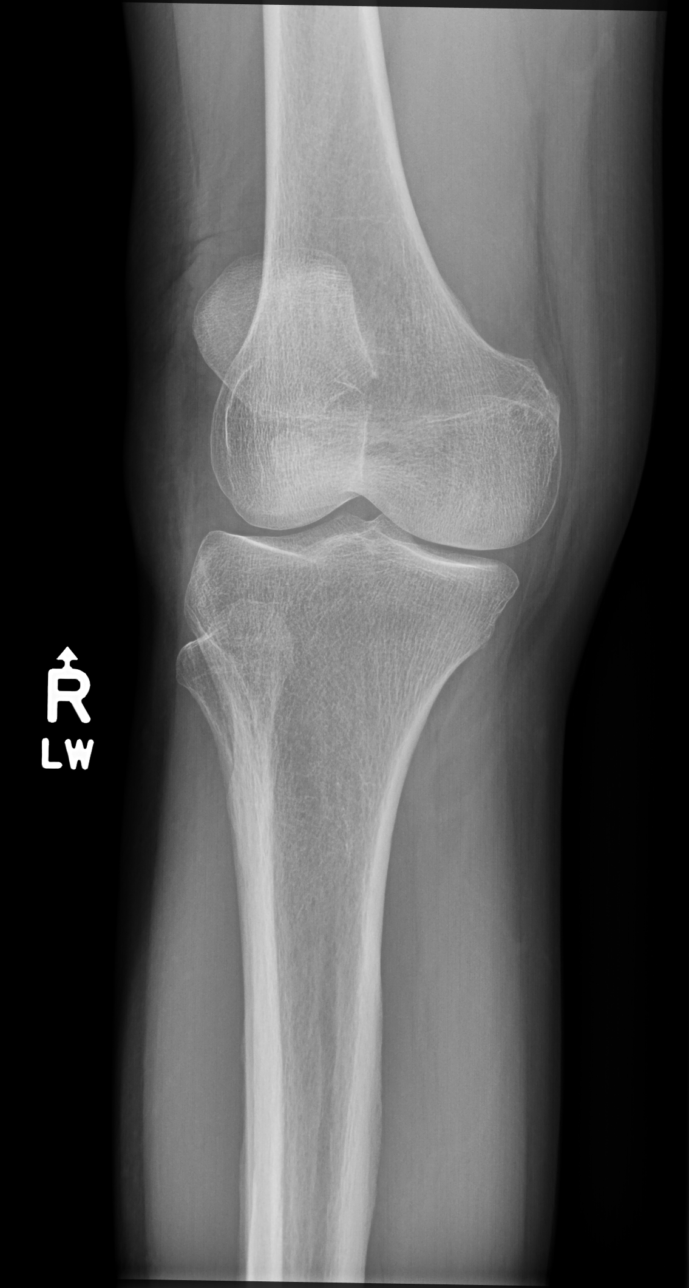

[knee standing internal ap]
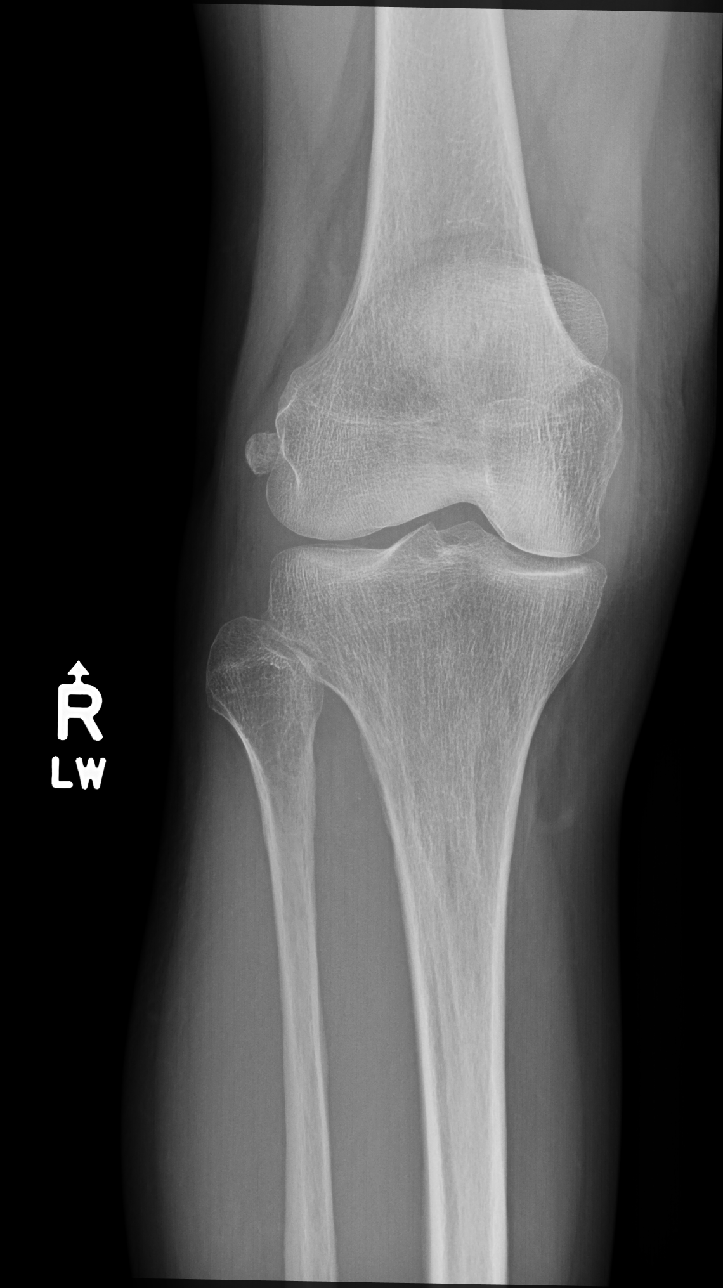

[knee standing lat]
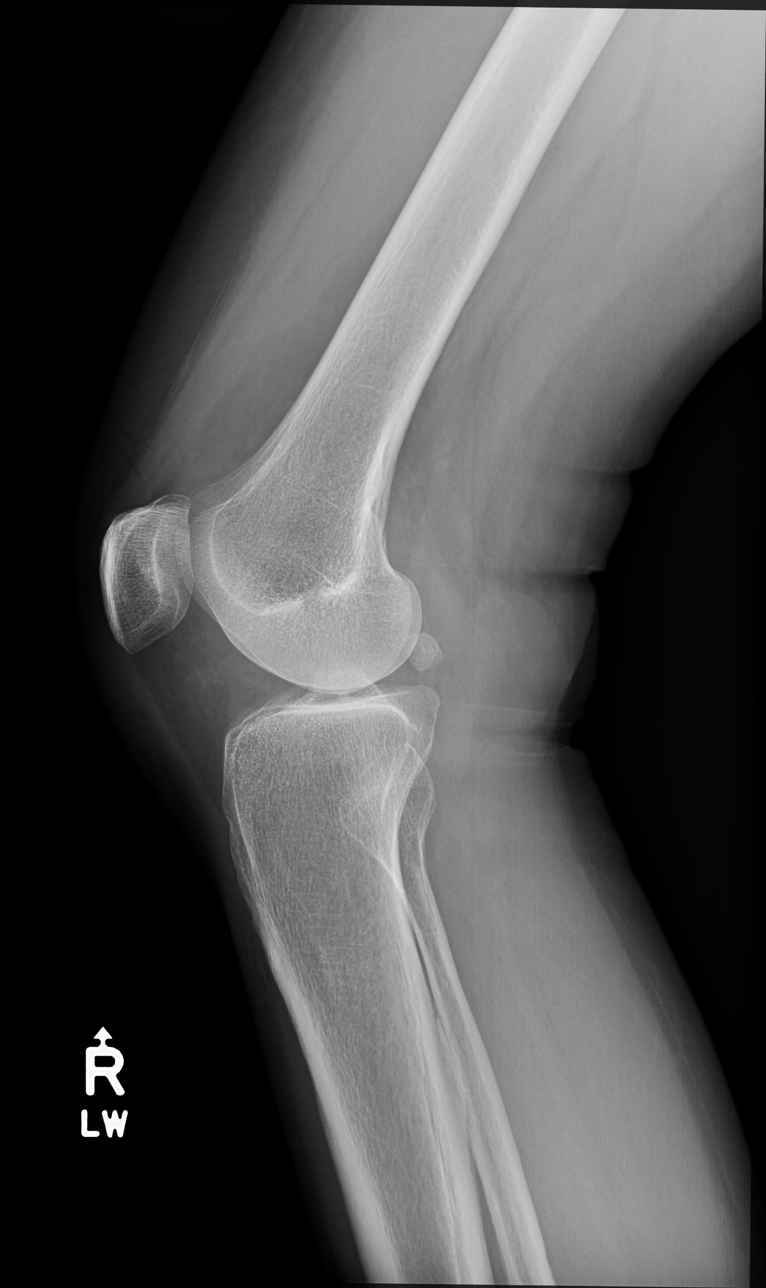

[knee [person_name] view pa]
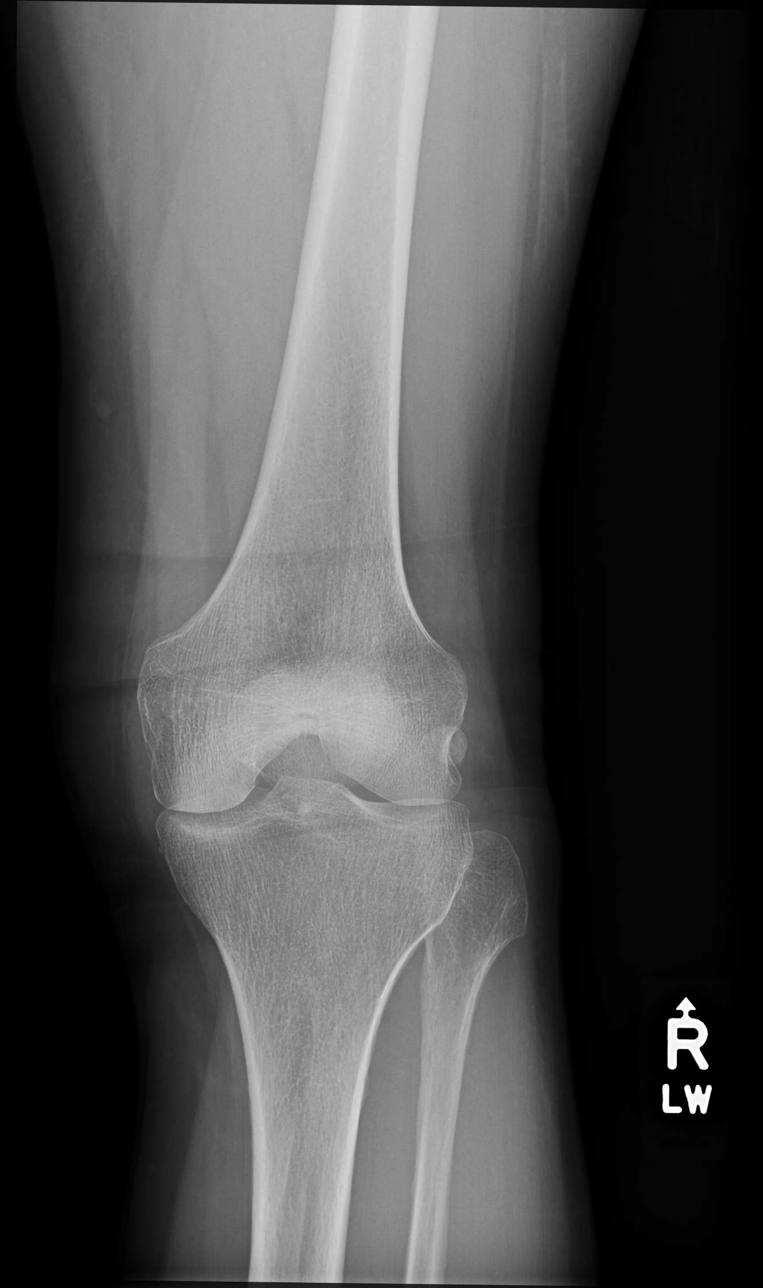

[5 of 5 positions shown; findings below may reference images not displayed]

FINDINGS: Mild joint space narrowing but no spurring changes or subchondral
cysts. No acute bony abnormality or osteochondral lesion. No
chondrocalcinosis or erosions. No joint effusion.

There does appear to be a bulging soft tissue lesion in the
popliteal fossa which could be a Baker's cyst. Ultrasound or MRI may
be helpful for further evaluation.
IMPRESSION: No acute bony findings or significant degenerative changes. No joint
effusion.

Rounded soft tissue density in the popliteal fossa region could be a
Baker's cyst. Ultrasound or MRI may be helpful for further
evaluation.

## 2019-07-06 DIAGNOSIS — Z1231 Encounter for screening mammogram for malignant neoplasm of breast: Secondary | ICD-10-CM | POA: Diagnosis not present

## 2019-07-06 DIAGNOSIS — Z9289 Personal history of other medical treatment: Secondary | ICD-10-CM | POA: Diagnosis not present

## 2019-07-06 LAB — HM MAMMOGRAPHY

## 2019-07-08 ENCOUNTER — Telehealth: Payer: Self-pay | Admitting: Internal Medicine

## 2019-08-09 DIAGNOSIS — G43719 Chronic migraine without aura, intractable, without status migrainosus: Secondary | ICD-10-CM | POA: Diagnosis not present

## 2019-08-09 DIAGNOSIS — M542 Cervicalgia: Secondary | ICD-10-CM | POA: Diagnosis not present

## 2019-08-11 DIAGNOSIS — F319 Bipolar disorder, unspecified: Secondary | ICD-10-CM | POA: Diagnosis not present

## 2019-09-16 ENCOUNTER — Encounter: Payer: BC Managed Care – PPO | Admitting: Internal Medicine

## 2019-09-22 DIAGNOSIS — G43719 Chronic migraine without aura, intractable, without status migrainosus: Secondary | ICD-10-CM | POA: Diagnosis not present

## 2019-09-22 DIAGNOSIS — M542 Cervicalgia: Secondary | ICD-10-CM | POA: Diagnosis not present

## 2019-09-30 DIAGNOSIS — Z23 Encounter for immunization: Secondary | ICD-10-CM | POA: Diagnosis not present

## 2019-10-11 ENCOUNTER — Telehealth: Payer: Self-pay | Admitting: Internal Medicine

## 2019-10-11 NOTE — Telephone Encounter (Signed)
Insurance verification for Prolia filed on [employer] Portal. 

## 2019-10-21 ENCOUNTER — Other Ambulatory Visit: Payer: Self-pay

## 2019-10-21 ENCOUNTER — Ambulatory Visit (INDEPENDENT_AMBULATORY_CARE_PROVIDER_SITE_OTHER): Payer: BC Managed Care – PPO | Admitting: Internal Medicine

## 2019-10-21 ENCOUNTER — Encounter: Payer: Self-pay | Admitting: Internal Medicine

## 2019-10-21 VITALS — BP 124/86 | HR 86 | Temp 97.8°F | Resp 16 | Ht 65.0 in | Wt 127.8 lb

## 2019-10-21 DIAGNOSIS — E782 Mixed hyperlipidemia: Secondary | ICD-10-CM

## 2019-10-21 DIAGNOSIS — R5383 Other fatigue: Secondary | ICD-10-CM | POA: Diagnosis not present

## 2019-10-21 DIAGNOSIS — Z0000 Encounter for general adult medical examination without abnormal findings: Secondary | ICD-10-CM | POA: Diagnosis not present

## 2019-10-21 DIAGNOSIS — F3341 Major depressive disorder, recurrent, in partial remission: Secondary | ICD-10-CM

## 2019-10-21 DIAGNOSIS — I471 Supraventricular tachycardia: Secondary | ICD-10-CM

## 2019-10-21 DIAGNOSIS — Z800 Family history of malignant neoplasm of digestive organs: Secondary | ICD-10-CM

## 2019-10-21 DIAGNOSIS — M810 Age-related osteoporosis without current pathological fracture: Secondary | ICD-10-CM | POA: Diagnosis not present

## 2019-10-21 DIAGNOSIS — N840 Polyp of corpus uteri: Secondary | ICD-10-CM

## 2019-10-21 DIAGNOSIS — Z124 Encounter for screening for malignant neoplasm of cervix: Secondary | ICD-10-CM

## 2019-10-21 DIAGNOSIS — E785 Hyperlipidemia, unspecified: Secondary | ICD-10-CM

## 2019-10-21 LAB — LIPID PANEL
Cholesterol: 210 mg/dL — ABNORMAL HIGH (ref 0–200)
HDL: 50.8 mg/dL (ref >39.00)
LDL Cholesterol: 140 mg/dL — ABNORMAL HIGH (ref 0–99)
NonHDL: 159.15
Total CHOL/HDL Ratio: 4
Triglycerides: 96 mg/dL (ref 0.0–149.0)
VLDL: 19.2 mg/dL (ref 0.0–40.0)

## 2019-10-21 LAB — CBC WITH DIFFERENTIAL/PLATELET
Basophils Absolute: 0 10*3/uL (ref 0.0–0.1)
Basophils Relative: 0.4 % (ref 0.0–3.0)
Eosinophils Absolute: 0 10*3/uL (ref 0.0–0.7)
Eosinophils Relative: 0 % (ref 0.0–5.0)
HCT: 34.5 % — ABNORMAL LOW (ref 36.0–46.0)
Hemoglobin: 11.9 g/dL — ABNORMAL LOW (ref 12.0–15.0)
Lymphocytes Relative: 32.4 % (ref 12.0–46.0)
Lymphs Abs: 1.9 10*3/uL (ref 0.7–4.0)
MCHC: 34.4 g/dL (ref 30.0–36.0)
MCV: 94 fl (ref 78.0–100.0)
Monocytes Absolute: 0.3 10*3/uL (ref 0.1–1.0)
Monocytes Relative: 5.9 % (ref 3.0–12.0)
Neutro Abs: 3.5 10*3/uL (ref 1.4–7.7)
Neutrophils Relative %: 61.3 % (ref 43.0–77.0)
Platelets: 281 10*3/uL (ref 150.0–400.0)
RBC: 3.67 Mil/uL — ABNORMAL LOW (ref 3.87–5.11)
RDW: 12.4 % (ref 11.5–15.5)
WBC: 5.7 10*3/uL (ref 4.0–10.5)

## 2019-10-21 LAB — COMPREHENSIVE METABOLIC PANEL
ALT: 31 U/L (ref 0–35)
AST: 24 U/L (ref 0–37)
Albumin: 4.3 g/dL (ref 3.5–5.2)
Alkaline Phosphatase: 51 U/L (ref 39–117)
BUN: 20 mg/dL (ref 6–23)
CO2: 28 mEq/L (ref 19–32)
Calcium: 9.1 mg/dL (ref 8.4–10.5)
Chloride: 105 mEq/L (ref 96–112)
Creatinine, Ser: 0.91 mg/dL (ref 0.40–1.20)
GFR: 62.46 mL/min (ref >60.00)
Glucose, Bld: 94 mg/dL (ref 70–99)
Potassium: 4.3 mEq/L (ref 3.5–5.1)
Sodium: 137 mEq/L (ref 135–145)
Total Bilirubin: 0.4 mg/dL (ref 0.2–1.2)
Total Protein: 6.9 g/dL (ref 6.0–8.3)

## 2019-10-21 LAB — TSH: TSH: 1.66 u[IU]/mL (ref 0.35–4.50)

## 2019-10-21 NOTE — Progress Notes (Signed)
Patient ID: Lauren Tanner, female    DOB: [birth year]  Age: 64 y.o. MRN: [id number]  The patient is here for annual preventive  examination and management of other chronic and acute problems.  This visit occurred during the SARS-CoV-2 public health emergency.  Safety protocols were in place, including screening questions prior to the visit, additional usage of staff PPE, and extensive cleaning of exam room while observing appropriate contact time as indicated for disinfecting solutions.      The risk factors are reflected in the social history.  The roster of all physicians providing medical care to patient - is listed in the Snapshot section of the chart.  Activities of daily living:  The patient is 100% independent in all ADLs: dressing, toileting, feeding as well as independent mobility  Home safety : The patient has smoke detectors in the home. They wear seatbelts.  There are no firearms at home. There is no violence in the home.   There is no risks for hepatitis, STDs or HIV. There is no   history of blood transfusion. They have no travel history to infectious disease endemic areas of the world.  The patient has seen their dentist in the last six month. They have seen their eye doctor in the last year. She denies hearing difficulty and has  deferred audiologic testing in the last year.  They do not  have excessive sun exposure. Discussed the need for sun protection: hats, long sleeves and use of sunscreen if there is significant sun exposure.   Diet: the importance of a healthy diet is discussed. They do have a healthy diet.  The benefits of regular aerobic exercise were discussed. She walks 4 times per week ,  20 minutes.   Depression screen: there are no signs or vegative symptoms of depression- irritability, change in appetite, anhedonia, sadness/tearfullness.  Cognitive assessment: the patient manages all their financial and personal affairs and is actively engaged. They could relate  day,date,year and events; recalled 2/3 objects at 3 minutes; performed clock-face test normally.  The following portions of the patient's history were reviewed and updated as appropriate: allergies, current medications, past family history, past medical history,  past surgical history, past social history  and problem list.  Visual acuity was not assessed per patient preference since she has regular follow up with her ophthalmologist. Hearing and body mass index were assessed and reviewed.   During the course of the visit the patient was educated and counseled about appropriate screening and preventive services including : fall prevention , diabetes screening, nutrition counseling, colorectal cancer screening, and recommended immunizations.    CC: The primary encounter diagnosis was Osteoporosis, post-menopausal. Diagnoses of Fatigue, unspecified type, Moderate mixed hyperlipidemia not requiring statin therapy, Family history of colon cancer requiring screening colonoscopy, Recurrent major depressive disorder, in partial remission ([place]), SVT (supraventricular tachycardia) ([place]), Encounter for preventive health examination, Hyperlipidemia LDL goal <160, Cervical cancer screening, and Uterine polyp were also pertinent to this visit.  History Lauren Tanner has a past medical history of Depression, Diverticulosis, Dysrhythmia, GERD (gastroesophageal reflux disease), Headache disorder, History of colonic polyps (2011), and SVT (supraventricular tachycardia) ([place]).   She has a past surgical history that includes Colonoscopy with propofol (N/A, 05/19/2015).   Her family history includes Cancer (age of onset: 53) in her father; Mental illness in her mother.She reports that she has never smoked. She has never used smokeless tobacco. She reports that she does not drink alcohol or use drugs.  Outpatient Medications Prior to Visit  Medication Sig Dispense Refill  . AIMOVIG 70 MG/ML SOAJ SMARTSIG:70 Milligram(s) SUB-Q  Once a Month    . buPROPion (WELLBUTRIN XL) 300 MG 24 hr tablet Take 300 mg by mouth daily.    . clonazePAM (KLONOPIN) 0.5 MG tablet   0  . denosumab (PROLIA) 60 MG/ML SOLN injection Inject 60 mg into the skin once. Administer in upper arm, thigh, or abdomen 1 Syringe 1  . diazepam (VALIUM) 5 MG tablet Take 1 tablet (5 mg total) by mouth every 12 (twelve) hours as needed for anxiety. 10 tablet 1  . diltiazem (CARDIZEM) 60 MG tablet Take 1 tablet (60 mg total) by mouth 4 (four) times daily. 30 tablet 0  . famotidine (PEPCID) 40 MG tablet Take 1 tablet (40 mg total) by mouth daily. 90 tablet 1  . imipramine (TOFRANIL) 50 MG tablet Take 50 mg by mouth 2 (two) times daily.    Marland Kitchen lamoTRIgine (LAMICTAL) 100 MG tablet Take 100 mg by mouth 3 (three) times daily.    . meloxicam (MOBIC) 7.5 MG tablet TAKE 1 TABLET BY MOUTH EVERY DAY AS NEEDED FOR PAIN 30 tablet 0  . naratriptan (AMERGE) 2.5 MG tablet Take 2.5 mg by mouth daily as needed.     No facility-administered medications prior to visit.    Review of Systems   Patient denies headache, fevers, malaise, unintentional weight loss, skin rash, eye pain, sinus congestion and sinus pain, sore throat, dysphagia,  hemoptysis , cough, dyspnea, wheezing, chest pain, palpitations, orthopnea, edema, abdominal pain, nausea, melena, diarrhea, constipation, flank pain, dysuria, hematuria, urinary  Frequency, nocturia, numbness, tingling, seizures,  Focal weakness, Loss of consciousness,  Tremor, insomnia, depression, anxiety, and suicidal ideation.      Objective:  BP 124/86 (BP Location: Left Arm, Patient Position: Sitting, Cuff Size: Normal)   Pulse 86   Temp 97.8 F (36.6 C) (Temporal)   Resp 16   Ht 5\' 5"  (1.651 m)   Wt 127 lb 12.8 oz (58 kg)   SpO2 98%   BMI 21.27 kg/m   Physical Exam  General appearance: alert, cooperative and appears stated age Head: Normocephalic, without obvious abnormality, atraumatic Eyes: conjunctivae/corneas clear.  PERRL, EOM's intact. Fundi benign. Ears: normal TM's and external ear canals both ears Nose: Nares normal. Septum midline. Mucosa normal. No drainage or sinus tenderness. Throat: lips, mucosa, and tongue normal; teeth and gums normal Neck: no adenopathy, no carotid bruit, no JVD, supple, symmetrical, trachea midline and thyroid not enlarged, symmetric, no tenderness/mass/nodules Lungs: clear to auscultation bilaterally Breasts: normal appearance, no masses or tenderness Heart: regular rate and rhythm, S1, S2 normal, no murmur, click, rub or gallop Abdomen: soft, non-tender; bowel sounds normal; no masses,  no organomegaly Extremities: extremities normal, atraumatic, no cyanosis or edema Pulses: 2+ and symmetric Skin: Skin color, texture, turgor normal. No rashes or lesions Neurologic: Alert and oriented X 3, normal strength and tone. Normal symmetric reflexes. Normal coordination and gait.    Assessment & Plan:   Problem List Items Addressed This Visit      Unprioritized   SVT (supraventricular tachycardia) (HCC)    Rare , managed with prn cardizem  Has not used in over a year       Osteoporosis, post-menopausal - Primary    On prolia since 2014.  Needs DEXA uNC imaging       Relevant Orders   DG Bone Density   Uterine polyp   Depression, major, in partial remission (HCC)    Her symptoms  have been managed with [facility] psychiatrist with wellbutrin and lamictal.        Family history of colon cancer requiring screening colonoscopy    Her last scope was in 2016  She is due for 5 yr follow up screening       Relevant Orders   Ambulatory referral to Gastroenterology   Encounter for preventive health examination    age appropriate education and counseling updated, referrals for preventative services and immunizations addressed, dietary and smoking counseling addressed, most recent labs reviewed.  I have personally reviewed and have noted:  1) the patient's medical and social  history 2) The pt's use of alcohol, tobacco, and illicit drugs 3) The patient's current medications and supplements 4) Functional ability including ADL's, fall risk, home safety risk, hearing and visual impairment 5) Diet and physical activities 6) Evidence for depression or mood disorder 7) The patient's height, weight, and BMI have been recorded in the chart  I have made referrals, and provided counseling and education based on review of the above      Hyperlipidemia LDL goal <160    Using the Framingham risk calculator,  her 10 year risk of coronary artery disease is 7.4%. No treatment indicated  Lab Results  Component Value Date   CHOL 210 (H) 10/21/2019   HDL 50.80 10/21/2019   LDLCALC 140 (H) 10/21/2019   LDLDIRECT 135.9 11/17/2012   TRIG 96.0 10/21/2019   CHOLHDL 4 10/21/2019         Cervical cancer screening    Routine surveillance every 3-5 years,  Next one due march 2022        Other Visit Diagnoses    Fatigue, unspecified type       Relevant Orders   Comprehensive metabolic panel (Completed)   TSH (Completed)   CBC with Differential/Platelet (Completed)   Moderate mixed hyperlipidemia not requiring statin therapy       Relevant Orders   Lipid panel (Completed)      I am having Lauren Tanner maintain her lamoTRIgine, buPROPion, imipramine, denosumab, diltiazem, clonazePAM, diazepam, meloxicam, famotidine, Aimovig, and naratriptan.  No orders of the defined types were placed in this encounter.   There are no discontinued medications.  Follow-up: No follow-ups on file.   Sherlene Shams, MD

## 2019-10-21 NOTE — Assessment & Plan Note (Addendum)
Her last scope was in 2016  She is due for 5 yr follow up screening

## 2019-10-21 NOTE — Assessment & Plan Note (Signed)
On prolia since 2014.  Needs DEXA uNC imaging

## 2019-10-21 NOTE — Patient Instructions (Signed)
Your last PAP smear was normal in 2018.  We will repeat it next CPE   Your colonoscopy is due this year.  Let me know if you prefer a female gastroenterologist .  You should have a repeat DEXA scan this year.  It has been ordered  send me a reminder  about your daughter's need for fasting labs   Health Maintenance for Postmenopausal Women Menopause is a normal process in which your ability to get pregnant comes to an end. This process happens slowly over many months or years, usually between the ages of 43 and 8. Menopause is complete when you have missed your menstrual periods for 12 months. It is important to talk with your health care provider about some of the most common conditions that affect women after menopause (postmenopausal women). These include heart disease, cancer, and bone loss (osteoporosis). Adopting a healthy lifestyle and getting preventive care can help to promote your health and wellness. The actions you take can also lower your chances of developing some of these common conditions. What should I know about menopause? During menopause, you may get a number of symptoms, such as:  Hot flashes. These can be moderate or severe.  Night sweats.  Decrease in sex drive.  Mood swings.  Headaches.  Tiredness.  Irritability.  Memory problems.  Insomnia. Choosing to treat or not to treat these symptoms is a decision that you make with your health care provider. Do I need hormone replacement therapy?  Hormone replacement therapy is effective in treating symptoms that are caused by menopause, such as hot flashes and night sweats.  Hormone replacement carries certain risks, especially as you become older. If you are thinking about using estrogen or estrogen with progestin, discuss the benefits and risks with your health care provider. What is my risk for heart disease and stroke? The risk of heart disease, heart attack, and stroke increases as you age. One of the causes  may be a change in the body's hormones during menopause. This can affect how your body uses dietary fats, triglycerides, and cholesterol. Heart attack and stroke are medical emergencies. There are many things that you can do to help prevent heart disease and stroke. Watch your blood pressure  High blood pressure causes heart disease and increases the risk of stroke. This is more likely to develop in people who have high blood pressure readings, are of African descent, or are overweight.  Have your blood pressure checked: ? Every 3-5 years if you are 34-63 years of age. ? Every year if you are 5 years old or older. Eat a healthy diet   Eat a diet that includes plenty of vegetables, fruits, low-fat dairy products, and lean protein.  Do not eat a lot of foods that are high in solid fats, added sugars, or sodium. Get regular exercise Get regular exercise. This is one of the most important things you can do for your health. Most adults should:  Try to exercise for at least 150 minutes each week. The exercise should increase your heart rate and make you sweat (moderate-intensity exercise).  Try to do strengthening exercises at least twice each week. Do these in addition to the moderate-intensity exercise.  Spend less time sitting. Even light physical activity can be beneficial. Other tips  Work with your health care provider to achieve or maintain a healthy weight.  Do not use any products that contain nicotine or tobacco, such as cigarettes, e-cigarettes, and chewing tobacco. If you need help quitting,  ask your health care provider.  Know your numbers. Ask your health care provider to check your cholesterol and your blood sugar (glucose). Continue to have your blood tested as directed by your health care provider. Do I need screening for cancer? Depending on your health history and family history, you may need to have cancer screening at different stages of your life. This may include  screening for:  Breast cancer.  Cervical cancer.  Lung cancer.  Colorectal cancer. What is my risk for osteoporosis? After menopause, you may be at increased risk for osteoporosis. Osteoporosis is a condition in which bone destruction happens more quickly than new bone creation. To help prevent osteoporosis or the bone fractures that can happen because of osteoporosis, you may take the following actions:  If you are 56-52 years old, get at least 1,000 mg of calcium and at least 600 mg of vitamin D per day.  If you are older than age 45 but younger than age 15, get at least 1,200 mg of calcium and at least 600 mg of vitamin D per day.  If you are older than age 40, get at least 1,200 mg of calcium and at least 800 mg of vitamin D per day. Smoking and drinking excessive alcohol increase the risk of osteoporosis. Eat foods that are rich in calcium and vitamin D, and do weight-bearing exercises several times each week as directed by your health care provider. How does menopause affect my mental health? Depression may occur at any age, but it is more common as you become older. Common symptoms of depression include:  Low or sad mood.  Changes in sleep patterns.  Changes in appetite or eating patterns.  Feeling an overall lack of motivation or enjoyment of activities that you previously enjoyed.  Frequent crying spells. Talk with your health care provider if you think that you are experiencing depression. General instructions See your health care provider for regular wellness exams and vaccines. This may include:  Scheduling regular health, dental, and eye exams.  Getting and maintaining your vaccines. These include: ? Influenza vaccine. Get this vaccine each year before the flu season begins. ? Pneumonia vaccine. ? Shingles vaccine. ? Tetanus, diphtheria, and pertussis (Tdap) booster vaccine. Your health care provider may also recommend other immunizations. Tell your health care  provider if you have ever been abused or do not feel safe at home. Summary  Menopause is a normal process in which your ability to get pregnant comes to an end.  This condition causes hot flashes, night sweats, decreased interest in sex, mood swings, headaches, or lack of sleep.  Treatment for this condition may include hormone replacement therapy.  Take actions to keep yourself healthy, including exercising regularly, eating a healthy diet, watching your weight, and checking your blood pressure and blood sugar levels.  Get screened for cancer and depression. Make sure that you are up to date with all your vaccines. This information is not intended to replace advice given to you by your health care provider. Make sure you discuss any questions you have with your health care provider. Document Revised: 07/08/2018 Document Reviewed: 07/08/2018 Elsevier Patient Education  2020 [employer].

## 2019-10-24 ENCOUNTER — Encounter: Payer: Self-pay | Admitting: Internal Medicine

## 2019-10-24 DIAGNOSIS — Z124 Encounter for screening for malignant neoplasm of cervix: Secondary | ICD-10-CM | POA: Insufficient documentation

## 2019-10-24 DIAGNOSIS — E785 Hyperlipidemia, unspecified: Secondary | ICD-10-CM | POA: Insufficient documentation

## 2019-10-24 NOTE — Assessment & Plan Note (Signed)
Her symptoms have been managed with [facility] psychiatrist with wellbutrin and lamictal.

## 2019-10-24 NOTE — Assessment & Plan Note (Signed)
Routine surveillance every 3-5 years,  Next one due march 2022

## 2019-10-24 NOTE — Assessment & Plan Note (Signed)
Rare , managed with prn cardizem  Has not used in over a year

## 2019-10-24 NOTE — Assessment & Plan Note (Signed)

## 2019-10-24 NOTE — Assessment & Plan Note (Signed)
Using the Framingham risk calculator,  her 10 year risk of coronary artery disease is 7.4%. No treatment indicated  Lab Results  Component Value Date   CHOL 210 (H) 10/21/2019   HDL 50.80 10/21/2019   LDLCALC 140 (H) 10/21/2019   LDLDIRECT 135.9 11/17/2012   TRIG 96.0 10/21/2019   CHOLHDL 4 10/21/2019

## 2019-10-27 NOTE — Telephone Encounter (Signed)
Lauren Tanner Key: [id number] PA filed on Cover my meds.

## 2019-11-01 DIAGNOSIS — Z23 Encounter for immunization: Secondary | ICD-10-CM | POA: Diagnosis not present

## 2019-11-03 DIAGNOSIS — G43719 Chronic migraine without aura, intractable, without status migrainosus: Secondary | ICD-10-CM | POA: Diagnosis not present

## 2019-11-03 DIAGNOSIS — M542 Cervicalgia: Secondary | ICD-10-CM | POA: Diagnosis not present

## 2019-11-08 ENCOUNTER — Encounter: Payer: Self-pay | Admitting: Internal Medicine

## 2019-11-08 NOTE — Telephone Encounter (Signed)
Left message for patient to return call to office Prolia injection has been approved and ready to schedule [place] for Prolia injection.

## 2019-11-16 ENCOUNTER — Telehealth: Payer: Self-pay | Admitting: Internal Medicine

## 2019-11-16 NOTE — Telephone Encounter (Signed)
Pt returned your call about referral-Please call her work number after 4:15 today [phone]

## 2019-11-17 DIAGNOSIS — F319 Bipolar disorder, unspecified: Secondary | ICD-10-CM | POA: Diagnosis not present

## 2019-11-18 NOTE — Telephone Encounter (Signed)
Thanks

## 2019-11-23 NOTE — Telephone Encounter (Signed)
Sent my chart message to schedule Prolia injection Please schedule.

## 2019-11-24 ENCOUNTER — Telehealth (INDEPENDENT_AMBULATORY_CARE_PROVIDER_SITE_OTHER): Payer: Self-pay | Admitting: Gastroenterology

## 2019-11-24 ENCOUNTER — Other Ambulatory Visit: Payer: Self-pay

## 2019-11-24 DIAGNOSIS — Z1211 Encounter for screening for malignant neoplasm of colon: Secondary | ICD-10-CM

## 2019-11-24 DIAGNOSIS — Z800 Family history of malignant neoplasm of digestive organs: Secondary | ICD-10-CM

## 2019-11-24 NOTE — Progress Notes (Signed)
Gastroenterology Pre-Procedure Review  Request Date: Monday 12/20/19 Requesting Physician: Dr. Maximino Greenland  PATIENT REVIEW QUESTIONS: The patient responded to the following health history questions as indicated:    1. Are you having any GI issues? no 2. Do you have a personal history of Polyps? yes (last one was with Dr. Mechele Collin 2016) 3. Do you have a family history of Colon Cancer or Polyps? yes (father colon cancer) 4. Diabetes Mellitus? no 5. Joint replacements in the past 12 months?no 6. Major health problems in the past 3 months?no 7. Any artificial heart valves, MVP, or defibrillator?no    MEDICATIONS & ALLERGIES:    Patient reports the following regarding taking any anticoagulation/antiplatelet therapy:   Plavix, Coumadin, Eliquis, Xarelto, Lovenox, Pradaxa, Brilinta, or Effient? no Aspirin? no  Patient confirms/reports the following medications:  Current Outpatient Medications  Medication Sig Dispense Refill  . AIMOVIG 70 MG/ML SOAJ SMARTSIG:70 Milligram(s) SUB-Q Once a Month    . buPROPion (WELLBUTRIN XL) 300 MG 24 hr tablet Take 300 mg by mouth daily.    . clonazePAM (KLONOPIN) 0.5 MG tablet   0  . denosumab (PROLIA) 60 MG/ML SOLN injection Inject 60 mg into the skin once. Administer in upper arm, thigh, or abdomen 1 Syringe 1  . famotidine (PEPCID) 40 MG tablet Take 1 tablet (40 mg total) by mouth daily. 90 tablet 1  . imipramine (TOFRANIL) 50 MG tablet Take 50 mg by mouth 2 (two) times daily.    Marland Kitchen lamoTRIgine (LAMICTAL) 100 MG tablet Take 100 mg by mouth 3 (three) times daily.    . diazepam (VALIUM) 5 MG tablet Take 1 tablet (5 mg total) by mouth every 12 (twelve) hours as needed for anxiety. (Patient not taking: Reported on 11/24/2019) 10 tablet 1  . diltiazem (CARDIZEM) 60 MG tablet Take 1 tablet (60 mg total) by mouth 4 (four) times daily. (Patient not taking: Reported on 11/24/2019) 30 tablet 0  . meloxicam (MOBIC) 7.5 MG tablet TAKE 1 TABLET BY MOUTH EVERY DAY AS NEEDED  FOR PAIN (Patient not taking: Reported on 11/24/2019) 30 tablet 0  . naratriptan (AMERGE) 2.5 MG tablet Take 2.5 mg by mouth daily as needed.     No current facility-administered medications for this visit.    Patient confirms/reports the following allergies:  Allergies  Allergen Reactions  . Ampicillin Hives  . Septra [Bactrim] Hives  . Sulfa Antibiotics Hives    No orders of the defined types were placed in this encounter.   AUTHORIZATION INFORMATION Primary Insurance: 1D#: Group #:  Secondary Insurance: 1D#: Group #:  SCHEDULE INFORMATION: Date: 12/20/19 Time: Location:[facility]

## 2019-12-01 DIAGNOSIS — Z780 Asymptomatic menopausal state: Secondary | ICD-10-CM | POA: Diagnosis not present

## 2019-12-01 DIAGNOSIS — M810 Age-related osteoporosis without current pathological fracture: Secondary | ICD-10-CM | POA: Diagnosis not present

## 2019-12-01 DIAGNOSIS — E2839 Other primary ovarian failure: Secondary | ICD-10-CM | POA: Diagnosis not present

## 2019-12-01 DIAGNOSIS — M85852 Other specified disorders of bone density and structure, left thigh: Secondary | ICD-10-CM | POA: Diagnosis not present

## 2019-12-01 DIAGNOSIS — M818 Other osteoporosis without current pathological fracture: Secondary | ICD-10-CM | POA: Diagnosis not present

## 2019-12-01 LAB — HM DEXA SCAN

## 2019-12-02 ENCOUNTER — Ambulatory Visit (INDEPENDENT_AMBULATORY_CARE_PROVIDER_SITE_OTHER): Payer: BC Managed Care – PPO

## 2019-12-02 ENCOUNTER — Other Ambulatory Visit: Payer: Self-pay

## 2019-12-02 DIAGNOSIS — M810 Age-related osteoporosis without current pathological fracture: Secondary | ICD-10-CM

## 2019-12-02 MED ORDER — DENOSUMAB 60 MG/ML ~~LOC~~ SOSY
60.0000 mg | PREFILLED_SYRINGE | Freq: Once | SUBCUTANEOUS | Status: AC
  Administered 2019-12-02: 60 mg via SUBCUTANEOUS

## 2019-12-02 NOTE — Progress Notes (Signed)
Patient presented for 6-month Prolia injection SQ to right arm. Patient tolerated well. 

## 2019-12-16 ENCOUNTER — Other Ambulatory Visit
Admission: RE | Admit: 2019-12-16 | Discharge: 2019-12-16 | Disposition: A | Discharge status: Home / Self Care | Payer: BC Managed Care – PPO | Source: Ambulatory Visit | Attending: Gastroenterology | Admitting: Gastroenterology

## 2019-12-16 DIAGNOSIS — Z20822 Contact with and (suspected) exposure to covid-19: Secondary | ICD-10-CM | POA: Insufficient documentation

## 2019-12-16 DIAGNOSIS — Z01812 Encounter for preprocedural laboratory examination: Secondary | ICD-10-CM | POA: Insufficient documentation

## 2019-12-17 ENCOUNTER — Encounter: Payer: Self-pay | Admitting: Gastroenterology

## 2019-12-17 LAB — SARS CORONAVIRUS 2 (TAT 6-24 HRS): SARS Coronavirus 2: NEGATIVE

## 2019-12-20 ENCOUNTER — Ambulatory Visit
Admission: RE | Admit: 2019-12-20 | Discharge: 2019-12-20 | Disposition: A | Discharge status: Home / Self Care | Payer: BC Managed Care – PPO | Attending: Gastroenterology | Admitting: Gastroenterology

## 2019-12-20 ENCOUNTER — Ambulatory Visit: Payer: BC Managed Care – PPO | Admitting: Anesthesiology

## 2019-12-20 ENCOUNTER — Encounter
Admission: RE | Disposition: A | Discharge status: Home / Self Care | Payer: Self-pay | Source: Home / Self Care | Attending: Gastroenterology

## 2019-12-20 ENCOUNTER — Other Ambulatory Visit: Payer: Self-pay

## 2019-12-20 ENCOUNTER — Encounter: Payer: Self-pay | Admitting: Gastroenterology

## 2019-12-20 DIAGNOSIS — Z882 Allergy status to sulfonamides status: Secondary | ICD-10-CM | POA: Diagnosis not present

## 2019-12-20 DIAGNOSIS — K219 Gastro-esophageal reflux disease without esophagitis: Secondary | ICD-10-CM | POA: Insufficient documentation

## 2019-12-20 DIAGNOSIS — F329 Major depressive disorder, single episode, unspecified: Secondary | ICD-10-CM | POA: Diagnosis not present

## 2019-12-20 DIAGNOSIS — I499 Cardiac arrhythmia, unspecified: Secondary | ICD-10-CM | POA: Insufficient documentation

## 2019-12-20 DIAGNOSIS — Z1211 Encounter for screening for malignant neoplasm of colon: Secondary | ICD-10-CM | POA: Diagnosis not present

## 2019-12-20 DIAGNOSIS — Z86010 Personal history of colonic polyps: Secondary | ICD-10-CM | POA: Insufficient documentation

## 2019-12-20 DIAGNOSIS — Z818 Family history of other mental and behavioral disorders: Secondary | ICD-10-CM | POA: Insufficient documentation

## 2019-12-20 DIAGNOSIS — Z800 Family history of malignant neoplasm of digestive organs: Secondary | ICD-10-CM | POA: Insufficient documentation

## 2019-12-20 DIAGNOSIS — Z8052 Family history of malignant neoplasm of bladder: Secondary | ICD-10-CM | POA: Insufficient documentation

## 2019-12-20 DIAGNOSIS — Z881 Allergy status to other antibiotic agents status: Secondary | ICD-10-CM | POA: Insufficient documentation

## 2019-12-20 DIAGNOSIS — Z79899 Other long term (current) drug therapy: Secondary | ICD-10-CM | POA: Insufficient documentation

## 2019-12-20 DIAGNOSIS — R519 Headache, unspecified: Secondary | ICD-10-CM | POA: Diagnosis not present

## 2019-12-20 DIAGNOSIS — I471 Supraventricular tachycardia: Secondary | ICD-10-CM | POA: Diagnosis not present

## 2019-12-20 HISTORY — PX: COLONOSCOPY WITH PROPOFOL: SHX5780

## 2019-12-20 SURGERY — COLONOSCOPY WITH PROPOFOL
Anesthesia: General

## 2019-12-20 MED ORDER — LIDOCAINE HCL (PF) 2 % IJ SOLN
INTRAMUSCULAR | Status: AC
  Filled 2019-12-20: qty 5

## 2019-12-20 MED ORDER — SODIUM CHLORIDE 0.9 % IV SOLN
INTRAVENOUS | Status: DC
  Administered 2019-12-20: 1000 mL via INTRAVENOUS

## 2019-12-20 MED ORDER — PROPOFOL 500 MG/50ML IV EMUL
INTRAVENOUS | Status: DC | PRN
  Administered 2019-12-20: 140 ug/kg/min via INTRAVENOUS

## 2019-12-20 MED ORDER — PROPOFOL 500 MG/50ML IV EMUL
INTRAVENOUS | Status: AC
  Filled 2019-12-20: qty 100

## 2019-12-20 MED ORDER — LIDOCAINE HCL (CARDIAC) PF 100 MG/5ML IV SOSY
PREFILLED_SYRINGE | INTRAVENOUS | Status: DC | PRN
  Administered 2019-12-20: 60 mg via INTRAVENOUS

## 2019-12-20 NOTE — Anesthesia Preprocedure Evaluation (Signed)
Anesthesia Evaluation  Patient identified by MRN, date of birth, ID band Patient awake    Reviewed: Allergy & Precautions, NPO status , Patient's Chart, lab work & pertinent test results  History of Anesthesia Complications Negative for: history of anesthetic complications  Airway Mallampati: II  TM Distance: >3 FB Neck ROM: Full    Dental no notable dental hx.    Pulmonary neg pulmonary ROS, neg sleep apnea, neg COPD,    breath sounds clear to auscultation- rhonchi (-) wheezing      Cardiovascular Exercise Tolerance: Good (-) hypertension(-) CAD, (-) Past MI, (-) Cardiac Stents and (-) CABG  Rhythm:Regular Rate:Normal - Systolic murmurs and - Diastolic murmurs    Neuro/Psych  Headaches, neg Seizures PSYCHIATRIC DISORDERS Depression    GI/Hepatic Neg liver ROS, GERD  ,  Endo/Other  negative endocrine ROSneg diabetes  Renal/GU negative Renal ROS     Musculoskeletal negative musculoskeletal ROS (+)   Abdominal (+) - obese,   Peds  Hematology negative hematology ROS (+)   Anesthesia Other Findings Past Medical History: No date: Depression     Comment:  no history of hospitalizations No date: Diverticulosis No date: Dysrhythmia No date: GERD (gastroesophageal reflux disease) No date: Headache disorder     Comment:  managed with imipramine 2011: History of colonic polyps     Comment:  last colonoscopy, [facility] No date: SVT (supraventricular tachycardia) (HCC)     Comment:  normal screening ER workup   Reproductive/Obstetrics                             Anesthesia Physical Anesthesia Plan  ASA: II  Anesthesia Plan: General   Post-op Pain Management:    Induction: Intravenous  PONV Risk Score and Plan: 2 and Propofol infusion  Airway Management Planned: Natural Airway  Additional Equipment:   Intra-op Plan:   Post-operative Plan:   Informed Consent: I have reviewed  the patients History and Physical, chart, labs and discussed the procedure including the risks, benefits and alternatives for the proposed anesthesia with the patient or authorized representative who has indicated his/her understanding and acceptance.     Dental advisory given  Plan Discussed with: CRNA and Anesthesiologist  Anesthesia Plan Comments:         Anesthesia Quick Evaluation

## 2019-12-20 NOTE — H&P (Signed)
Vonda Antigua, MD [address] [place], [place], [place], [address] [address], [place], [place], [place], [address] Phone: [phone]  Fax: [phone]  Primary Care Physician:  Crecencio Mc, MD   Pre-Procedure History & Physical: HPI:  Lauren Tanner is a 64 y.o. female is here for a colonoscopy.   Past Medical History:  Diagnosis Date  . Depression    no history of hospitalizations  . Diverticulosis   . Dysrhythmia   . GERD (gastroesophageal reflux disease)   . Headache disorder    managed with imipramine  . History of colonic polyps 2011   last colonoscopy, [facility]  . SVT (supraventricular tachycardia) (HCC)    normal screening ER workup    Past Surgical History:  Procedure Laterality Date  . COLONOSCOPY WITH PROPOFOL N/A 05/19/2015   Procedure: COLONOSCOPY WITH PROPOFOL;  Surgeon: Manya Silvas, MD;  Location: [facility] ENDOSCOPY;  Service: Endoscopy;  Laterality: N/A;  . DILATION AND CURETTAGE OF UTERUS      Prior to Admission medications   Medication Sig Start Date End Date Taking? Authorizing Provider  buPROPion (WELLBUTRIN XL) 300 MG 24 hr tablet Take 300 mg by mouth daily.   Yes [provider]  famotidine (PEPCID) 40 MG tablet Take 1 tablet (40 mg total) by mouth daily. 06/16/19  Yes Crecencio Mc, MD  imipramine (TOFRANIL) 50 MG tablet Take 50 mg by mouth 2 (two) times daily.   Yes [provider]  lamoTRIgine (LAMICTAL) 100 MG tablet Take 100 mg by mouth 3 (three) times daily.   Yes [provider]  AIMOVIG 70 MG/ML SOAJ SMARTSIG:70 Milligram(s) SUB-Q Once a Month 09/22/19   [provider]  clonazePAM (KLONOPIN) 0.5 MG tablet  05/29/17   [provider]  denosumab (PROLIA) 60 MG/ML SOLN injection Inject 60 mg into the skin once. Administer in upper arm, thigh, or abdomen 12/01/12   Crecencio Mc, MD  diazepam (VALIUM) 5 MG tablet Take 1 tablet (5 mg total) by mouth every 12 (twelve) hours as  needed for anxiety. Patient not taking: Reported on 11/24/2019 12/31/17   Crecencio Mc, MD  diltiazem (CARDIZEM) 60 MG tablet Take 1 tablet (60 mg total) by mouth 4 (four) times daily. Patient not taking: Reported on 11/24/2019 02/01/15   Crecencio Mc, MD  meloxicam (MOBIC) 7.5 MG tablet TAKE 1 TABLET BY MOUTH EVERY DAY AS NEEDED FOR PAIN Patient not taking: Reported on 11/24/2019 10/13/18   Jodelle Green, FNP  naratriptan (AMERGE) 2.5 MG tablet Take 2.5 mg by mouth daily as needed. 09/23/19   [provider]    Allergies as of 11/25/2019 - Review Complete 11/24/2019  Allergen Reaction Noted  . Ampicillin Hives 10/11/2011  . Septra [bactrim] Hives 10/11/2011  . Sulfa antibiotics Hives     Family History  Problem Relation Age of Onset  . Mental illness Mother   . Cancer Father 61       colon Ca,  bladder ca    Social History   Socioeconomic History  . Marital status: Married    Spouse name: Not on file  . Number of children: Not on file  . Years of education: Not on file  . Highest education level: Not on file  Occupational History  . Not on file  Tobacco Use  . Smoking status: Never Smoker  . Smokeless tobacco: Never Used  Substance and Sexual Activity  . Alcohol use: No  . Drug use: No  .  Sexual activity: Not on file  Other Topics Concern  . Not on file  Social History Narrative  . Not on file   Social Determinants of Health   Financial Resource Strain:   . Difficulty of Paying Living Expenses:   Food Insecurity:   . Worried About Programme researcher, broadcasting/film/video in the Last Year:   . Barista in the Last Year:   Transportation Needs:   . Freight forwarder (Medical):   Marland Kitchen Lack of Transportation (Non-Medical):   Physical Activity:   . Days of Exercise per Week:   . Minutes of Exercise per Session:   Stress:   . Feeling of Stress :   Social Connections:   . Frequency of Communication with Friends and Family:   . Frequency of Social Gatherings with  Friends and Family:   . Attends Religious Services:   . Active Member of Clubs or Organizations:   . Attends Banker Meetings:   Marland Kitchen Marital Status:   Intimate Partner Violence:   . Fear of Current or Ex-Partner:   . Emotionally Abused:   Marland Kitchen Physically Abused:   . Sexually Abused:     Review of Systems: See HPI, otherwise negative ROS  Physical Exam: BP 139/77   Pulse 88   Temp 97.9 F (36.6 C) (Tympanic)   Resp 18   Ht 5\' 5"  (1.651 m)   Wt 56.7 kg   SpO2 100%   BMI 20.80 kg/m  General:   Alert,  pleasant and cooperative in NAD Head:  Normocephalic and atraumatic. Neck:  Supple; no masses or thyromegaly. Lungs:  Clear throughout to auscultation, normal respiratory effort.    Heart:  +S1, +S2, Regular rate and rhythm, No edema. Abdomen:  Soft, nontender and nondistended. Normal bowel sounds, without guarding, and without rebound.   Neurologic:  Alert and  oriented x4;  grossly normal neurologically.  Impression/Plan: Lauren Tanner is here for a colonoscopy to be performed for highrisk screening, family history of colon cancer.  Risks, benefits, limitations, and alternatives regarding  colonoscopy have been reviewed with the patient.  Questions have been answered.  All parties agreeable.   Abby Potash, MD  12/20/2019, 8:12 AM

## 2019-12-20 NOTE — Anesthesia Postprocedure Evaluation (Signed)
Anesthesia Post Note  Patient: Lauren Tanner  Procedure(s) Performed: COLONOSCOPY WITH PROPOFOL (N/A )  Patient location during evaluation: Endoscopy Anesthesia Type: General Level of consciousness: awake and alert and oriented Pain management: pain level controlled Vital Signs Assessment: post-procedure vital signs reviewed and stable Respiratory status: spontaneous breathing, nonlabored ventilation and respiratory function stable Cardiovascular status: blood pressure returned to baseline and stable Postop Assessment: no signs of nausea or vomiting Anesthetic complications: no     Last Vitals:  Vitals:   12/20/19 0910 12/20/19 0920  BP: 112/78 117/79  Pulse: 69 65  Resp: 17 14  Temp:    SpO2: 100% 100%    Last Pain:  Vitals:   12/20/19 0850  TempSrc: Tympanic  PainSc:                  Eilleen Davoli

## 2019-12-20 NOTE — Op Note (Signed)
[facility] Gastroenterology Patient Name: Lauren Tanner Procedure Date: 12/20/2019 8:11 AM MRN: [id number] Account #: [id number] Date of Birth: October 19, 1955 Admit Type: Outpatient Age: 64 Room: [facility] ENDO ROOM 2 Gender: Female Note Status: Finalized Procedure:             Colonoscopy Indications:           Screening in patient at increased risk: Family history                         of 1st-degree relative with colorectal cancer Providers:             Junius Faucett B. Bonna Gains MD, MD Referring MD:          Deborra Medina, MD (Referring MD) Medicines:             Monitored Anesthesia Care Complications:         No immediate complications. Procedure:             Pre-Anesthesia Assessment:                        - Prior to the procedure, a History and Physical was                         performed, and patient medications, allergies and                         sensitivities were reviewed. The patient's tolerance                         of previous anesthesia was reviewed.                        - The risks and benefits of the procedure and the                         sedation options and risks were discussed with the                         patient. All questions were answered and informed                         consent was obtained.                        - Patient identification and proposed procedure were                         verified prior to the procedure by the physician, the                         nurse, the anesthetist and the technician. The                         procedure was verified in the pre-procedure area in                         the procedure room in the endoscopy suite.                        -  ASA Grade Assessment: II - A patient with mild                         systemic disease.                        - After reviewing the risks and benefits, the patient                         was deemed in satisfactory condition to undergo the        procedure.                        After obtaining informed consent, the colonoscope was                         passed under direct vision. Throughout the procedure,                         the patient's blood pressure, pulse, and oxygen                         saturations were monitored continuously. The                         Colonoscope was introduced through the anus and                         advanced to the the cecum, identified by appendiceal                         orifice and ileocecal valve. The colonoscopy was                         performed with ease. The patient tolerated the                         procedure well. The quality of the bowel preparation                         was poor. Findings:      The perianal and digital rectal examinations were normal.      A large amount of stool was found in the entire colon, interfering with       visualization.      The retroflexed view of the distal rectum and anal verge was normal and       showed no anal or rectal abnormalities. Impression:            - Preparation of the colon was poor.                        - Stool in the entire examined colon.                        - The distal rectum and anal verge are normal on                         retroflexion view.                        -  No specimens collected. Recommendation:        - Discharge patient to home.                        - Resume previous diet.                        - Continue present medications.                        - Repeat colonoscopy at the next available                         appointment, with 2 day prep, because the bowel                         preparation was poor.                        - Return to primary care physician as previously                         scheduled.                        - The findings and recommendations were discussed with                         the patient.                        - The findings and recommendations were  discussed with                         the patient's family. Procedure Code(s):     --- Professional ---                        [phone], Colonoscopy, flexible; diagnostic, including                         collection of specimen(s) by brushing or washing, when                         performed (separate procedure) Diagnosis Code(s):     --- Professional ---                        Z80.0, Family history of malignant neoplasm of                         digestive organs CPT copyright 2019 American Medical Association. All rights reserved. The codes documented in this report are preliminary and upon coder review may  be revised to meet current compliance requirements.  Melodie Bouillon, MD Michel Bickers B. Maximino Greenland MD, MD 12/20/2019 8:59:40 AM This report has been signed electronically. Number of Addenda: 0 Note Initiated On: 12/20/2019 8:11 AM Scope Withdrawal Time: 0 hours 10 minutes 41 seconds  Total Procedure Duration: 0 hours 30 minutes 6 seconds       [facility]

## 2019-12-20 NOTE — Transfer of Care (Signed)
Immediate Anesthesia Transfer of Care Note  Patient: Lauren Tanner  Procedure(s) Performed: COLONOSCOPY WITH PROPOFOL (N/A )  Patient Location: PACU  Anesthesia Type:General  Level of Consciousness: sedated  Airway & Oxygen Therapy: Patient Spontanous Breathing  Post-op Assessment: Report given to RN and Post -op Vital signs reviewed and stable  Post vital signs: Reviewed and stable  Last Vitals:  Vitals Value Taken Time  BP    Temp    Pulse    Resp    SpO2      Last Pain:  Vitals:   12/20/19 0718  TempSrc:   PainSc: 0-No pain         Complications: No apparent anesthesia complications

## 2019-12-21 ENCOUNTER — Encounter: Payer: Self-pay | Admitting: *Deleted

## 2019-12-21 ENCOUNTER — Telehealth: Payer: Self-pay

## 2019-12-21 NOTE — Telephone Encounter (Signed)
Per Dr. Maximino Greenland, she wanted the patient to have a repeat colonoscopy in 3-6 months with a 2 day extended prep due to poor bowel prep yesterday. Called patient and had to leave her a voicemail to return my call so I could reschedule it.

## 2019-12-22 NOTE — Telephone Encounter (Signed)
Called patient again and left her a voicemail to call me back.  I went ahead and placed her on a 3 month recall list to repeat her colonoscopy due to poor prepping. Patient is needing a 2 day prep per Dr. Maximino Greenland.

## 2019-12-28 ENCOUNTER — Other Ambulatory Visit: Payer: Self-pay

## 2019-12-28 MED ORDER — FAMOTIDINE 40 MG PO TABS: 40.0000 mg | ORAL_TABLET | Freq: Every day | ORAL | 1 refills | Status: DC

## 2019-12-29 ENCOUNTER — Other Ambulatory Visit: Payer: Self-pay

## 2019-12-29 ENCOUNTER — Telehealth: Payer: Self-pay

## 2019-12-29 DIAGNOSIS — Z800 Family history of malignant neoplasm of digestive organs: Secondary | ICD-10-CM

## 2019-12-29 DIAGNOSIS — Z1211 Encounter for screening for malignant neoplasm of colon: Secondary | ICD-10-CM

## 2019-12-29 NOTE — Telephone Encounter (Signed)
Patient called me back so we could schedule her colonoscopy in three months per Dr. Michele Mcalpine recommendation. Patient agreed on March 23, 2020 and COVID-19 testing on 03/21/2020. Patient was informed that she is needing to do a 2 day prepping per Dr. Maximino Greenland, which this will be provided to the patient by mail. Patient was informed that I would be sending her paperwork by mail and if she had any questions, to please call me back. Patient understood.

## 2019-12-31 DIAGNOSIS — M542 Cervicalgia: Secondary | ICD-10-CM | POA: Diagnosis not present

## 2019-12-31 DIAGNOSIS — G43719 Chronic migraine without aura, intractable, without status migrainosus: Secondary | ICD-10-CM | POA: Diagnosis not present

## 2020-02-03 ENCOUNTER — Other Ambulatory Visit: Payer: Self-pay

## 2020-02-03 DIAGNOSIS — Z800 Family history of malignant neoplasm of digestive organs: Secondary | ICD-10-CM

## 2020-02-03 DIAGNOSIS — Z1211 Encounter for screening for malignant neoplasm of colon: Secondary | ICD-10-CM

## 2020-02-08 ENCOUNTER — Telehealth: Payer: Self-pay

## 2020-02-08 DIAGNOSIS — Z1211 Encounter for screening for malignant neoplasm of colon: Secondary | ICD-10-CM

## 2020-02-08 DIAGNOSIS — Z800 Family history of malignant neoplasm of digestive organs: Secondary | ICD-10-CM

## 2020-02-08 NOTE — Telephone Encounter (Signed)
Patients colonoscopy has been rescheduled from 10/06 to 05/12/20 procedure will remain at [facility] with Dr. Maximino Greenland.  Referral updated.  Trish in Endo notified of date change.  New instructions will be provided via mychart.  Thanks,  [place], [place]

## 2020-02-09 DIAGNOSIS — F319 Bipolar disorder, unspecified: Secondary | ICD-10-CM | POA: Diagnosis not present

## 2020-02-15 DIAGNOSIS — M1812 Unilateral primary osteoarthritis of first carpometacarpal joint, left hand: Secondary | ICD-10-CM | POA: Diagnosis not present

## 2020-02-25 DIAGNOSIS — M542 Cervicalgia: Secondary | ICD-10-CM | POA: Diagnosis not present

## 2020-02-25 DIAGNOSIS — G43719 Chronic migraine without aura, intractable, without status migrainosus: Secondary | ICD-10-CM | POA: Diagnosis not present

## 2020-03-14 ENCOUNTER — Other Ambulatory Visit: Payer: Self-pay

## 2020-03-14 ENCOUNTER — Emergency Department: Payer: BC Managed Care – PPO

## 2020-03-14 ENCOUNTER — Emergency Department
Admission: EM | Admit: 2020-03-14 | Discharge: 2020-03-14 | Disposition: A | Discharge status: Home / Self Care | Payer: BC Managed Care – PPO | Attending: Emergency Medicine | Admitting: Emergency Medicine

## 2020-03-14 DIAGNOSIS — Z20822 Contact with and (suspected) exposure to covid-19: Secondary | ICD-10-CM | POA: Diagnosis not present

## 2020-03-14 DIAGNOSIS — J3489 Other specified disorders of nose and nasal sinuses: Secondary | ICD-10-CM | POA: Diagnosis not present

## 2020-03-14 DIAGNOSIS — R9431 Abnormal electrocardiogram [ECG] [EKG]: Secondary | ICD-10-CM | POA: Diagnosis not present

## 2020-03-14 DIAGNOSIS — G510 Bell's palsy: Secondary | ICD-10-CM

## 2020-03-14 DIAGNOSIS — I639 Cerebral infarction, unspecified: Secondary | ICD-10-CM | POA: Diagnosis not present

## 2020-03-14 DIAGNOSIS — I6782 Cerebral ischemia: Secondary | ICD-10-CM | POA: Diagnosis not present

## 2020-03-14 DIAGNOSIS — R29810 Facial weakness: Secondary | ICD-10-CM | POA: Diagnosis not present

## 2020-03-14 DIAGNOSIS — G319 Degenerative disease of nervous system, unspecified: Secondary | ICD-10-CM | POA: Diagnosis not present

## 2020-03-14 LAB — DIFFERENTIAL
Abs Immature Granulocytes: 0.02 10*3/uL (ref 0.00–0.07)
Basophils Absolute: 0 10*3/uL (ref 0.0–0.1)
Basophils Relative: 1 %
Eosinophils Absolute: 0.1 10*3/uL (ref 0.0–0.5)
Eosinophils Relative: 2 %
Immature Granulocytes: 0 %
Lymphocytes Relative: 38 %
Lymphs Abs: 2.4 10*3/uL (ref 0.7–4.0)
Monocytes Absolute: 0.5 10*3/uL (ref 0.1–1.0)
Monocytes Relative: 7 %
Neutro Abs: 3.3 10*3/uL (ref 1.7–7.7)
Neutrophils Relative %: 52 %

## 2020-03-14 LAB — CBC
HCT: 34.2 % — ABNORMAL LOW (ref 36.0–46.0)
Hemoglobin: 11.5 g/dL — ABNORMAL LOW (ref 12.0–15.0)
MCH: 32.4 pg (ref 26.0–34.0)
MCHC: 33.6 g/dL (ref 30.0–36.0)
MCV: 96.3 fL (ref 80.0–100.0)
Platelets: 251 10*3/uL (ref 150–400)
RBC: 3.55 MIL/uL — ABNORMAL LOW (ref 3.87–5.11)
RDW: 12.2 % (ref 11.5–15.5)
WBC: 6.3 10*3/uL (ref 4.0–10.5)
nRBC: 0 % (ref 0.0–0.2)

## 2020-03-14 LAB — COMPREHENSIVE METABOLIC PANEL
ALT: 24 U/L (ref 0–44)
AST: 23 U/L (ref 15–41)
Albumin: 4.2 g/dL (ref 3.5–5.0)
Alkaline Phosphatase: 55 U/L (ref 38–126)
Anion gap: 8 (ref 5–15)
BUN: 27 mg/dL — ABNORMAL HIGH (ref 8–23)
CO2: 26 mmol/L (ref 22–32)
Calcium: 8.9 mg/dL (ref 8.9–10.3)
Chloride: 102 mmol/L (ref 98–111)
Creatinine, Ser: 1.08 mg/dL — ABNORMAL HIGH (ref 0.44–1.00)
GFR calc Af Amer: >60 mL/min (ref >60)
GFR calc non Af Amer: 55 mL/min — ABNORMAL LOW (ref >60)
Glucose, Bld: 104 mg/dL — ABNORMAL HIGH (ref 70–99)
Potassium: 4.4 mmol/L (ref 3.5–5.1)
Sodium: 136 mmol/L (ref 135–145)
Total Bilirubin: 0.7 mg/dL (ref 0.3–1.2)
Total Protein: 7 g/dL (ref 6.5–8.1)

## 2020-03-14 LAB — PROTIME-INR
INR: 1 (ref 0.8–1.2)
Prothrombin Time: 12.7 seconds (ref 11.4–15.2)

## 2020-03-14 LAB — APTT: aPTT: 29 seconds (ref 24–36)

## 2020-03-14 LAB — GLUCOSE, CAPILLARY: Glucose-Capillary: 92 mg/dL (ref 70–99)

## 2020-03-14 LAB — SARS CORONAVIRUS 2 BY RT PCR (HOSPITAL ORDER, PERFORMED IN ~~LOC~~ HOSPITAL LAB): SARS Coronavirus 2: NEGATIVE

## 2020-03-14 MED ORDER — VALACYCLOVIR HCL 1 G PO TABS: 1000.0000 mg | ORAL_TABLET | Freq: Three times a day (TID) | ORAL | 0 refills | Status: DC

## 2020-03-14 MED ORDER — VALACYCLOVIR HCL 500 MG PO TABS
1000.0000 mg | ORAL_TABLET | Freq: Once | ORAL | Status: AC
  Administered 2020-03-14: 1000 mg via ORAL
  Filled 2020-03-14: qty 2

## 2020-03-14 MED ORDER — PREDNISONE 20 MG PO TABS: 60.0000 mg | ORAL_TABLET | Freq: Every day | ORAL | 0 refills | Status: DC

## 2020-03-14 MED ORDER — SODIUM CHLORIDE 0.9% FLUSH
3.0000 mL | Freq: Once | INTRAVENOUS | Status: AC
  Administered 2020-03-14: 3 mL via INTRAVENOUS

## 2020-03-14 MED ORDER — PREDNISONE 20 MG PO TABS
60.0000 mg | ORAL_TABLET | Freq: Once | ORAL | Status: AC
  Administered 2020-03-14: 60 mg via ORAL
  Filled 2020-03-14: qty 3

## 2020-03-14 NOTE — ED Notes (Signed)
Neurologist on screen at this time.  

## 2020-03-14 NOTE — ED Notes (Addendum)
Pt reports sitting in office at work when the left side of her face started feeling funny and her left eye wouldn't close properly, eye has been teary since yesterday. Reports hx of migraines and takes medication for this. Reports some pain behind left ear with pressure.  Left eye opens easily without resistance. Pt reports change in taste.

## 2020-03-14 NOTE — ED Notes (Signed)
Nurse present on neuro cart.

## 2020-03-14 NOTE — ED Notes (Signed)
Pt back from CT. Md paduchowski at bedside. Alert button pressed on neuro cart.

## 2020-03-14 NOTE — ED Notes (Signed)
Called code stroke to [phone]

## 2020-03-14 NOTE — ED Provider Notes (Signed)
[facility] Emergency Department Provider Note  Time seen: 6:09 PM  I have reviewed the triage vital signs and the nursing notes.   HISTORY  Chief Complaint Left facial droop  HPI Lauren Tanner is a 64 y.o. female with a past medical history of gastric reflux, SVT, migraines, presents to the emergency department for left-sided facial droop.  According to the patient at 2 PM today she noticed a droop to her left eyelid/eyebrow, now she has noted a droop to the entire left side of her face.  Denies any numbness to the left side of her face.  Patient denies any current headache.  Denies any weakness or numbness in any other part of the body.  No history of stroke.  Patient has received Covid vaccinations.   Past Medical History:  Diagnosis Date  . Depression    no history of hospitalizations  . Diverticulosis   . Dysrhythmia   . GERD (gastroesophageal reflux disease)   . Headache disorder    managed with imipramine  . History of colonic polyps 2011   last colonoscopy, [facility]  . SVT (supraventricular tachycardia) (HCC)    normal screening ER workup    Patient Active Problem List   Diagnosis Date Noted  . Hyperlipidemia LDL goal <160 10/24/2019  . Cervical cancer screening 10/24/2019  . Dystrophic nail 03/22/2017  . Cervical disc disorder with radiculopathy of cervical region 03/14/2016  . Tremor of both hands 01/22/2014  . Encounter for preventive health examination 11/17/2012  . Postmenopausal atrophic vaginitis 11/17/2012  . GERD (gastroesophageal reflux disease)   . Family history of colon cancer requiring screening colonoscopy   . Reflux esophagitis 10/11/2011  . Osteoporosis, post-menopausal 10/11/2011  . Uterine polyp 10/11/2011  . Chronic interstitial cystitis 10/11/2011  . Depression, major, in partial remission (HCC) 10/11/2011  . Migraine headache 10/11/2011  . SVT (supraventricular tachycardia) (HCC)   . Diverticulosis     Past  Surgical History:  Procedure Laterality Date  . COLONOSCOPY WITH PROPOFOL N/A 05/19/2015   Procedure: COLONOSCOPY WITH PROPOFOL;  Surgeon: Scot Jun, MD;  Location: [facility] ENDOSCOPY;  Service: Endoscopy;  Laterality: N/A;  . COLONOSCOPY WITH PROPOFOL N/A 12/20/2019   Procedure: COLONOSCOPY WITH PROPOFOL;  Surgeon: Pasty Spillers, MD;  Location: [facility] ENDOSCOPY;  Service: Endoscopy;  Laterality: N/A;  . DILATION AND CURETTAGE OF UTERUS      Prior to Admission medications   Medication Sig Start Date End Date Taking? Authorizing Provider  AIMOVIG 70 MG/ML SOAJ SMARTSIG:70 Milligram(s) SUB-Q Once a Month 09/22/19   [provider]  buPROPion (WELLBUTRIN XL) 300 MG 24 hr tablet Take 300 mg by mouth daily.    [provider]  clonazePAM Scarlette Calico) 0.5 MG tablet  05/29/17   [provider]  denosumab (PROLIA) 60 MG/ML SOLN injection Inject 60 mg into the skin once. Administer in upper arm, thigh, or abdomen 12/01/12   Sherlene Shams, MD  diazepam (VALIUM) 5 MG tablet Take 1 tablet (5 mg total) by mouth every 12 (twelve) hours as needed for anxiety. Patient not taking: Reported on 11/24/2019 12/31/17   Sherlene Shams, MD  diltiazem (CARDIZEM) 60 MG tablet Take 1 tablet (60 mg total) by mouth 4 (four) times daily. Patient not taking: Reported on 11/24/2019 02/01/15   Sherlene Shams, MD  famotidine (PEPCID) 40 MG tablet Take 1 tablet (40 mg total) by mouth daily. 12/28/19   Sherlene Shams, MD  imipramine (TOFRANIL) 50 MG tablet Take 50  mg by mouth 2 (two) times daily.    [provider]  lamoTRIgine (LAMICTAL) 100 MG tablet Take 100 mg by mouth 3 (three) times daily.    [provider]  meloxicam (MOBIC) 7.5 MG tablet TAKE 1 TABLET BY MOUTH EVERY DAY AS NEEDED FOR PAIN Patient not taking: Reported on 11/24/2019 10/13/18   Tracey Harries, FNP  naratriptan (AMERGE) 2.5 MG tablet Take 2.5 mg by mouth daily as needed. 09/23/19   [provider]     Allergies  Allergen Reactions  . Ampicillin Hives  . Septra [Bactrim] Hives  . Sulfa Antibiotics Hives    Family History  Problem Relation Age of Onset  . Mental illness Mother   . Cancer Father 75       colon Ca,  bladder ca    Social History Social History   Tobacco Use  . Smoking status: Never Smoker  . Smokeless tobacco: Never Used  Substance Use Topics  . Alcohol use: No  . Drug use: No    Review of Systems Constitutional: Negative for fever. ENT: No left ear pain or ringing. Cardiovascular: Negative for chest pain. Respiratory: Negative for shortness of breath. Gastrointestinal: Negative for abdominal pain, vomiting Musculoskeletal: Negative for musculoskeletal complaints Skin: Negative for skin complaints  Neurological: Left facial droop. All other ROS negative  ____________________________________________   PHYSICAL EXAM:  VITAL SIGNS: ED Triage Vitals  Enc Vitals Group     BP 03/14/20 1745 (!) 141/93     Pulse Rate 03/14/20 1745 90     Resp 03/14/20 1745 18     Temp 03/14/20 1745 98.6 F (37 C)     Temp Source 03/14/20 1745 Oral     SpO2 03/14/20 1745 99 %     Weight 03/14/20 1746 133 lb (60.3 kg)     Height 03/14/20 1746 5\' 5"  (1.651 m)     Head Circumference --      Peak Flow --      Pain Score 03/14/20 1746 0     Pain Loc --      Pain Edu? --      Excl. in GC? --     Constitutional: Alert and oriented. Well appearing and in no distress. Eyes: Normal exam ENT      Head: Normocephalic and atraumatic.      Mouth/Throat: Mucous membranes are moist. Cardiovascular: Normal rate, regular rhythm. Respiratory: Normal respiratory effort without tachypnea nor retractions. Breath sounds are clear Gastrointestinal: Soft and nontender. No distention.   Musculoskeletal: Nontender with normal range of motion in all extremities.  Neurologic:  Normal speech and language.  Obvious significant left facial droop involving the eyelid and eyebrow.   Patient states normal sensation.  Equal grip strength bilaterally 5/5 motor in all extremities.  No lower extremity drift, no upper extremity drift.  Normal sensation throughout all extremities. Skin:  Skin is warm, dry and intact.  Psychiatric: Mood and affect are normal.   ____________________________________________   RADIOLOGY  CT scan is negative for acute abnormality.  ____________________________________________   INITIAL IMPRESSION / ASSESSMENT AND PLAN / ED COURSE  Pertinent labs & imaging results that were available during my care of the patient were reviewed by me and considered in my medical decision making (see chart for details).   Patient presents to the emergency department with left facial droop noted at 2 PM.  Patient denies any numbness of the area, remainder of the neurological exam is normal.  Patient's physical exam is  very consistent with Bell's palsy.  However as the patient is within the TPA window we will consult neurology further expert opinion.  Neurology has seen the patient via teleneurology, they agree with likely Bell's palsy.  Recommend treatment with steroids and antivirals.  They do recommend checking for Covid.  The remainder the patient's labs and CT are normal.  EKG viewed and interpreted by myself shows a normal sinus rhythm at 75 bpm with a narrow QRS, normal axis, normal intervals, no concerning ST changes.  Sonny Poth was evaluated in Emergency Department on 03/14/2020 for the symptoms described in the history of present illness. She was evaluated in the context of the global COVID-19 pandemic, which necessitated consideration that the patient might be at risk for infection with the SARS-CoV-2 virus that causes COVID-19. Institutional protocols and algorithms that pertain to the evaluation of patients at risk for COVID-19 are in a state of rapid change based on information released by regulatory bodies including the CDC and federal and state  organizations. These policies and algorithms were followed during the patient's care in the ED.  ____________________________________________   FINAL CLINICAL IMPRESSION(S) / ED DIAGNOSES  Bell's palsy   Minna Antis, MD 03/14/20 [phone]

## 2020-03-14 NOTE — ED Notes (Signed)
Secretary notified code stroke. Enis Gash RN with pt

## 2020-03-14 NOTE — ED Triage Notes (Signed)
First nurse note- left facial numbness and left facial droop starting at 1400. Pulled for code stroke

## 2020-03-14 NOTE — Consult Note (Signed)
CODE STROKE- PHARMACY COMMUNICATION   Time CODE STROKE called/page received: 1737  Time response to CODE STROKE was made (in person or via phone): 1750  Time Stroke Kit retrieved from [place] (only if needed):N/A  Name of Provider/Nurse contacted: Joellen Jersey  Past Medical History:  Diagnosis Date  . Depression    no history of hospitalizations  . Diverticulosis   . Dysrhythmia   . GERD (gastroesophageal reflux disease)   . Headache disorder    managed with imipramine  . History of colonic polyps 2011   last colonoscopy, [facility]  . SVT (supraventricular tachycardia) (HCC)    normal screening ER workup   Prior to Admission medications   Medication Sig Start Date End Date Taking? Authorizing Provider  AIMOVIG 70 MG/ML SOAJ SMARTSIG:70 Milligram(s) SUB-Q Once a Month 09/22/19   [provider]  buPROPion (WELLBUTRIN XL) 300 MG 24 hr tablet Take 300 mg by mouth daily.    [provider]  clonazePAM Bobbye Charleston) 0.5 MG tablet  05/29/17   [provider]  denosumab (PROLIA) 60 MG/ML SOLN injection Inject 60 mg into the skin once. Administer in upper arm, thigh, or abdomen 12/01/12   Crecencio Mc, MD  diazepam (VALIUM) 5 MG tablet Take 1 tablet (5 mg total) by mouth every 12 (twelve) hours as needed for anxiety. Patient not taking: Reported on 11/24/2019 12/31/17   Crecencio Mc, MD  diltiazem (CARDIZEM) 60 MG tablet Take 1 tablet (60 mg total) by mouth 4 (four) times daily. Patient not taking: Reported on 11/24/2019 02/01/15   Crecencio Mc, MD  famotidine (PEPCID) 40 MG tablet Take 1 tablet (40 mg total) by mouth daily. 12/28/19   Crecencio Mc, MD  imipramine (TOFRANIL) 50 MG tablet Take 50 mg by mouth 2 (two) times daily.    [provider]  lamoTRIgine (LAMICTAL) 100 MG tablet Take 100 mg by mouth 3 (three) times daily.    [provider]  meloxicam (MOBIC) 7.5 MG tablet TAKE 1 TABLET BY MOUTH EVERY DAY AS NEEDED FOR PAIN Patient not  taking: Reported on 11/24/2019 10/13/18   Jodelle Green, FNP  naratriptan (AMERGE) 2.5 MG tablet Take 2.5 mg by mouth daily as needed. 09/23/19   [provider]    Lu Duffel ,PharmD Clinical Pharmacist  03/14/2020  5:52 PM

## 2020-03-14 NOTE — Consult Note (Signed)
[facility] TeleNeurology Consult Services   Date of Service:   03/14/2020 17:46:39  Impression:     .  G51.0 - Bell palsy  Comments/Sign-Out: Patient exam is suggestive of typical Bell's palsy. She has no vascular risk factors. She does report some taste change and symptoms have been progressively worsening over the past couple of days. I would suggest starting her on Valtrex and prednisone. I would recommend an eye patch. I would recommend outpatient primary care and neurology follow-up.  Metrics: Last Known Well: 03/14/2020 14:00:00 [facility] Notification Time: 03/14/2020 17:46:39 Arrival Time: 03/14/2020 17:28:00 Stamp Time: 03/14/2020 17:46:39 Time First Login Attempt: 03/14/2020 17:50:14 Symptoms: facial droop. NIHSS Start Assessment Time: 03/14/2020 17:53:54 Patient is not a candidate for Thrombolytic. Thrombolytic Medical Decision: 03/14/2020 17:56:09 Patient was not deemed candidate for Thrombolytic because of following reasons: Other Diagnosis suspected.  CT head showed no acute hemorrhage or acute core infarct.  ED Physician notified of diagnostic impression and management plan on 03/14/2020 18:06:22  Advanced Imaging: Advanced Imaging Not Recommended because:  Clinical Presentation is not Suggestive of LVO and NIHSS is <6   Sign Out:     .  Discussed with Emergency Department Provider    ------------------------------------------------------------------------------  History of Present Illness: Patient is a 64 year old Female.  Patient was brought by private transportation with symptoms of facial droop.  Extremely pleasant 64 year old female with past medical history of migraines came to the hospital because of left facial weakness. She reports the symptoms started this afternoon but she reports that she has noticed excessive watering of the tears from the left eye couple of days ago along with some taste change. This afternoon she  noticed more weakness of the face and came to ED. No focal weakness was reported. She denies any focal body weakness. Denies any gait or balance issues. Denies any headaches.    Past Medical History:     . There is NO history of Hypertension     . There is NO history of Diabetes Mellitus     . There is NO history of Hyperlipidemia     . There is NO history of Atrial Fibrillation     . There is NO history of Coronary Artery Disease     . There is NO history of Stroke     . Migraine  Social History: Smoking: No Alcohol Use: No Drug Use: No  Family History:cancer  Review of System:  14 Points Review of Systems was performed and was negative except mentioned in HPI.  Anticoagulant use:  No  Antiplatelet use: No     Examination: BP(141/93), Pulse(90), Blood Glucose(92) 1A: Level of Consciousness - Alert; keenly responsive + 0 1B: Ask Month and Age - Both Questions Right + 0 1C: Blink Eyes & Squeeze Hands - Performs Both Tasks + 0 2: Test Horizontal Extraocular Movements - Normal + 0 3: Test Visual Fields - No Visual Loss + 0 4: Test Facial Palsy (Use Grimace if Obtunded) - Partial paralysis (lower face) + 2 5A: Test Left Arm Motor Drift - No Drift for 10 Seconds + 0 5B: Test Right Arm Motor Drift - No Drift for 10 Seconds + 0 6A: Test Left Leg Motor Drift - No Drift for 5 Seconds + 0 6B: Test Right Leg Motor Drift - No Drift for 5 Seconds + 0 7: Test Limb Ataxia (FNF/Heel-Shin) - No Ataxia + 0 8: Test Sensation - Normal; No sensory loss + 0 9: Test Language/Aphasia - Normal; No  aphasia + 0 10: Test Dysarthria - Normal + 0 11: Test Extinction/Inattention - No abnormality + 0  NIHSS Score: 2  Pre-Morbid Modified Rankin Scale: 0 Points = No symptoms at all   Patient/Family was informed the Neurology Consult would occur via TeleHealth consult by way of interactive audio and video telecommunications and consented to receiving care in this manner.   Patient is being  evaluated for possible acute neurologic impairment and high probability of imminent or life-threatening deterioration. I spent total of 25 minutes providing care to this patient, including time for face to face visit via telemedicine, review of medical records, imaging studies and discussion of findings with providers, the patient and/or family.   Dr Hedy Camara   [facility] [phone]  Case [id number]

## 2020-03-15 ENCOUNTER — Telehealth: Payer: Self-pay

## 2020-03-15 NOTE — Telephone Encounter (Signed)
Pt went to ed yesterday.       [facility] [place] [facility] - [place] TELEPHONE ADVICE RECORD [facility] Patient Name: Lauren Tanner Gender: Female DOB: [birth year] Age: 64 Y 3 M 6 D Return Phone Number: [phone] (Primary) Address: City/State/Zip: [place] [address] Client [facility] - [place] Client Site [facility] [place] [facility] - Day Physician Duncan Dull - MD Contact Type Call Who Is Calling Patient / Member / Family / Caregiver Call Type Triage / Clinical Relationship To Patient Self Return Phone Number [phone] (Primary) Chief Complaint NUMBNESS/TINGLING- sudden on one side of the body or face Reason for Call Symptomatic / Request for Health Information Initial Comment Caller states she is having numbness on the left side of her face. Translation No Nurse Assessment Nurse: Lily Kocher, RN, Ronita Hipps Date/Time (Eastern Time): 03/14/2020 4:46:17 PM Confirm and document reason for call. If symptomatic, describe symptoms. ---Caller states she is having numbness on her left side of her face. No SOB and no chest pain. Has the patient had close contact with a person known or suspected to have the novel coronavirus illness OR traveled / lives in area with major community spread (including international travel) in the last 14 days from the onset of symptoms? * If Asymptomatic, screen for exposure and travel within the last 14 days. ---No Does the patient have any new or worsening symptoms? ---Yes Will a triage be completed? ---Yes Related visit to physician within the last 2 weeks? ---No Does the PT have any chronic conditions? (i.e. diabetes, asthma, this includes High risk factors for pregnancy, etc.) ---No Is this a behavioral health or substance abuse call? ---No Guidelines Guideline Title Affirmed Question Affirmed Notes Nurse Date/Time (Eastern Time) Neurologic Deficit [1] Numbness (i.e., loss of sensation) of  the face, arm / hand, or leg / foot on one side of the body AND [2] sudden onset AND [3] present now [name] 03/14/2020 4:47:20 PM PLEASE NOTE: All timestamps contained within this report are represented as [place] Standard Time. CONFIDENTIALTY NOTICE: This fax transmission is intended only for the addressee. It contains information that is legally privileged, confidential or otherwise protected from use or disclosure. If you are not the intended recipient, you are strictly prohibited from reviewing, disclosing, copying using or disseminating any of this information or taking any action in reliance on or regarding this information. If you have received this fax in error, please notify us immediately by telephone so that we can arrange for its return to [place]. Phone: [phone], Toll-Free: [phone], Fax: [phone] Page: 2 of 2 Call Id: [id number] Disp. Time Lamount Cohen Time) Disposition Final User 03/14/2020 4:40:12 PM Send to Urgent Queue Young Berry 03/14/2020 4:50:57 PM 911 Outcome Documentation Lily Kocher, RN, Ronita Hipps Reason: Jethro Bolus will speak to spouse and then make a decision if she will call 911. 03/14/2020 4:50:02 PM Call EMS 911 Now Yes Lily Kocher, RN, Bunnie Pion Disagree/Comply Disagree Caller Understands Yes PreDisposition Did not know what to do Care Advice Given Per Guideline CALL EMS 911 NOW: * Immediate medical attention is needed. You need to hang up and call 911 (or an ambulance). * Triager Discretion: I'll call you back in a few minutes to be sure you were able to reach them. CARE ADVICE given per Neurologic Deficit (Adult) guideline. Comments User: Felipe Drone, RN Date/Time Lamount Cohen Time): 03/14/2020 4:49:53 PM Caller said she would talk to her husband bout what to do. Referrals GO TO FACILITY UNDECIDED

## 2020-03-22 ENCOUNTER — Other Ambulatory Visit: Payer: Self-pay | Admitting: Internal Medicine

## 2020-03-22 DIAGNOSIS — G510 Bell's palsy: Secondary | ICD-10-CM

## 2020-03-22 MED ORDER — PREDNISONE 10 MG PO TABS: ORAL_TABLET | ORAL | 0 refills | Status: DC

## 2020-03-22 MED ORDER — VALACYCLOVIR HCL 1 G PO TABS: 1000.0000 mg | ORAL_TABLET | Freq: Three times a day (TID) | ORAL | 0 refills | Status: DC

## 2020-03-23 ENCOUNTER — Other Ambulatory Visit: Payer: Self-pay

## 2020-03-23 MED ORDER — VALACYCLOVIR HCL 1 G PO TABS: 1000.0000 mg | ORAL_TABLET | Freq: Three times a day (TID) | ORAL | 0 refills | Status: DC

## 2020-04-04 DIAGNOSIS — H168 Other keratitis: Secondary | ICD-10-CM | POA: Diagnosis not present

## 2020-04-06 MED ORDER — FAMOTIDINE 40 MG PO TABS: 40.0000 mg | ORAL_TABLET | Freq: Every day | ORAL | 3 refills | Status: DC

## 2020-04-21 DIAGNOSIS — M1711 Unilateral primary osteoarthritis, right knee: Secondary | ICD-10-CM | POA: Diagnosis not present

## 2020-05-02 ENCOUNTER — Telehealth: Payer: Self-pay

## 2020-05-02 ENCOUNTER — Telehealth: Payer: Self-pay | Admitting: Gastroenterology

## 2020-05-02 NOTE — Telephone Encounter (Signed)
Patient called needing to reschedule her procedure. Had procedure switched to 11/24. Pt verbalized understanding.

## 2020-05-02 NOTE — Telephone Encounter (Signed)
Patient calling to resch procedure with Dr. Maximino Greenland on 10.15.2021. Pt had pre visit with the nurse on 4.28.21. Please call pt to resch

## 2020-05-15 DIAGNOSIS — S0501XA Injury of conjunctiva and corneal abrasion without foreign body, right eye, initial encounter: Secondary | ICD-10-CM | POA: Diagnosis not present

## 2020-05-15 DIAGNOSIS — M542 Cervicalgia: Secondary | ICD-10-CM | POA: Diagnosis not present

## 2020-05-15 DIAGNOSIS — G43719 Chronic migraine without aura, intractable, without status migrainosus: Secondary | ICD-10-CM | POA: Diagnosis not present

## 2020-05-16 DIAGNOSIS — S0501XA Injury of conjunctiva and corneal abrasion without foreign body, right eye, initial encounter: Secondary | ICD-10-CM | POA: Diagnosis not present

## 2020-05-18 DIAGNOSIS — S0501XD Injury of conjunctiva and corneal abrasion without foreign body, right eye, subsequent encounter: Secondary | ICD-10-CM | POA: Diagnosis not present

## 2020-05-19 DIAGNOSIS — S0501XD Injury of conjunctiva and corneal abrasion without foreign body, right eye, subsequent encounter: Secondary | ICD-10-CM | POA: Diagnosis not present

## 2020-06-05 ENCOUNTER — Telehealth: Payer: Self-pay

## 2020-06-05 NOTE — Telephone Encounter (Signed)
LVM for pt to call office today in regards to rescheduling her 06/21/20 colonoscopy with Dr. Maximino Greenland.  This is a screening colonoscopy.  Dr. Maximino Greenland is on PAL.  Thanks,  [place], [place]

## 2020-06-06 ENCOUNTER — Other Ambulatory Visit: Payer: Self-pay

## 2020-06-06 DIAGNOSIS — Z800 Family history of malignant neoplasm of digestive organs: Secondary | ICD-10-CM

## 2020-06-06 DIAGNOSIS — Z1211 Encounter for screening for malignant neoplasm of colon: Secondary | ICD-10-CM

## 2020-07-07 ENCOUNTER — Other Ambulatory Visit: Payer: Self-pay

## 2020-07-07 ENCOUNTER — Other Ambulatory Visit
Admission: RE | Admit: 2020-07-07 | Discharge: 2020-07-07 | Disposition: A | Discharge status: Home / Self Care | Payer: BC Managed Care – PPO | Source: Ambulatory Visit | Attending: Gastroenterology | Admitting: Gastroenterology

## 2020-07-07 DIAGNOSIS — Z01812 Encounter for preprocedural laboratory examination: Secondary | ICD-10-CM | POA: Insufficient documentation

## 2020-07-07 DIAGNOSIS — Z20822 Contact with and (suspected) exposure to covid-19: Secondary | ICD-10-CM | POA: Insufficient documentation

## 2020-07-08 LAB — SARS CORONAVIRUS 2 (TAT 6-24 HRS): SARS Coronavirus 2: NEGATIVE

## 2020-07-10 ENCOUNTER — Encounter: Payer: Self-pay | Admitting: Gastroenterology

## 2020-07-11 ENCOUNTER — Ambulatory Visit
Admission: RE | Admit: 2020-07-11 | Discharge: 2020-07-11 | Disposition: A | Discharge status: Home / Self Care | Payer: BC Managed Care – PPO | Attending: Gastroenterology | Admitting: Gastroenterology

## 2020-07-11 ENCOUNTER — Encounter: Payer: Self-pay | Admitting: Gastroenterology

## 2020-07-11 ENCOUNTER — Ambulatory Visit: Payer: BC Managed Care – PPO | Admitting: Anesthesiology

## 2020-07-11 ENCOUNTER — Other Ambulatory Visit: Payer: Self-pay

## 2020-07-11 ENCOUNTER — Encounter
Admission: RE | Disposition: A | Discharge status: Home / Self Care | Payer: Self-pay | Source: Home / Self Care | Attending: Gastroenterology

## 2020-07-11 DIAGNOSIS — Z79899 Other long term (current) drug therapy: Secondary | ICD-10-CM | POA: Insufficient documentation

## 2020-07-11 DIAGNOSIS — Z800 Family history of malignant neoplasm of digestive organs: Secondary | ICD-10-CM | POA: Diagnosis not present

## 2020-07-11 DIAGNOSIS — Z881 Allergy status to other antibiotic agents status: Secondary | ICD-10-CM | POA: Diagnosis not present

## 2020-07-11 DIAGNOSIS — Z1211 Encounter for screening for malignant neoplasm of colon: Secondary | ICD-10-CM | POA: Insufficient documentation

## 2020-07-11 DIAGNOSIS — Z882 Allergy status to sulfonamides status: Secondary | ICD-10-CM | POA: Diagnosis not present

## 2020-07-11 HISTORY — PX: COLONOSCOPY WITH PROPOFOL: SHX5780

## 2020-07-11 SURGERY — COLONOSCOPY WITH PROPOFOL
Anesthesia: General

## 2020-07-11 MED ORDER — LIDOCAINE HCL (CARDIAC) PF 100 MG/5ML IV SOSY
PREFILLED_SYRINGE | INTRAVENOUS | Status: DC | PRN
  Administered 2020-07-11: 40 mg via INTRAVENOUS

## 2020-07-11 MED ORDER — LIDOCAINE HCL (PF) 2 % IJ SOLN
INTRAMUSCULAR | Status: AC
  Filled 2020-07-11: qty 5

## 2020-07-11 MED ORDER — PROPOFOL 10 MG/ML IV BOLUS
INTRAVENOUS | Status: DC | PRN
  Administered 2020-07-11: 80 mg via INTRAVENOUS

## 2020-07-11 MED ORDER — PROPOFOL 500 MG/50ML IV EMUL
INTRAVENOUS | Status: AC
  Filled 2020-07-11: qty 50

## 2020-07-11 MED ORDER — SODIUM CHLORIDE 0.9 % IV SOLN: INTRAVENOUS | Status: DC

## 2020-07-11 MED ORDER — PROPOFOL 500 MG/50ML IV EMUL
INTRAVENOUS | Status: DC | PRN
  Administered 2020-07-11: 150 ug/kg/min via INTRAVENOUS

## 2020-07-11 NOTE — H&P (Signed)
Wyline Mood, MD [address], [place], [place], [address] [address], [address], [place], [place], [address] Phone: [phone]  Fax: [phone]  Primary Care Physician:  Sherlene Shams, MD   Pre-Procedure History & Physical: HPI:  Lauren Tanner is a 64 y.o. female is here for an colonoscopy.   Past Medical History:  Diagnosis Date  . Depression    no history of hospitalizations  . Diverticulosis   . Dysrhythmia   . GERD (gastroesophageal reflux disease)   . Headache disorder    managed with imipramine  . History of colonic polyps 2011   last colonoscopy, [facility]  . SVT (supraventricular tachycardia) (HCC)    normal screening ER workup    Past Surgical History:  Procedure Laterality Date  . COLONOSCOPY WITH PROPOFOL N/A 05/19/2015   Procedure: COLONOSCOPY WITH PROPOFOL;  Surgeon: Scot Jun, MD;  Location: [facility] ENDOSCOPY;  Service: Endoscopy;  Laterality: N/A;  . COLONOSCOPY WITH PROPOFOL N/A 12/20/2019   Procedure: COLONOSCOPY WITH PROPOFOL;  Surgeon: Pasty Spillers, MD;  Location: [facility] ENDOSCOPY;  Service: Endoscopy;  Laterality: N/A;  . DILATION AND CURETTAGE OF UTERUS      Prior to Admission medications   Medication Sig Start Date End Date Taking? Authorizing Provider  AIMOVIG 70 MG/ML SOAJ SMARTSIG:70 Milligram(s) SUB-Q Once a Month 09/22/19  Yes [provider]  buPROPion (WELLBUTRIN XL) 300 MG 24 hr tablet Take 300 mg by mouth daily.   Yes [provider]  clonazePAM (KLONOPIN) 0.5 MG tablet  05/29/17  Yes [provider]  denosumab (PROLIA) 60 MG/ML SOLN injection Inject 60 mg into the skin once. Administer in upper arm, thigh, or abdomen 12/01/12  Yes Sherlene Shams, MD  famotidine (PEPCID) 40 MG tablet Take 1 tablet (40 mg total) by mouth daily. 04/06/20  Yes Sherlene Shams, MD  imipramine (TOFRANIL) 50 MG tablet Take 50 mg by mouth 2 (two) times daily.   Yes [provider]  lamoTRIgine  (LAMICTAL) 100 MG tablet Take 100 mg by mouth 3 (three) times daily.   Yes [provider]  naratriptan (AMERGE) 2.5 MG tablet Take 2.5 mg by mouth daily as needed. 09/23/19  Yes [provider]  diazepam (VALIUM) 5 MG tablet Take 1 tablet (5 mg total) by mouth every 12 (twelve) hours as needed for anxiety. Patient not taking: No sig reported 12/31/17   Sherlene Shams, MD  diltiazem (CARDIZEM) 60 MG tablet Take 1 tablet (60 mg total) by mouth 4 (four) times daily. Patient not taking: Reported on 11/24/2019 02/01/15   Sherlene Shams, MD  meloxicam (MOBIC) 7.5 MG tablet TAKE 1 TABLET BY MOUTH EVERY DAY AS NEEDED FOR PAIN Patient not taking: No sig reported 10/13/18   Tracey Harries, FNP  predniSONE (DELTASONE) 10 MG tablet 6 tablets daily for 3 days, then reduce by 1 tablet daily until gone Patient not taking: Reported on 07/11/2020 03/22/20   Sherlene Shams, MD  valACYclovir (VALTREX) 1000 MG tablet Take 1 tablet (1,000 mg total) by mouth 3 (three) times daily. Patient not taking: Reported on 07/11/2020 03/23/20   Sherlene Shams, MD    Allergies as of 12/30/2019 - Review Complete 12/20/2019  Allergen Reaction Noted  . Ampicillin Hives 10/11/2011  . Septra [bactrim] Hives 10/11/2011  . Sulfa antibiotics Hives     Family History  Problem Relation Age of Onset  . Mental illness Mother   . Cancer Father 60  colon Ca,  bladder ca    Social History   Socioeconomic History  . Marital status: Married    Spouse name: Not on file  . Number of children: Not on file  . Years of education: Not on file  . Highest education level: Not on file  Occupational History  . Not on file  Tobacco Use  . Smoking status: Never Smoker  . Smokeless tobacco: Never Used  Vaping Use  . Vaping Use: Never used  Substance and Sexual Activity  . Alcohol use: No  . Drug use: No  . Sexual activity: Not on file  Other Topics Concern  . Not on file  Social History Narrative  . Not on file    Social Determinants of Health   Financial Resource Strain: Not on file  Food Insecurity: Not on file  Transportation Needs: Not on file  Physical Activity: Not on file  Stress: Not on file  Social Connections: Not on file  Intimate Partner Violence: Not on file    Review of Systems: See HPI, otherwise negative ROS  Physical Exam: BP 106/83   Pulse 61   Temp (!) 96.8 F (36 C) (Temporal)   Resp 16   Ht 5\' 5"  (1.651 m)   Wt 55.8 kg   SpO2 96%   BMI 20.47 kg/m  General:   Alert,  pleasant and cooperative in NAD Head:  Normocephalic and atraumatic. Neck:  Supple; no masses or thyromegaly. Lungs:  Clear throughout to auscultation, normal respiratory effort.    Heart:  +S1, +S2, Regular rate and rhythm, No edema. Abdomen:  Soft, nontender and nondistended. Normal bowel sounds, without guarding, and without rebound.   Neurologic:  Alert and  oriented x4;  grossly normal neurologically.  Impression/Plan: Lauren Tanner is here for an colonoscopy to be performed for Screening colonoscopy father had colon cancer.   Risks, benefits, limitations, and alternatives regarding  colonoscopy have been reviewed with the patient.  Questions have been answered.  All parties agreeable.   Abby Potash, MD  07/11/2020, 8:05 AM

## 2020-07-11 NOTE — Anesthesia Procedure Notes (Signed)
Date/Time: 07/11/2020 8:08 AM Performed by: Stormy Fabian, CRNA Pre-anesthesia Checklist: Patient identified, Emergency Drugs available, Suction available and Patient being monitored Patient Re-evaluated:Patient Re-evaluated prior to induction Oxygen Delivery Method: Nasal cannula Induction Type: IV induction Dental Injury: Teeth and Oropharynx as per pre-operative assessment  Comments: Nasal cannula with etCO2 monitoring

## 2020-07-11 NOTE — Op Note (Signed)
[facility] Gastroenterology Patient Name: Lauren Tanner Procedure Date: 07/11/2020 7:59 AM MRN: [id number] Account #: [id number] Date of Birth: 03-09-1956 Admit Type: Outpatient Age: 64 Room: [facility] ENDO ROOM 2 Gender: Female Note Status: Finalized Procedure:             Colonoscopy Indications:           Screening patient at increased risk: Family history of                         1st-degree relative with colorectal cancer at age 80                         years (or older) Providers:             Wyline Mood MD, MD Referring MD:          Duncan Dull, MD (Referring MD) Medicines:             Monitored Anesthesia Care Complications:         No immediate complications. Procedure:             Pre-Anesthesia Assessment:                        - Prior to the procedure, a History and Physical was                         performed, and patient medications, allergies and                         sensitivities were reviewed. The patient's tolerance                         of previous anesthesia was reviewed.                        - The risks and benefits of the procedure and the                         sedation options and risks were discussed with the                         patient. All questions were answered and informed                         consent was obtained.                        - ASA Grade Assessment: II - A patient with mild                         systemic disease.                        After obtaining informed consent, the colonoscope was                         passed under direct vision. Throughout the procedure,                         the patient's blood pressure, pulse,  and oxygen                         saturations were monitored continuously. The                         Colonoscope was introduced through the anus and                         advanced to the the cecum, identified by the                         appendiceal orifice. The colonoscopy  was performed                         with ease. The patient tolerated the procedure well.                         The quality of the bowel preparation was excellent. Findings:      The perianal and digital rectal examinations were normal.      The entire examined colon appeared normal on direct and retroflexion       views. Impression:            - The entire examined colon is normal on direct and                         retroflexion views.                        - No specimens collected. Recommendation:        - Discharge patient to home (with escort).                        - Advance diet as tolerated.                        - Continue present medications.                        - Repeat colonoscopy in 5 years for screening purposes. Procedure Code(s):     --- Professional ---                        [phone], Colonoscopy, flexible; diagnostic, including                         collection of specimen(s) by brushing or washing, when                         performed (separate procedure) Diagnosis Code(s):     --- Professional ---                        Z80.0, Family history of malignant neoplasm of                         digestive organs CPT copyright 2019 American Medical Association. All rights reserved. The codes documented in this report are preliminary and upon coder review may  be revised to meet current compliance requirements. Wyline Mood, MD Wyline Mood MD, MD  07/11/2020 8:30:44 AM This report has been signed electronically. Number of Addenda: 0 Note Initiated On: 07/11/2020 7:59 AM Scope Withdrawal Time: 0 hours 11 minutes 58 seconds  Total Procedure Duration: 0 hours 19 minutes 10 seconds  Estimated Blood Loss:  Estimated blood loss: none.      [facility]

## 2020-07-11 NOTE — Progress Notes (Signed)
   07/11/20 0800  Clinical Encounter Type  Visited With Family  Visit Type Initial  Referral From Chaplain  Consult/Referral To Chaplain  Chaplain briefly visited with Pt's wife and he said all was well. He did not have any questions or concerns.

## 2020-07-11 NOTE — Anesthesia Preprocedure Evaluation (Signed)
Anesthesia Evaluation  Patient identified by MRN, date of birth, ID band Patient awake    Reviewed: Allergy & Precautions, NPO status , Patient's Chart, lab work & pertinent test results  History of Anesthesia Complications Negative for: history of anesthetic complications  Airway Mallampati: II  TM Distance: >3 FB Neck ROM: Full    Dental no notable dental hx.    Pulmonary neg pulmonary ROS, neg sleep apnea, neg COPD,    breath sounds clear to auscultation- rhonchi (-) wheezing      Cardiovascular Exercise Tolerance: Good (-) hypertension(-) CAD, (-) Past MI, (-) Cardiac Stents and (-) CABG  Rhythm:Regular Rate:Normal - Systolic murmurs and - Diastolic murmurs    Neuro/Psych  Headaches, neg Seizures PSYCHIATRIC DISORDERS Depression    GI/Hepatic Neg liver ROS, GERD  ,  Endo/Other  negative endocrine ROSneg diabetes  Renal/GU negative Renal ROS     Musculoskeletal negative musculoskeletal ROS (+)   Abdominal (+) - obese,   Peds  Hematology negative hematology ROS (+)   Anesthesia Other Findings Past Medical History: No date: Depression     Comment:  no history of hospitalizations No date: Diverticulosis No date: Dysrhythmia No date: GERD (gastroesophageal reflux disease) No date: Headache disorder     Comment:  managed with imipramine 2011: History of colonic polyps     Comment:  last colonoscopy, [facility] No date: SVT (supraventricular tachycardia) (HCC)     Comment:  normal screening ER workup   Reproductive/Obstetrics                             Anesthesia Physical  Anesthesia Plan  ASA: II  Anesthesia Plan: General   Post-op Pain Management:    Induction: Intravenous  PONV Risk Score and Plan: 2 and Propofol infusion  Airway Management Planned: Natural Airway  Additional Equipment:   Intra-op Plan:   Post-operative Plan:   Informed Consent: I have  reviewed the patients History and Physical, chart, labs and discussed the procedure including the risks, benefits and alternatives for the proposed anesthesia with the patient or authorized representative who has indicated his/her understanding and acceptance.     Dental advisory given  Plan Discussed with: CRNA and Anesthesiologist  Anesthesia Plan Comments: (Patient consented for risks of anesthesia including but not limited to:  - adverse reactions to medications - risk of airway placement if required - damage to eyes, teeth, lips or other oral mucosa - nerve damage due to positioning  - sore throat or hoarseness - Damage to heart, brain, nerves, lungs, other parts of body or loss of life  Patient voiced understanding.)        Anesthesia Quick Evaluation

## 2020-07-11 NOTE — Transfer of Care (Signed)
Immediate Anesthesia Transfer of Care Note  Patient: Lauren Tanner  Procedure(s) Performed: Procedure(s): COLONOSCOPY WITH PROPOFOL (N/A)  Patient Location: PACU and Endoscopy Unit  Anesthesia Type:General  Level of Consciousness: sedated  Airway & Oxygen Therapy: Patient Spontanous Breathing and Patient connected to nasal cannula oxygen  Post-op Assessment: Report given to RN and Post -op Vital signs reviewed and stable  Post vital signs: Reviewed and stable  Last Vitals:  Vitals:   07/11/20 0721 07/11/20 0833  BP: 106/83 115/80  Pulse: 61 80  Resp: 16 11  Temp: (!) 36 C 36.5 C  SpO2: 96% 100%    Complications: No apparent anesthesia complications

## 2020-07-11 NOTE — Anesthesia Postprocedure Evaluation (Signed)
Anesthesia Post Note  Patient: Lauren Tanner  Procedure(s) Performed: COLONOSCOPY WITH PROPOFOL (N/A )  Patient location during evaluation: Endoscopy Anesthesia Type: General Level of consciousness: awake and alert Pain management: pain level controlled Vital Signs Assessment: post-procedure vital signs reviewed and stable Respiratory status: spontaneous breathing, nonlabored ventilation, respiratory function stable and patient connected to nasal cannula oxygen Cardiovascular status: blood pressure returned to baseline and stable Postop Assessment: no apparent nausea or vomiting Anesthetic complications: no   No complications documented.   Last Vitals:  Vitals:   07/11/20 0833 07/11/20 0843  BP: 115/80   Pulse: 80   Resp: 11   Temp: 36.5 C   SpO2: 100% 100%    Last Pain:  Vitals:   07/11/20 0903  TempSrc:   PainSc: 0-No pain                 Cleda Mccreedy Lecretia Buczek

## 2020-07-12 ENCOUNTER — Encounter: Payer: Self-pay | Admitting: Gastroenterology

## 2020-07-18 ENCOUNTER — Other Ambulatory Visit: Payer: Self-pay

## 2020-07-18 ENCOUNTER — Ambulatory Visit (INDEPENDENT_AMBULATORY_CARE_PROVIDER_SITE_OTHER): Payer: BC Managed Care – PPO

## 2020-07-18 DIAGNOSIS — M810 Age-related osteoporosis without current pathological fracture: Secondary | ICD-10-CM | POA: Diagnosis not present

## 2020-07-18 MED ORDER — DENOSUMAB 60 MG/ML ~~LOC~~ SOSY
60.0000 mg | PREFILLED_SYRINGE | Freq: Once | SUBCUTANEOUS | Status: AC
Start: 2020-07-18 — End: 2020-07-18
  Administered 2020-07-18: 60 mg via SUBCUTANEOUS

## 2020-07-18 NOTE — Progress Notes (Signed)
Patient presented for 6-month Prolia injection SQ to right arm. Patient tolerated well. 

## 2020-08-21 DIAGNOSIS — M542 Cervicalgia: Secondary | ICD-10-CM | POA: Diagnosis not present

## 2020-08-21 DIAGNOSIS — G43719 Chronic migraine without aura, intractable, without status migrainosus: Secondary | ICD-10-CM | POA: Diagnosis not present

## 2020-09-07 DIAGNOSIS — Z1231 Encounter for screening mammogram for malignant neoplasm of breast: Secondary | ICD-10-CM

## 2020-09-19 DIAGNOSIS — F331 Major depressive disorder, recurrent, moderate: Secondary | ICD-10-CM | POA: Diagnosis not present

## 2020-09-19 DIAGNOSIS — F411 Generalized anxiety disorder: Secondary | ICD-10-CM | POA: Diagnosis not present

## 2020-09-19 DIAGNOSIS — F5105 Insomnia due to other mental disorder: Secondary | ICD-10-CM | POA: Diagnosis not present

## 2020-09-28 ENCOUNTER — Other Ambulatory Visit: Payer: Self-pay

## 2020-09-28 ENCOUNTER — Ambulatory Visit
Admission: RE | Admit: 2020-09-28 | Discharge: 2020-09-28 | Disposition: A | Discharge status: Home / Self Care | Payer: BC Managed Care – PPO | Source: Ambulatory Visit | Attending: Internal Medicine | Admitting: Internal Medicine

## 2020-09-28 DIAGNOSIS — Z1231 Encounter for screening mammogram for malignant neoplasm of breast: Secondary | ICD-10-CM | POA: Diagnosis not present

## 2020-10-10 DIAGNOSIS — G43719 Chronic migraine without aura, intractable, without status migrainosus: Secondary | ICD-10-CM | POA: Diagnosis not present

## 2020-10-10 DIAGNOSIS — M542 Cervicalgia: Secondary | ICD-10-CM | POA: Diagnosis not present

## 2020-11-21 ENCOUNTER — Encounter: Payer: Self-pay | Admitting: Internal Medicine

## 2020-11-21 ENCOUNTER — Other Ambulatory Visit: Payer: Self-pay

## 2020-11-21 ENCOUNTER — Other Ambulatory Visit (HOSPITAL_COMMUNITY)
Admission: RE | Admit: 2020-11-21 | Discharge: 2020-11-21 | Disposition: A | Discharge status: Home / Self Care | Payer: BC Managed Care – PPO | Source: Ambulatory Visit | Attending: Internal Medicine | Admitting: Internal Medicine

## 2020-11-21 ENCOUNTER — Ambulatory Visit (INDEPENDENT_AMBULATORY_CARE_PROVIDER_SITE_OTHER): Payer: BC Managed Care – PPO | Admitting: Internal Medicine

## 2020-11-21 VITALS — BP 112/64 | HR 91 | Temp 97.3°F | Resp 16 | Ht 65.0 in | Wt 130.4 lb

## 2020-11-21 DIAGNOSIS — F3341 Major depressive disorder, recurrent, in partial remission: Secondary | ICD-10-CM

## 2020-11-21 DIAGNOSIS — R944 Abnormal results of kidney function studies: Secondary | ICD-10-CM

## 2020-11-21 DIAGNOSIS — Z0000 Encounter for general adult medical examination without abnormal findings: Secondary | ICD-10-CM

## 2020-11-21 DIAGNOSIS — M21612 Bunion of left foot: Secondary | ICD-10-CM

## 2020-11-21 DIAGNOSIS — M21611 Bunion of right foot: Secondary | ICD-10-CM

## 2020-11-21 DIAGNOSIS — Z124 Encounter for screening for malignant neoplasm of cervix: Secondary | ICD-10-CM

## 2020-11-21 DIAGNOSIS — Z800 Family history of malignant neoplasm of digestive organs: Secondary | ICD-10-CM

## 2020-11-21 DIAGNOSIS — E785 Hyperlipidemia, unspecified: Secondary | ICD-10-CM | POA: Diagnosis not present

## 2020-11-21 MED ORDER — TETANUS-DIPHTH-ACELL PERTUSSIS 5-2.5-18.5 LF-MCG/0.5 IM SUSY: 0.5000 mL | PREFILLED_SYRINGE | Freq: Once | INTRAMUSCULAR | 0 refills | Status: AC

## 2020-11-21 NOTE — Progress Notes (Signed)
Patient ID: Lauren Tanner, female    DOB: August 08, 1955  Age: 65 y.o. MRN: [id number]  The patient is here for annual PREVENTIVE  examination and management of other chronic and acute problems.   The risk factors are reflected in the social history.  The roster of all physicians providing medical care to patient - is listed in the Snapshot section of the chart.  Activities of daily living:  The patient is 100% independent in all ADLs: dressing, toileting, feeding as well as independent mobility  Home safety : The patient has smoke detectors in the home. They wear seatbelts.  There are no firearms at home. There is no violence in the home.   There is no risks for hepatitis, STDs or HIV. There is no   history of blood transfusion. They have no travel history to infectious disease endemic areas of the world.  The patient has seen their dentist in the last six month. They have seen their eye doctor in the last year. She has no hearing difficulty .  She does not  have excessive sun exposure. Discussed the need for sun protection: hats, long sleeves and use of sunscreen if there is significant sun exposure.   Diet: the importance of a healthy diet is discussed. They do have a healthy diet.  The benefits of regular aerobic exercise were discussed. She walks 4 times per week ,  20 minutes.   Depression screen: there are no signs or vegative symptoms of depression- irritability, change in appetite, anhedonia, sadness/tearfullness.  Cognitive assessment: the patient manages all their financial and personal affairs and is actively engaged. They could relate day,date,year and events; recalled 2/3 objects at 3 minutes; performed clock-face test normally.  The following portions of the patient's history were reviewed and updated as appropriate: allergies, current medications, past family history, past medical history,  past surgical history, past social history  and problem list.  Visual acuity was not  assessed per patient preference since she has regular follow up with her ophthalmologist. Hearing and body mass index were assessed and reviewed.   During the course of the visit the patient was educated and counseled about appropriate screening and preventive services including : fall prevention , diabetes screening, nutrition counseling, colorectal cancer screening, and recommended immunizations.    CC: The primary encounter diagnosis was Cervical cancer screening. Diagnoses of Hyperlipidemia LDL goal <160, Recurrent major depressive disorder, in partial remission (HCC), Encounter for preventive health examination, Family history of colon cancer requiring screening colonoscopy, and Bilateral bunions were also pertinent to this visit.  Seeing Dr Maryruth Bun for management of depression, complicated by anxiety,  Caused by increased  financial strains incurred in support of her son's occupational expenses.     History Neoma has a past medical history of Depression, Diverticulosis, Dysrhythmia, GERD (gastroesophageal reflux disease), Headache disorder, History of colonic polyps (2011), and SVT (supraventricular tachycardia) (HCC).   She has a past surgical history that includes Colonoscopy with propofol (N/A, 05/19/2015); Dilation and curettage of uterus; Colonoscopy with propofol (N/A, 12/20/2019); and Colonoscopy with propofol (N/A, 07/11/2020).   Her family history includes Cancer (age of onset: 84) in her father; Mental illness in her mother.She reports that she has never smoked. She has never used smokeless tobacco. She reports that she does not drink alcohol and does not use drugs.  Outpatient Medications Prior to Visit  Medication Sig Dispense Refill  . AIMOVIG 70 MG/ML SOAJ SMARTSIG:70 Milligram(s) SUB-Q Once a Month    . buPROPion (WELLBUTRIN XL)  300 MG 24 hr tablet Take 300 mg by mouth daily.    . clonazePAM (KLONOPIN) 0.5 MG tablet   0  . denosumab (PROLIA) 60 MG/ML SOLN injection Inject 60 mg  into the skin once. Administer in upper arm, thigh, or abdomen 1 Syringe 1  . famotidine (PEPCID) 40 MG tablet Take 1 tablet (40 mg total) by mouth daily. 90 tablet 3  . imipramine (TOFRANIL) 50 MG tablet Take 50 mg by mouth 2 (two) times daily.    Marland Kitchen lamoTRIgine (LAMICTAL) 100 MG tablet Take 100 mg by mouth 3 (three) times daily.    . naratriptan (AMERGE) 2.5 MG tablet Take 2.5 mg by mouth daily as needed.    . diazepam (VALIUM) 5 MG tablet Take 1 tablet (5 mg total) by mouth every 12 (twelve) hours as needed for anxiety. (Patient not taking: No sig reported) 10 tablet 1  . diltiazem (CARDIZEM) 60 MG tablet Take 1 tablet (60 mg total) by mouth 4 (four) times daily. (Patient not taking: No sig reported) 30 tablet 0  . meloxicam (MOBIC) 7.5 MG tablet TAKE 1 TABLET BY MOUTH EVERY DAY AS NEEDED FOR PAIN (Patient not taking: No sig reported) 30 tablet 0  . predniSONE (DELTASONE) 10 MG tablet 6 tablets daily for 3 days, then reduce by 1 tablet daily until gone (Patient not taking: No sig reported) 33 tablet 0  . valACYclovir (VALTREX) 1000 MG tablet Take 1 tablet (1,000 mg total) by mouth 3 (three) times daily. (Patient not taking: No sig reported) 21 tablet 0   No facility-administered medications prior to visit.    Review of Systems   Patient denies headache, fevers, malaise, unintentional weight loss, skin rash, eye pain, sinus congestion and sinus pain, sore throat, dysphagia,  hemoptysis , cough, dyspnea, wheezing, chest pain, palpitations, orthopnea, edema, abdominal pain, nausea, melena, diarrhea, constipation, flank pain, dysuria, hematuria, urinary  Frequency, nocturia, numbness, tingling, seizures,  Focal weakness, Loss of consciousness,  Tremor, insomnia, and suicidal ideation.     Objective:  BP 112/64 (BP Location: Left Arm, Patient Position: Sitting, Cuff Size: Normal)   Pulse 91   Temp (!) 97.3 F (36.3 C) (Temporal)   Resp 16   Ht 5\' 5"  (1.651 m)   Wt 130 lb 6.4 oz (59.1 kg)    SpO2 99%   BMI 21.70 kg/m   Physical Exam  General Appearance:    Alert, cooperative, no distress, appears stated age  Head:    Normocephalic, without obvious abnormality, atraumatic  Eyes:    PERRL, conjunctiva/corneas clear, EOM's intact, fundi    benign, both eyes  Ears:    Normal TM's and external ear canals, both ears  Nose:   Nares normal, septum midline, mucosa normal, no drainage    or sinus tenderness  Throat:   Lips, mucosa, and tongue normal; teeth and gums normal  Neck:   Supple, symmetrical, trachea midline, no adenopathy;    thyroid:  no enlargement/tenderness/nodules; no carotid   bruit or JVD  Back:     Symmetric, no curvature, ROM normal, no CVA tenderness  Lungs:     Clear to auscultation bilaterally, respirations unlabored  Chest Wall:    No tenderness or deformity   Heart:    Regular rate and rhythm, S1 and S2 normal, no murmur, rub   or gallop  Breast Exam:    No tenderness, masses, or nipple abnormality  Abdomen:     Soft, non-tender, bowel sounds active all four quadrants,  no masses, no organomegaly  Genitalia:    Pelvic: cervix normal in appearance, external genitalia normal, no adnexal masses or tenderness, no cervical motion tenderness, rectovaginal septum normal, uterus normal size, shape, and consistency and vagina normal without discharge  Extremities:   Extremities normal, atraumatic, no cyanosis or edema. Bilateral bunions   Pulses:   2+ and symmetric all extremities  Skin:   Skin color, texture, turgor normal, no rashes or lesions  Lymph nodes:   Cervical, supraclavicular, and axillary nodes normal  Neurologic:   CNII-XII intact, normal strength, sensation and reflexes    throughout    Assessment & Plan:   Problem List Items Addressed This Visit      Unprioritized   Bilateral bunions    She has deferred surgery       Cervical cancer screening - Primary    PAP smear was done today      Relevant Orders   Cytology - PAP   Depression,  major, in partial remission (HCC)    Her symptoms are now managed by Dr Maryruth Bun  with wellbutrin and lamictal.        Encounter for preventive health examination    age appropriate education and counseling updated, referrals for preventative services and immunizations addressed, dietary and smoking counseling addressed, most recent labs reviewed.  I have personally reviewed and have noted:  1) the patient's medical and social history 2) The pt's use of alcohol, tobacco, and illicit drugs 3) The patient's current medications and supplements 4) Functional ability including ADL's, fall risk, home safety risk, hearing and visual impairment 5) Diet and physical activities 6) Evidence for depression or mood disorder 7) The patient's height, weight, and BMI have been recorded in the chart  I have made referrals, and provided counseling and education based on review of the above      Family history of colon cancer requiring screening colonoscopy    December 2021 colonoscopy was clear.  5 yr follow up advised in 2026      Hyperlipidemia LDL goal <160   Relevant Orders   Lipid panel (Completed)   TSH (Completed)   Comprehensive metabolic panel (Completed)      I have discontinued Jomaira Halseth's diltiazem, diazepam, meloxicam, predniSONE, and valACYclovir. I am also having her start on Tdap. Additionally, I am having her maintain her lamoTRIgine, buPROPion, imipramine, denosumab, clonazePAM, Aimovig, naratriptan, and famotidine.  Meds ordered this encounter  Medications  . Tdap (BOOSTRIX) 5-2.5-18.5 LF-MCG/0.5 injection    Sig: Inject 0.5 mLs into the muscle once for 1 dose.    Dispense:  0.5 mL    Refill:  0    Medications Discontinued During This Encounter  Medication Reason  . meloxicam (MOBIC) 7.5 MG tablet   . predniSONE (DELTASONE) 10 MG tablet Completed Course  . valACYclovir (VALTREX) 1000 MG tablet Completed Course  . diazepam (VALIUM) 5 MG tablet   . diltiazem (CARDIZEM)  60 MG tablet     Follow-up: No follow-ups on file.   Sherlene Shams, MD

## 2020-11-21 NOTE — Patient Instructions (Signed)
Good to see you Iliana!  Colonoscopy due in 2026  Tdap (vaccine ) due now.  rx given to you today to get at your pharmacy (less $$$)   For your constipation:  The first course is to increase the fiber in your diet to 25 g daily . Fruit and vegetables are good sources,  The [employer] and Atkins protein bars , and the low carb breads   (see below)  are all heavy on  fiber,    You can also take miralax, metamucil, fibercon, or citrucel daily to supplement your fiber. These are gentle and work in 1 to 2 days to relieve constipation, and  you can also combine them daily with colace,  A stool softener .  Also,  make 3 16 ounce servings of wtaer your minimum goal for water intake     HERE ARE THE LOW CARB  BREAD CHOICES  THAT ARE HIGHER IN FIBER THAN THE WELL KNOWN BREADS.  The [employer] MADE FROM WHOLE WHEAT HAS 26 G FIBER ! THESE CAN ALLALSO  BE BAKED TO CREATE PITA CHIPS

## 2020-11-22 DIAGNOSIS — M21611 Bunion of right foot: Secondary | ICD-10-CM | POA: Insufficient documentation

## 2020-11-22 LAB — COMPREHENSIVE METABOLIC PANEL
ALT: 28 U/L (ref 0–35)
AST: 24 U/L (ref 0–37)
Albumin: 4.3 g/dL (ref 3.5–5.2)
Alkaline Phosphatase: 55 U/L (ref 39–117)
BUN: 26 mg/dL — ABNORMAL HIGH (ref 6–23)
CO2: 23 mEq/L (ref 19–32)
Calcium: 9.1 mg/dL (ref 8.4–10.5)
Chloride: 105 mEq/L (ref 96–112)
Creatinine, Ser: 1.2 mg/dL (ref 0.40–1.20)
GFR: 48.02 mL/min — ABNORMAL LOW (ref >60.00)
Glucose, Bld: 82 mg/dL (ref 70–99)
Potassium: 4.4 mEq/L (ref 3.5–5.1)
Sodium: 139 mEq/L (ref 135–145)
Total Bilirubin: 0.3 mg/dL (ref 0.2–1.2)
Total Protein: 6.9 g/dL (ref 6.0–8.3)

## 2020-11-22 LAB — TSH: TSH: 1.62 u[IU]/mL (ref 0.35–4.50)

## 2020-11-22 LAB — LIPID PANEL
Cholesterol: 237 mg/dL — ABNORMAL HIGH (ref 0–200)
HDL: 54.2 mg/dL (ref >39.00)
LDL Cholesterol: 147 mg/dL — ABNORMAL HIGH (ref 0–99)
NonHDL: 182.32
Total CHOL/HDL Ratio: 4
Triglycerides: 175 mg/dL — ABNORMAL HIGH (ref 0.0–149.0)
VLDL: 35 mg/dL (ref 0.0–40.0)

## 2020-11-22 NOTE — Assessment & Plan Note (Signed)
Her symptoms are now managed by Dr Maryruth Bun  with wellbutrin and lamictal.

## 2020-11-22 NOTE — Assessment & Plan Note (Signed)
PAP smear was done today 

## 2020-11-22 NOTE — Assessment & Plan Note (Signed)
December 2021 colonoscopy was clear.  5 yr follow up advised in 2026 

## 2020-11-22 NOTE — Assessment & Plan Note (Signed)
She has deferred surgery

## 2020-11-22 NOTE — Assessment & Plan Note (Signed)

## 2020-11-23 LAB — CYTOLOGY - PAP
Comment: NEGATIVE
Diagnosis: NEGATIVE
High risk HPV: NEGATIVE

## 2020-12-05 DIAGNOSIS — M542 Cervicalgia: Secondary | ICD-10-CM | POA: Diagnosis not present

## 2020-12-05 DIAGNOSIS — G43719 Chronic migraine without aura, intractable, without status migrainosus: Secondary | ICD-10-CM | POA: Diagnosis not present

## 2020-12-15 DIAGNOSIS — F331 Major depressive disorder, recurrent, moderate: Secondary | ICD-10-CM | POA: Diagnosis not present

## 2020-12-15 DIAGNOSIS — F411 Generalized anxiety disorder: Secondary | ICD-10-CM | POA: Diagnosis not present

## 2020-12-15 DIAGNOSIS — F5105 Insomnia due to other mental disorder: Secondary | ICD-10-CM | POA: Diagnosis not present

## 2021-01-30 ENCOUNTER — Encounter: Payer: Self-pay | Admitting: Family Medicine

## 2021-01-30 ENCOUNTER — Ambulatory Visit: Payer: BC Managed Care – PPO | Admitting: Family Medicine

## 2021-01-30 ENCOUNTER — Other Ambulatory Visit: Payer: Self-pay

## 2021-01-30 DIAGNOSIS — L237 Allergic contact dermatitis due to plants, except food: Secondary | ICD-10-CM

## 2021-01-30 MED ORDER — FLUOCINONIDE 0.05 % EX CREA: 1.0000 "application " | TOPICAL_CREAM | Freq: Two times a day (BID) | CUTANEOUS | 0 refills | Status: DC | PRN

## 2021-01-30 MED ORDER — PREDNISONE 20 MG PO TABS: ORAL_TABLET | ORAL | 0 refills | Status: DC

## 2021-01-30 NOTE — Patient Instructions (Addendum)
Prednisone with food. Update [place] as needed.  If you have another flare, then start using lidex if needed.   Take care.  Glad to see you.

## 2021-01-30 NOTE — Progress Notes (Signed)
This visit occurred during the SARS-CoV-2 public health emergency.  Safety protocols were in place, including screening questions prior to the visit, additional usage of staff PPE, and extensive cleaning of exam room while observing appropriate contact time as indicated for disinfecting solutions.  Poison ivy.  L arm.  Tried OTC and triamcinolone topical tx.  Only on L arm.  No FCNAVD.  Known exposure.  History of similar in the past  Meds, vitals, and allergies reviewed.   ROS: Per HPI unless specifically indicated in ROS section   Nad Ncat Left forearm with 2 lesions noted, one of the flexor side and 1 on the extensor side, typical for poison ivy.  No spreading erythema.

## 2021-01-31 DIAGNOSIS — L237 Allergic contact dermatitis due to plants, except food: Secondary | ICD-10-CM | POA: Insufficient documentation

## 2021-01-31 NOTE — Assessment & Plan Note (Signed)
Does not appear superinfected.  Discussed options Prednisone with food. Update [place] as needed.  Routine steroid cautions given to patient. If another flare, then start using lidex if needed (early on).  Topical steroid cautions given to patient.

## 2021-02-05 DIAGNOSIS — M542 Cervicalgia: Secondary | ICD-10-CM | POA: Diagnosis not present

## 2021-02-05 DIAGNOSIS — G43719 Chronic migraine without aura, intractable, without status migrainosus: Secondary | ICD-10-CM | POA: Diagnosis not present

## 2021-02-22 DIAGNOSIS — B081 Molluscum contagiosum: Secondary | ICD-10-CM | POA: Diagnosis not present

## 2021-03-05 ENCOUNTER — Ambulatory Visit (INDEPENDENT_AMBULATORY_CARE_PROVIDER_SITE_OTHER): Payer: BC Managed Care – PPO

## 2021-03-05 ENCOUNTER — Other Ambulatory Visit: Payer: Self-pay

## 2021-03-05 DIAGNOSIS — M810 Age-related osteoporosis without current pathological fracture: Secondary | ICD-10-CM

## 2021-03-05 MED ORDER — DENOSUMAB 60 MG/ML ~~LOC~~ SOSY
60.0000 mg | PREFILLED_SYRINGE | Freq: Once | SUBCUTANEOUS | Status: AC
  Administered 2021-03-05: 60 mg via SUBCUTANEOUS

## 2021-03-05 NOTE — Progress Notes (Signed)
Patient presented for 6-month Prolia injection SQ to left arm. Patient tolerated well. 

## 2021-03-08 ENCOUNTER — Ambulatory Visit: Payer: BC Managed Care – PPO

## 2021-03-12 ENCOUNTER — Other Ambulatory Visit: Payer: Self-pay | Admitting: Internal Medicine

## 2021-03-16 DIAGNOSIS — F411 Generalized anxiety disorder: Secondary | ICD-10-CM | POA: Diagnosis not present

## 2021-03-16 DIAGNOSIS — F5105 Insomnia due to other mental disorder: Secondary | ICD-10-CM | POA: Diagnosis not present

## 2021-03-16 DIAGNOSIS — F331 Major depressive disorder, recurrent, moderate: Secondary | ICD-10-CM | POA: Diagnosis not present

## 2021-03-16 DIAGNOSIS — M1711 Unilateral primary osteoarthritis, right knee: Secondary | ICD-10-CM | POA: Diagnosis not present

## 2021-04-20 DIAGNOSIS — Z23 Encounter for immunization: Secondary | ICD-10-CM | POA: Diagnosis not present

## 2021-04-27 DIAGNOSIS — G43719 Chronic migraine without aura, intractable, without status migrainosus: Secondary | ICD-10-CM | POA: Diagnosis not present

## 2021-04-27 DIAGNOSIS — M542 Cervicalgia: Secondary | ICD-10-CM | POA: Diagnosis not present

## 2021-05-14 ENCOUNTER — Ambulatory Visit: Payer: Self-pay | Admitting: Medical

## 2021-05-16 ENCOUNTER — Other Ambulatory Visit: Payer: Self-pay

## 2021-05-16 ENCOUNTER — Ambulatory Visit: Payer: BC Managed Care – PPO

## 2021-05-16 DIAGNOSIS — Z23 Encounter for immunization: Secondary | ICD-10-CM

## 2021-06-11 DIAGNOSIS — F411 Generalized anxiety disorder: Secondary | ICD-10-CM | POA: Diagnosis not present

## 2021-06-11 DIAGNOSIS — F331 Major depressive disorder, recurrent, moderate: Secondary | ICD-10-CM | POA: Diagnosis not present

## 2021-06-11 DIAGNOSIS — F5105 Insomnia due to other mental disorder: Secondary | ICD-10-CM | POA: Diagnosis not present

## 2021-06-18 DIAGNOSIS — G43719 Chronic migraine without aura, intractable, without status migrainosus: Secondary | ICD-10-CM | POA: Diagnosis not present

## 2021-06-18 DIAGNOSIS — M542 Cervicalgia: Secondary | ICD-10-CM | POA: Diagnosis not present

## 2021-07-06 DIAGNOSIS — M1711 Unilateral primary osteoarthritis, right knee: Secondary | ICD-10-CM | POA: Diagnosis not present

## 2021-08-20 DIAGNOSIS — G43719 Chronic migraine without aura, intractable, without status migrainosus: Secondary | ICD-10-CM | POA: Diagnosis not present

## 2021-08-20 DIAGNOSIS — M542 Cervicalgia: Secondary | ICD-10-CM | POA: Diagnosis not present

## 2021-09-11 DIAGNOSIS — F331 Major depressive disorder, recurrent, moderate: Secondary | ICD-10-CM | POA: Diagnosis not present

## 2021-09-11 DIAGNOSIS — F5105 Insomnia due to other mental disorder: Secondary | ICD-10-CM | POA: Diagnosis not present

## 2021-09-11 DIAGNOSIS — F411 Generalized anxiety disorder: Secondary | ICD-10-CM | POA: Diagnosis not present

## 2021-09-19 ENCOUNTER — Other Ambulatory Visit: Payer: Self-pay

## 2021-09-19 ENCOUNTER — Encounter: Payer: Self-pay | Admitting: Medical

## 2021-09-19 ENCOUNTER — Ambulatory Visit: Payer: BC Managed Care – PPO | Admitting: Medical

## 2021-09-19 VITALS — BP 118/72 | HR 86 | Temp 98.3°F | Resp 16

## 2021-09-19 DIAGNOSIS — B9789 Other viral agents as the cause of diseases classified elsewhere: Secondary | ICD-10-CM

## 2021-09-19 NOTE — Progress Notes (Signed)
   Subjective:    Patient ID: Lauren Tanner, female    DOB: [birth year], 66 y.o.   MRN: [id number]  HPI 66 yo female in non acute distress. Nasal congestion prod yellow mucus , cough and headache starting on Wednesday. Denies fever or chills, cugh or SOB.  Blood pressure 118/72, pulse 86, temperature 98.3 F (36.8 C), temperature source Tympanic, resp. rate 16, SpO2 98 %.  Allergies  Allergen Reactions   Ampicillin Hives   Septra [Bactrim] Hives   Sulfamethoxazole-Trimethoprim Hives   Sulfa Antibiotics Hives    Review of Systems  HENT:  Positive for congestion, postnasal drip, rhinorrhea and sinus pain.   Respiratory:  Negative for cough.   Cardiovascular:  Negative for chest pain.       Objective:   Physical Exam Vitals and nursing note reviewed.  Constitutional:      Appearance: Normal appearance.  HENT:     Head: Normocephalic and atraumatic.     Right Ear: Tympanic membrane, ear canal and external ear normal.     Left Ear: Tympanic membrane, ear canal and external ear normal.     Nose: Congestion present.     Mouth/Throat:     Mouth: Mucous membranes are dry.     Pharynx: Oropharynx is clear.  Cardiovascular:     Rate and Rhythm: Normal rate and regular rhythm.     Pulses: Normal pulses.     Heart sounds: Normal heart sounds.  Pulmonary:     Effort: Pulmonary effort is normal.     Breath sounds: Normal breath sounds.  Musculoskeletal:        General: Normal range of motion.     Cervical back: Normal range of motion and neck supple.  Skin:    General: Skin is warm and dry.     Capillary Refill: Capillary refill takes less than 2 seconds.  Neurological:     General: No focal deficit present.     Mental Status: She is alert and oriented to person, place, and time.  Psychiatric:        Mood and Affect: Mood normal.        Behavior: Behavior normal.        Thought Content: Thought content normal.        Judgment: Judgment normal.           Assessment &  Plan:  Sinusitis viral No orders of the defined types were placed in this encounter.  Follow up in 7 days if symptoms are not improving.

## 2021-09-19 NOTE — Patient Instructions (Signed)

## 2021-09-19 NOTE — Progress Notes (Incomplete)
° °  Subjective:    Patient ID: Lauren Tanner, female    DOB: 05-01-1956, 66 y.o.   MRN: [id number]  HPI 66 yo female in non acute distress. One  week of sinus congestion , HA and a dry cough, no fever chills or shortness of breath.  Review of Systems  Constitutional:  Negative for chills and fever.  HENT:  Positive for congestion and rhinorrhea (yellow in the am , clear through out the day).   Respiratory:  Positive for cough (dry). Negative for shortness of breath.   Neurological:  Positive for headaches (forehead).      Objective:   Physical Exam Constitutional:      Appearance: Normal appearance. She is normal weight.  HENT:     Head: Normocephalic and atraumatic.     Right Ear: Tympanic membrane, ear canal and external ear normal.     Left Ear: Tympanic membrane, ear canal and external ear normal.     Nose: Congestion present.     Mouth/Throat:     Mouth: Mucous membranes are moist.     Pharynx: Oropharynx is clear.  Eyes:     Extraocular Movements: Extraocular movements intact.     Conjunctiva/sclera: Conjunctivae normal.     Pupils: Pupils are equal, round, and reactive to light.  Cardiovascular:     Rate and Rhythm: Normal rate and regular rhythm.  Pulmonary:     Effort: Pulmonary effort is normal.     Breath sounds: Normal breath sounds.  Musculoskeletal:        General: Normal range of motion.     Cervical back: Normal range of motion and neck supple.  Skin:    General: Skin is warm and dry.  Neurological:     General: No focal deficit present.     Mental Status: She is alert and oriented to person, place, and time.  Psychiatric:        Mood and Affect: Mood normal.        Behavior: Behavior normal.        Thought Content: Thought content normal.        Judgment: Judgment normal.          Assessment & Plan:  Viral sinusitis Recommend OTC Flonase, Zyrtec or Claritin, and plain Mucinex all per package instructions. To call if yellow turns to geen or

## 2021-10-11 ENCOUNTER — Telehealth: Payer: Self-pay | Admitting: Internal Medicine

## 2021-10-11 NOTE — Telephone Encounter (Signed)
Patient prolia approved until 01/2022, patient scheduled. ?

## 2021-10-19 ENCOUNTER — Ambulatory Visit (INDEPENDENT_AMBULATORY_CARE_PROVIDER_SITE_OTHER): Payer: BC Managed Care – PPO | Admitting: *Deleted

## 2021-10-19 ENCOUNTER — Other Ambulatory Visit: Payer: Self-pay

## 2021-10-19 ENCOUNTER — Ambulatory Visit: Payer: BC Managed Care – PPO

## 2021-10-19 DIAGNOSIS — G43719 Chronic migraine without aura, intractable, without status migrainosus: Secondary | ICD-10-CM | POA: Diagnosis not present

## 2021-10-19 DIAGNOSIS — M810 Age-related osteoporosis without current pathological fracture: Secondary | ICD-10-CM

## 2021-10-19 DIAGNOSIS — M542 Cervicalgia: Secondary | ICD-10-CM | POA: Diagnosis not present

## 2021-10-19 MED ORDER — DENOSUMAB 60 MG/ML ~~LOC~~ SOSY
60.0000 mg | PREFILLED_SYRINGE | Freq: Once | SUBCUTANEOUS | Status: AC
  Administered 2021-10-19: 60 mg via SUBCUTANEOUS

## 2021-10-19 NOTE — Progress Notes (Signed)
Patient presented for Prolia injection to Right arm Lauren Tanner, patient voiced no concerns or complaints during or after injection. 

## 2021-12-06 DIAGNOSIS — F331 Major depressive disorder, recurrent, moderate: Secondary | ICD-10-CM | POA: Diagnosis not present

## 2021-12-06 DIAGNOSIS — F5105 Insomnia due to other mental disorder: Secondary | ICD-10-CM | POA: Diagnosis not present

## 2021-12-06 DIAGNOSIS — F411 Generalized anxiety disorder: Secondary | ICD-10-CM | POA: Diagnosis not present

## 2021-12-10 ENCOUNTER — Other Ambulatory Visit: Payer: Self-pay | Admitting: Internal Medicine

## 2021-12-14 DIAGNOSIS — G43719 Chronic migraine without aura, intractable, without status migrainosus: Secondary | ICD-10-CM | POA: Diagnosis not present

## 2021-12-14 DIAGNOSIS — M542 Cervicalgia: Secondary | ICD-10-CM | POA: Diagnosis not present

## 2022-01-21 ENCOUNTER — Ambulatory Visit: Payer: BC Managed Care – PPO | Admitting: Medical

## 2022-01-21 ENCOUNTER — Encounter: Payer: Self-pay | Admitting: Medical

## 2022-01-21 VITALS — BP 116/78 | HR 87 | Temp 98.8°F | Wt 128.4 lb

## 2022-01-21 DIAGNOSIS — L237 Allergic contact dermatitis due to plants, except food: Secondary | ICD-10-CM

## 2022-01-21 MED ORDER — METHYLPREDNISOLONE SODIUM SUCC 125 MG IJ SOLR
125.0000 mg | Freq: Once | INTRAMUSCULAR | Status: AC
Start: 2022-01-21 — End: 2022-01-21
  Administered 2022-01-21: 125 mg via INTRAMUSCULAR

## 2022-01-21 NOTE — Progress Notes (Signed)
   Subjective:    Patient ID: Lauren Tanner, female    DOB: 27-Dec-1955, 66 y.o.   MRN: [id number]  HPI 66 yo female in non acute distress, presents today with an area the size of a 1/2 dollar on top of wrist area x10 days, itchy but not painful.   Review of Systems  Skin:  Positive for rash (itchy not painul  x 10 days.).   Has tried  prescription cortisone cream without improvement.    Objective:   Physical Exam   AXO, RRR, CTAB  Maculopapular, fine blistering with erythema.     Assessment & Plan:   Poison ivy left wrist. Meds ordered this encounter  Medications   methylPREDNISolone sodium succinate (SOLU-MEDROL) 125 mg/2 mL injection 125 mg   She may call for a prednisone taper if she does not  get improvement with the injection of steroids. She verbalizes understanding and has no questions at discharge.

## 2022-02-08 DIAGNOSIS — M542 Cervicalgia: Secondary | ICD-10-CM | POA: Diagnosis not present

## 2022-02-08 DIAGNOSIS — G43719 Chronic migraine without aura, intractable, without status migrainosus: Secondary | ICD-10-CM | POA: Diagnosis not present

## 2022-02-11 ENCOUNTER — Ambulatory Visit (INDEPENDENT_AMBULATORY_CARE_PROVIDER_SITE_OTHER): Payer: BC Managed Care – PPO | Admitting: Internal Medicine

## 2022-02-11 VITALS — BP 110/72 | HR 89 | Temp 97.6°F | Ht 65.0 in | Wt 127.8 lb

## 2022-02-11 DIAGNOSIS — E785 Hyperlipidemia, unspecified: Secondary | ICD-10-CM | POA: Diagnosis not present

## 2022-02-11 DIAGNOSIS — Z113 Encounter for screening for infections with a predominantly sexual mode of transmission: Secondary | ICD-10-CM

## 2022-02-11 DIAGNOSIS — Z0000 Encounter for general adult medical examination without abnormal findings: Secondary | ICD-10-CM | POA: Diagnosis not present

## 2022-02-11 DIAGNOSIS — Z800 Family history of malignant neoplasm of digestive organs: Secondary | ICD-10-CM

## 2022-02-11 DIAGNOSIS — M816 Localized osteoporosis [Lequesne]: Secondary | ICD-10-CM

## 2022-02-11 DIAGNOSIS — E559 Vitamin D deficiency, unspecified: Secondary | ICD-10-CM

## 2022-02-11 DIAGNOSIS — F3341 Major depressive disorder, recurrent, in partial remission: Secondary | ICD-10-CM

## 2022-02-11 DIAGNOSIS — Z1231 Encounter for screening mammogram for malignant neoplasm of breast: Secondary | ICD-10-CM

## 2022-02-11 DIAGNOSIS — G510 Bell's palsy: Secondary | ICD-10-CM

## 2022-02-11 LAB — COMPREHENSIVE METABOLIC PANEL
ALT: 27 U/L (ref 0–35)
AST: 23 U/L (ref 0–37)
Albumin: 4.1 g/dL (ref 3.5–5.2)
Alkaline Phosphatase: 45 U/L (ref 39–117)
BUN: 19 mg/dL (ref 6–23)
CO2: 29 mEq/L (ref 19–32)
Calcium: 9.1 mg/dL (ref 8.4–10.5)
Chloride: 103 mEq/L (ref 96–112)
Creatinine, Ser: 1.09 mg/dL (ref 0.40–1.20)
GFR: 53.43 mL/min — ABNORMAL LOW (ref >60.00)
Glucose, Bld: 89 mg/dL (ref 70–99)
Potassium: 4.2 mEq/L (ref 3.5–5.1)
Sodium: 139 mEq/L (ref 135–145)
Total Bilirubin: 0.4 mg/dL (ref 0.2–1.2)
Total Protein: 5.9 g/dL — ABNORMAL LOW (ref 6.0–8.3)

## 2022-02-11 LAB — VITAMIN D 25 HYDROXY (VIT D DEFICIENCY, FRACTURES): VITD: 60.09 ng/mL (ref 30.00–100.00)

## 2022-02-11 LAB — LIPID PANEL
Cholesterol: 217 mg/dL — ABNORMAL HIGH (ref 0–200)
HDL: 48.6 mg/dL (ref >39.00)
LDL Cholesterol: 136 mg/dL — ABNORMAL HIGH (ref 0–99)
NonHDL: 168.2
Total CHOL/HDL Ratio: 4
Triglycerides: 161 mg/dL — ABNORMAL HIGH (ref 0.0–149.0)
VLDL: 32.2 mg/dL (ref 0.0–40.0)

## 2022-02-11 LAB — TSH: TSH: 2.89 u[IU]/mL (ref 0.35–5.50)

## 2022-02-11 MED ORDER — ZOSTER VAC RECOMB ADJUVANTED 50 MCG/0.5ML IM SUSR: 0.5000 mL | Freq: Once | INTRAMUSCULAR | 1 refills | Status: AC

## 2022-02-11 MED ORDER — TETANUS-DIPHTH-ACELL PERTUSSIS 5-2.5-18.5 LF-MCG/0.5 IM SUSY: 0.5000 mL | PREFILLED_SYRINGE | Freq: Once | INTRAMUSCULAR | 0 refills | Status: AC

## 2022-02-11 NOTE — Assessment & Plan Note (Signed)
December 2021 colonoscopy was clear.  5 yr follow up advised in 2026

## 2022-02-11 NOTE — Assessment & Plan Note (Addendum)
Her symptoms are now managed by Dr Maryruth Bun

## 2022-02-11 NOTE — Assessment & Plan Note (Signed)
D\age appropriate education and counseling updated, referrals for preventative services and immunizations addressed, dietary and smoking counseling addressed, most recent labs reviewed.  I have personally reviewed and have noted:   1) the patient's medical and social history 2) The pt's use of alcohol, tobacco, and illicit drugs 3) The patient's current medications and supplements 4) Functional ability including ADL's, fall risk, home safety risk, hearing and visual impairment 5) Diet and physical activities 6) Evidence for depression or mood disorder 7) The patient's height, weight, and BMI have been recorded in the chart.   I have made referrals, and provided counseling and education based on review of the above

## 2022-02-11 NOTE — Progress Notes (Signed)
Patient ID: Lauren Tanner, female    DOB: [birth year]  Age: 66 y.o. MRN: [id number]  The patient is here for annual preventive  examination and management of other chronic and acute problems.   The risk factors are reflected in the social history.  The roster of all physicians providing medical care to patient - is listed in the Snapshot section of the chart.  Activities of daily living:  The patient is 100% independent in all ADLs: dressing, toileting, feeding as well as independent mobility  Home safety : The patient has smoke detectors in the home. They wear seatbelts.  There are no firearms at home. There is no violence in the home.   There is no risks for hepatitis, STDs or HIV. There is no   history of blood transfusion. They have no travel history to infectious disease endemic areas of the world.  The patient has seen their dentist in the last six month. They have seen their eye doctor in the last year. They admit to slight hearing difficulty with regard to whispered voices and some television programs.  They have deferred audiologic testing in the last year.  They do not  have excessive sun exposure. Discussed the need for sun protection: hats, long sleeves and use of sunscreen if there is significant sun exposure.   Diet: the importance of a healthy diet is discussed. They do have a healthy diet.  The benefits of regular aerobic exercise were discussed. She walks 4 times per week ,  30 minutes.   Depression screen: there are no signs or vegative symptoms of depression- irritability, change in appetite, anhedonia, sadness/tearfullness.  Cognitive assessment: the patient manages all their financial and personal affairs and is actively engaged. They could relate day,date,year and events; recalled 2/3 objects at 3 minutes; performed clock-face test normally.  The following portions of the patient's history were reviewed and updated as appropriate: allergies, current medications, past family  history, past medical history,  past surgical history, past social history  and problem list.  Visual acuity was not assessed per patient preference since she has regular follow up with her ophthalmologist. Hearing and body mass index were assessed and reviewed.   During the course of the visit the patient was educated and counseled about appropriate screening and preventive services including : fall prevention , diabetes screening, nutrition counseling, colorectal cancer screening, and recommended immunizations.    CC: The primary encounter diagnosis was Screen for STD (sexually transmitted disease). Diagnoses of Hyperlipidemia LDL goal <160, Vitamin D deficiency, Encounter for screening mammogram for malignant neoplasm of breast, Localized osteoporosis without current pathological fracture, Bell's palsy, Recurrent major depressive disorder, in partial remission (HCC), Encounter for preventive health examination, and Family history of colon cancer requiring screening colonoscopy were also pertinent to this visit.  History Riana has a past medical history of Depression, Diverticulosis, Dysrhythmia, GERD (gastroesophageal reflux disease), Headache disorder, History of colonic polyps (2011), and SVT (supraventricular tachycardia) (HCC).   She has a past surgical history that includes Colonoscopy with propofol (N/A, 05/19/2015); Dilation and curettage of uterus; Colonoscopy with propofol (N/A, 12/20/2019); and Colonoscopy with propofol (N/A, 07/11/2020).   Her family history includes Cancer (age of onset: 15) in her father; Mental illness in her mother.She reports that she has never smoked. She has never used smokeless tobacco. She reports that she does not drink alcohol and does not use drugs.  Outpatient Medications Prior to Visit  Medication Sig Dispense Refill   AIMOVIG 70 MG/ML SOAJ SMARTSIG:70 Milligram(s)  SUB-Q Once a Month     buPROPion (WELLBUTRIN XL) 300 MG 24 hr tablet Take 300 mg by mouth  daily.     clonazePAM (KLONOPIN) 0.5 MG tablet   0   denosumab (PROLIA) 60 MG/ML SOLN injection Inject 60 mg into the skin once. Administer in upper arm, thigh, or abdomen 1 Syringe 1   famotidine (PEPCID) 40 MG tablet TAKE 1 TABLET DAILY 90 tablet 3   fluocinonide cream (LIDEX) 0.05 % Apply 1 application topically 2 (two) times daily as needed (for poison ivy). 30 g 0   imipramine (TOFRANIL) 50 MG tablet Take 50 mg by mouth 2 (two) times daily.     lamoTRIgine (LAMICTAL) 100 MG tablet Take 100 mg by mouth 3 (three) times daily.     naratriptan (AMERGE) 2.5 MG tablet Take 2.5 mg by mouth daily as needed.     No facility-administered medications prior to visit.    Review of Systems  Patient denies headache, fevers, malaise, unintentional weight loss, skin rash, eye pain, sinus congestion and sinus pain, sore throat, dysphagia,  hemoptysis , cough, dyspnea, wheezing, chest pain, palpitations, orthopnea, edema, abdominal pain, nausea, melena, diarrhea, constipation, flank pain, dysuria, hematuria, urinary  Frequency, nocturia, numbness, tingling, seizures,  Focal weakness, Loss of consciousness,  Tremor, insomnia, depression, anxiety, and suicidal ideation.     Objective:  BP 110/72 (BP Location: Left Arm, Patient Position: Sitting, Cuff Size: Normal)   Pulse 89   Temp 97.6 F (36.4 C) (Oral)   Ht 5\' 5"  (1.651 m)   Wt 127 lb 12.8 oz (58 kg)   SpO2 99%   BMI 21.27 kg/m   Physical Exam  General appearance: alert, cooperative and appears stated age Head: Normocephalic, without obvious abnormality, atraumatic Eyes: conjunctivae/corneas clear. PERRL, EOM's intact. Fundi benign. Ears: normal TM's and external ear canals both ears Nose: Nares normal. Septum midline. Mucosa normal. No drainage or sinus tenderness. Throat: lips, mucosa, and tongue normal; teeth and gums normal Neck: no adenopathy, no carotid bruit, no JVD, supple, symmetrical, trachea midline and thyroid not enlarged,  symmetric, no tenderness/mass/nodules Lungs: clear to auscultation bilaterally Breasts: normal appearance, no masses or tenderness Heart: regular rate and rhythm, S1, S2 normal, no murmur, click, rub or gallop Abdomen: soft, non-tender; bowel sounds normal; no masses,  no organomegaly Extremities: extremities normal, atraumatic, no cyanosis or edema Pulses: 2+ and symmetric Skin: Skin color, texture, turgor normal. No rashes or lesions Neurologic: Alert and oriented X 3, normal strength and tone. Normal symmetric reflexes. Normal coordination and gait.     Assessment & Plan:   Problem List Items Addressed This Visit     Hyperlipidemia LDL goal <160   Relevant Orders   TSH (Completed)   Comprehensive metabolic panel (Completed)   Lipid panel (Completed)   Depression, major, in partial remission (HCC)    Her symptoms are now managed by Dr       Bell's palsy    She has had intermittent numbness of a small patch of occipital scalp since her episode      Encounter for preventive health examination    D\age appropriate education and counseling updated, referrals for preventative services and immunizations addressed, dietary and smoking counseling addressed, most recent labs reviewed.  I have personally reviewed and have noted:   1) the patient's medical and social history 2) The pt's use of alcohol, tobacco, and illicit drugs 3) The patient's current medications and supplements 4) Functional ability including ADL's, fall risk, home  safety risk, hearing and visual impairment 5) Diet and physical activities 6) Evidence for depression or mood disorder 7) The patient's height, weight, and BMI have been recorded in the chart.   I have made referrals, and provided counseling and education based on review of the above      Family history of colon cancer requiring screening colonoscopy    December 2021 colonoscopy was clear.  5 yr follow up advised in 2026      Other Visit  Diagnoses     Screen for STD (sexually transmitted disease)    -  Primary   Relevant Orders   HIV Antibody (routine testing w rflx)   Vitamin D deficiency       Relevant Orders   VITAMIN D 25 Hydroxy (Vit-D Deficiency, Fractures) (Completed)   Encounter for screening mammogram for malignant neoplasm of breast       Relevant Orders   MM DIGITAL SCREENING BILATERAL   Localized osteoporosis without current pathological fracture       Relevant Orders   DG Bone Density       I am having Mela Beecham start on Tdap and Zoster Vaccine Adjuvanted. I am also having her maintain her lamoTRIgine, buPROPion, imipramine, denosumab, clonazePAM, Aimovig, naratriptan, fluocinonide cream, and famotidine.  Meds ordered this encounter  Medications   Tdap (BOOSTRIX) 5-2.5-18.5 LF-MCG/0.5 injection    Sig: Inject 0.5 mLs into the muscle once for 1 dose.    Dispense:  0.5 mL    Refill:  0   Zoster Vaccine Adjuvanted [facility]) injection    Sig: Inject 0.5 mLs into the muscle once for 1 dose.    Dispense:  1 each    Refill:  1    There are no discontinued medications.  Follow-up: No follow-ups on file.   Sherlene Shams, MD

## 2022-02-11 NOTE — Assessment & Plan Note (Signed)
She has had intermittent numbness of a small patch of occipital scalp since her episode

## 2022-02-12 LAB — HIV ANTIBODY (ROUTINE TESTING W REFLEX): HIV 1&2 Ab, 4th Generation: NONREACTIVE

## 2022-02-14 ENCOUNTER — Other Ambulatory Visit: Payer: Self-pay | Admitting: Internal Medicine

## 2022-02-14 DIAGNOSIS — Z1231 Encounter for screening mammogram for malignant neoplasm of breast: Secondary | ICD-10-CM

## 2022-02-18 ENCOUNTER — Encounter: Payer: Self-pay | Admitting: Internal Medicine

## 2022-02-19 ENCOUNTER — Encounter: Payer: Self-pay | Admitting: Internal Medicine

## 2022-02-19 NOTE — Addendum Note (Signed)
Addended by: Sherlene Shams on: 02/19/2022 12:34 PM   Modules accepted: Orders

## 2022-02-20 ENCOUNTER — Ambulatory Visit
Admission: RE | Admit: 2022-02-20 | Discharge: 2022-02-20 | Disposition: A | Discharge status: Home / Self Care | Payer: BC Managed Care – PPO | Source: Ambulatory Visit | Attending: Internal Medicine | Admitting: Internal Medicine

## 2022-02-20 DIAGNOSIS — Z1231 Encounter for screening mammogram for malignant neoplasm of breast: Secondary | ICD-10-CM | POA: Diagnosis not present

## 2022-02-20 NOTE — Progress Notes (Signed)
Patient ID: Lauren Tanner, female   DOB: 07/01/1956, 66 y.o.   MRN: [id number]  Patient ID: Lauren Tanner, female    DOB: 1956/01/26, 66 y.o.   MRN: [id number]  HPI 66 yo female in non acute distress. Nasal congestion prod yellow mucus , cough and headache starting on Wednesday. Denies fever or chills, cugh or SOB.  Blood pressure 118/72, pulse 86, temperature 98.3 F (36.8 C), temperature source Tympanic, resp. rate 16, SpO2 98 %.  Allergies  Allergen Reactions   Ampicillin Hives   Septra [Bactrim] Hives   Sulfamethoxazole-Trimethoprim Hives   Sulfa Antibiotics Hives    Review of Systems  HENT:  Positive for congestion, postnasal drip, rhinorrhea and sinus pain.   Respiratory:  Negative for cough.   Cardiovascular:  Negative for chest pain.       Objective:   Physical Exam Vitals and nursing note reviewed.  Constitutional:      Appearance: Normal appearance.  HENT:     Head: Normocephalic and atraumatic.     Right Ear: Tympanic membrane, ear canal and external ear normal.     Left Ear: Tympanic membrane, ear canal and external ear normal.     Nose: Congestion present.     Mouth/Throat:     Mouth: Mucous membranes are dry.     Pharynx: Oropharynx is clear.  Cardiovascular:     Rate and Rhythm: Normal rate and regular rhythm.     Pulses: Normal pulses.     Heart sounds: Normal heart sounds.  Pulmonary:     Effort: Pulmonary effort is normal.     Breath sounds: Normal breath sounds.  Musculoskeletal:        General: Normal range of motion.     Cervical back: Normal range of motion and neck supple.  Skin:    General: Skin is warm and dry.     Capillary Refill: Capillary refill takes less than 2 seconds.  Neurological:     General: No focal deficit present.     Mental Status: She is alert and oriented to person, place, and time.  Psychiatric:        Mood and Affect: Mood normal.        Behavior: Behavior normal.        Thought Content: Thought content normal.         Judgment: Judgment normal.           Assessment & Plan:  Sinusitis viral No orders of the defined types were placed in this encounter.  Follow up in 7 days if symptoms are not improving. Patient verbalizes understandign and has no questions at discharge.

## 2022-02-21 ENCOUNTER — Encounter: Payer: Self-pay | Admitting: Internal Medicine

## 2022-02-22 ENCOUNTER — Encounter: Payer: Self-pay | Admitting: Internal Medicine

## 2022-02-22 ENCOUNTER — Ambulatory Visit: Payer: BC Managed Care – PPO | Admitting: Internal Medicine

## 2022-02-22 ENCOUNTER — Telehealth: Payer: Self-pay | Admitting: Internal Medicine

## 2022-02-22 VITALS — BP 118/64 | HR 72 | Resp 12 | Wt 127.0 lb

## 2022-02-22 DIAGNOSIS — M810 Age-related osteoporosis without current pathological fracture: Secondary | ICD-10-CM | POA: Diagnosis not present

## 2022-02-22 DIAGNOSIS — N1832 Chronic kidney disease, stage 3b: Secondary | ICD-10-CM

## 2022-02-22 DIAGNOSIS — N1831 Chronic kidney disease, stage 3a: Secondary | ICD-10-CM

## 2022-02-22 DIAGNOSIS — F3341 Major depressive disorder, recurrent, in partial remission: Secondary | ICD-10-CM | POA: Diagnosis not present

## 2022-02-22 DIAGNOSIS — Z800 Family history of malignant neoplasm of digestive organs: Secondary | ICD-10-CM | POA: Diagnosis not present

## 2022-02-22 LAB — MICROALBUMIN / CREATININE URINE RATIO
Creatinine,U: 86.2 mg/dL
Microalb Creat Ratio: 1.2 mg/g (ref 0.0–30.0)
Microalb, Ur: 1.1 mg/dL (ref 0.0–1.9)

## 2022-02-22 LAB — CBC WITH DIFFERENTIAL/PLATELET
Basophils Absolute: 0 10*3/uL (ref 0.0–0.1)
Basophils Relative: 0.5 % (ref 0.0–3.0)
Eosinophils Absolute: 0 10*3/uL (ref 0.0–0.7)
Eosinophils Relative: 0 % (ref 0.0–5.0)
HCT: 33.5 % — ABNORMAL LOW (ref 36.0–46.0)
Hemoglobin: 11.5 g/dL — ABNORMAL LOW (ref 12.0–15.0)
Lymphocytes Relative: 31.4 % (ref 12.0–46.0)
Lymphs Abs: 1.7 10*3/uL (ref 0.7–4.0)
MCHC: 34.3 g/dL (ref 30.0–36.0)
MCV: 95.1 fl (ref 78.0–100.0)
Monocytes Absolute: 0.4 10*3/uL (ref 0.1–1.0)
Monocytes Relative: 6.8 % (ref 3.0–12.0)
Neutro Abs: 3.3 10*3/uL (ref 1.4–7.7)
Neutrophils Relative %: 61.3 % (ref 43.0–77.0)
Platelets: 285 10*3/uL (ref 150.0–400.0)
RBC: 3.53 Mil/uL — ABNORMAL LOW (ref 3.87–5.11)
RDW: 13 % (ref 11.5–15.5)
WBC: 5.4 10*3/uL (ref 4.0–10.5)

## 2022-02-22 NOTE — Assessment & Plan Note (Signed)
Managed by Dr Maryruth Bun.

## 2022-02-22 NOTE — Patient Instructions (Signed)
  I have ordered a renal ultrasound .  If you are not contacted about this BY NEXT FRIDAY,  let me know  Same goes for the DEXA scan

## 2022-02-22 NOTE — Telephone Encounter (Signed)
Lft pt vm on both numbers to call ofc . thanks 

## 2022-02-22 NOTE — Progress Notes (Addendum)
Subjective:  Patient ID: Lauren Tanner, female    DOB: 1955/11/08  Age: 66 y.o. MRN: [id number]  CC: The primary encounter diagnosis was Stage 3a chronic kidney disease (HCC). Diagnoses of Recurrent major depressive disorder, in partial remission (HCC), Family history of colon cancer requiring screening colonoscopy, Chronic kidney disease, stage 3b (HCC), and Osteoporosis, post-menopausal were also pertinent to this visit.   HPI Dana Debo presents for  Chief Complaint  Patient presents with   Follow-up    Follow up on GFR   Abnormal GFR.    GFR has been <60 for the last 1-2 years.   She has no history of hypertension or diabetes.  Does not take NSAIDs on a regular basis ,  USES NARATRIPTAN for headaches.   No FH of hereditary kidney disorders.  Averages about 40 ounces of water daily. No history of recurrent UTI or of nephrolithiasis. No unintentional weight loss, night sweats, or frequent infections   Outpatient Medications Prior to Visit  Medication Sig Dispense Refill   AIMOVIG 70 MG/ML SOAJ SMARTSIG:70 Milligram(s) SUB-Q Once a Month     buPROPion (WELLBUTRIN XL) 300 MG 24 hr tablet Take 300 mg by mouth daily.     clonazePAM (KLONOPIN) 0.5 MG tablet   0   denosumab (PROLIA) 60 MG/ML SOLN injection Inject 60 mg into the skin once. Administer in upper arm, thigh, or abdomen 1 Syringe 1   famotidine (PEPCID) 40 MG tablet TAKE 1 TABLET DAILY 90 tablet 3   imipramine (TOFRANIL) 50 MG tablet Take 50 mg by mouth 2 (two) times daily.     lamoTRIgine (LAMICTAL) 100 MG tablet Take 100 mg by mouth 3 (three) times daily.     naratriptan (AMERGE) 2.5 MG tablet Take 2.5 mg by mouth daily as needed.     fluocinonide cream (LIDEX) 0.05 % Apply 1 application topically 2 (two) times daily as needed (for poison ivy). 30 g 0   No facility-administered medications prior to visit.    Review of Systems;  Patient denies headache, fevers, malaise, unintentional weight loss, skin rash, eye pain, sinus  congestion and sinus pain, sore throat, dysphagia,  hemoptysis , cough, dyspnea, wheezing, chest pain, palpitations, orthopnea, edema, abdominal pain, nausea, melena, diarrhea, constipation, flank pain, dysuria, hematuria, urinary  Frequency, nocturia, numbness, tingling, seizures,  Focal weakness, Loss of consciousness,  Tremor, insomnia, depression, anxiety, and suicidal ideation.      Objective:  BP 118/64   Pulse 72   Resp 12   Wt 127 lb (57.6 kg)   SpO2 97%   BMI 21.13 kg/m   BP Readings from Last 3 Encounters:  02/24/22 118/64  02/11/22 110/72  01/21/22 116/78    Wt Readings from Last 3 Encounters:  02/24/22 127 lb (57.6 kg)  02/11/22 127 lb 12.8 oz (58 kg)  01/21/22 128 lb 6.4 oz (58.2 kg)    General appearance: alert, cooperative and appears stated age Ears: normal TM's and external ear canals both ears Throat: lips, mucosa, and tongue normal; teeth and gums normal Neck: no adenopathy, no carotid bruit, supple, symmetrical, trachea midline and thyroid not enlarged, symmetric, no tenderness/mass/nodules Back: symmetric, no curvature. ROM normal. No CVA tenderness. Lungs: clear to auscultation bilaterally Heart: regular rate and rhythm, S1, S2 normal, no murmur, click, rub or gallop Abdomen: soft, non-tender; bowel sounds normal; no masses,  no organomegaly Pulses: 2+ and symmetric Skin: Skin color, texture, turgor normal. No rashes or lesions Lymph nodes: Cervical, supraclavicular, and axillary nodes normal.  No results found for: "HGBA1C"  Lab Results  Component Value Date   CREATININE 1.09 02/11/2022   CREATININE 1.20 11/21/2020   CREATININE 1.08 (H) 03/14/2020    Lab Results  Component Value Date   WBC 5.4 02/22/2022   HGB 11.5 (L) 02/22/2022   HCT 33.5 (L) 02/22/2022   PLT 285.0 02/22/2022   GLUCOSE 89 02/11/2022   CHOL 217 (H) 02/11/2022   TRIG 161.0 (H) 02/11/2022   HDL 48.60 02/11/2022   LDLDIRECT 135.9 11/17/2012   LDLCALC 136 (H) 02/11/2022    ALT 27 02/11/2022   AST 23 02/11/2022   NA 139 02/11/2022   K 4.2 02/11/2022   CL 103 02/11/2022   CREATININE 1.09 02/11/2022   BUN 19 02/11/2022   CO2 29 02/11/2022   TSH 2.89 02/11/2022   INR 1.0 03/14/2020   MICROALBUR 1.1 02/22/2022    No results found.  Assessment & Plan:   Problem List Items Addressed This Visit     Osteoporosis, post-menopausal    Managed with Prolia semi annually  since 2014 with improved T scores.  DEXA ordered       Depression, major, in partial remission (HCC)    Managed by Dr Maryruth Bun.       Family history of colon cancer requiring screening colonoscopy   Stage 3a chronic kidney disease (HCC) - Primary    Etiology unclear.  Screening for nephropathy, MM  And renal ultrasound ordered. Encouraged to increase her daily intake to 60 ounces daily . The condition is now considered Chronic, based on review of GFR for the last year.  Screening labs and ultrasound ordered.  Does not use NSAIDs..  Advised to increase water intake with goal 60 ounces daily .  Allergic to septra       Relevant Orders   Microalbumin / creatinine urine ratio (Completed)   Protein electrophoresis, serum (Completed)   IFE AND PE, RANDOM URINE (Completed)   CBC with Differential/Platelet (Completed)   US Renal   RESOLVED: Chronic kidney disease, stage 3b (HCC)    I spent a total of  31 minutes with this patient in a face to face visit on the date of this encounter reviewing the last office visit with me in mid July ,  patient'ss diet , se of OTC meds, , family history  and post visit ordering of testing and therapeutics.    Follow-up: No follow-ups on file.   Sherlene Shams, MD

## 2022-02-22 NOTE — Assessment & Plan Note (Addendum)
Etiology unclear.  Screening for nephropathy, MM  And renal ultrasound ordered. Encouraged to increase her daily intake to 60 ounces daily . The condition is now considered Chronic, based on review of GFR for the last year.  Screening labs and ultrasound ordered.  Does not use NSAIDs..  Advised to increase water intake with goal 60 ounces daily .  Allergic to septra

## 2022-02-24 ENCOUNTER — Encounter: Payer: Self-pay | Admitting: Internal Medicine

## 2022-02-24 DIAGNOSIS — N1832 Chronic kidney disease, stage 3b: Secondary | ICD-10-CM | POA: Insufficient documentation

## 2022-02-24 NOTE — Assessment & Plan Note (Addendum)
Managed with Prolia semi annually  since 2014 with improved T scores.  DEXA ordered

## 2022-02-24 NOTE — Assessment & Plan Note (Deleted)
Etiology unclear.  Screening for nephropathy, MM  And renal ultrasound ordered. Encouraged to increase her daily intake to 60 ounces daily

## 2022-02-26 ENCOUNTER — Other Ambulatory Visit: Payer: Self-pay | Admitting: Internal Medicine

## 2022-02-26 ENCOUNTER — Encounter: Payer: Self-pay | Admitting: Internal Medicine

## 2022-02-26 DIAGNOSIS — D649 Anemia, unspecified: Secondary | ICD-10-CM | POA: Insufficient documentation

## 2022-02-26 LAB — PROTEIN ELECTROPHORESIS, SERUM
Albumin ELP: 4 g/dL (ref 3.8–4.8)
Alpha 1: 0.3 g/dL (ref 0.2–0.3)
Alpha 2: 0.7 g/dL (ref 0.5–0.9)
Beta 2: 0.2 g/dL (ref 0.2–0.5)
Beta Globulin: 0.4 g/dL (ref 0.4–0.6)
Gamma Globulin: 0.6 g/dL — ABNORMAL LOW (ref 0.8–1.7)
Total Protein: 6.2 g/dL (ref 6.1–8.1)

## 2022-02-26 LAB — IFE AND PE, RANDOM URINE
% BETA, Urine: 26.7 %
ALBUMIN, U: 38.4 %
ALPHA 1 URINE: 8.4 %
ALPHA-2-GLOBULIN, U: 18.5 %
GAMMA GLOBULIN URINE: 8 %
Protein, Ur: 12.6 mg/dL

## 2022-02-28 DIAGNOSIS — M816 Localized osteoporosis [Lequesne]: Secondary | ICD-10-CM | POA: Diagnosis not present

## 2022-02-28 DIAGNOSIS — M8580 Other specified disorders of bone density and structure, unspecified site: Secondary | ICD-10-CM | POA: Diagnosis not present

## 2022-02-28 DIAGNOSIS — M8589 Other specified disorders of bone density and structure, multiple sites: Secondary | ICD-10-CM | POA: Diagnosis not present

## 2022-02-28 LAB — HM DEXA SCAN: HM Dexa Scan: -2.4

## 2022-03-01 ENCOUNTER — Encounter: Payer: Self-pay | Admitting: Internal Medicine

## 2022-03-06 ENCOUNTER — Telehealth: Payer: Self-pay | Admitting: Internal Medicine

## 2022-03-06 NOTE — Telephone Encounter (Signed)
Lft pt vm on home number cell vm is full. Thanks

## 2022-03-07 DIAGNOSIS — F411 Generalized anxiety disorder: Secondary | ICD-10-CM | POA: Diagnosis not present

## 2022-03-07 DIAGNOSIS — F331 Major depressive disorder, recurrent, moderate: Secondary | ICD-10-CM | POA: Diagnosis not present

## 2022-03-07 DIAGNOSIS — F5105 Insomnia due to other mental disorder: Secondary | ICD-10-CM | POA: Diagnosis not present

## 2022-03-08 ENCOUNTER — Telehealth: Payer: Self-pay | Admitting: Internal Medicine

## 2022-03-08 NOTE — Telephone Encounter (Signed)
Lft pt vm on home number cell vm was full. thanks

## 2022-03-10 ENCOUNTER — Encounter: Payer: Self-pay | Admitting: Internal Medicine

## 2022-03-10 DIAGNOSIS — N1831 Chronic kidney disease, stage 3a: Secondary | ICD-10-CM

## 2022-03-12 ENCOUNTER — Other Ambulatory Visit (INDEPENDENT_AMBULATORY_CARE_PROVIDER_SITE_OTHER): Payer: BC Managed Care – PPO

## 2022-03-12 DIAGNOSIS — D649 Anemia, unspecified: Secondary | ICD-10-CM | POA: Diagnosis not present

## 2022-03-12 NOTE — Telephone Encounter (Signed)
noted 

## 2022-03-13 LAB — IBC + FERRITIN
Ferritin: 68.9 ng/mL (ref 10.0–291.0)
Iron: 94 ug/dL (ref 42–145)
Saturation Ratios: 30.2 % (ref 20.0–50.0)
TIBC: 310.8 ug/dL (ref 250.0–450.0)
Transferrin: 222 mg/dL (ref 212.0–360.0)

## 2022-03-13 LAB — B12 AND FOLATE PANEL
Folate: >24.2 ng/mL (ref >5.9)
Vitamin B-12: 768 pg/mL (ref 211–911)

## 2022-03-22 ENCOUNTER — Ambulatory Visit
Admission: RE | Admit: 2022-03-22 | Discharge: 2022-03-22 | Disposition: A | Discharge status: Home / Self Care | Payer: BC Managed Care – PPO | Source: Ambulatory Visit | Attending: Internal Medicine | Admitting: Internal Medicine

## 2022-03-22 DIAGNOSIS — N281 Cyst of kidney, acquired: Secondary | ICD-10-CM | POA: Diagnosis not present

## 2022-03-22 DIAGNOSIS — N1831 Chronic kidney disease, stage 3a: Secondary | ICD-10-CM | POA: Insufficient documentation

## 2022-03-23 NOTE — Assessment & Plan Note (Addendum)
Renal ultrasound has been done.

## 2022-03-28 ENCOUNTER — Telehealth: Payer: Self-pay | Admitting: *Deleted

## 2022-03-28 NOTE — Telephone Encounter (Signed)
Prolia inj scheduled on 04/26/22  $0 Due & PA approved Eff: 03/07/22-03/06/23 (Cover My Meds)

## 2022-04-19 DIAGNOSIS — M542 Cervicalgia: Secondary | ICD-10-CM | POA: Diagnosis not present

## 2022-04-19 DIAGNOSIS — G43719 Chronic migraine without aura, intractable, without status migrainosus: Secondary | ICD-10-CM | POA: Diagnosis not present

## 2022-04-26 ENCOUNTER — Ambulatory Visit (INDEPENDENT_AMBULATORY_CARE_PROVIDER_SITE_OTHER): Payer: BC Managed Care – PPO

## 2022-04-26 DIAGNOSIS — M810 Age-related osteoporosis without current pathological fracture: Secondary | ICD-10-CM

## 2022-04-26 MED ORDER — DENOSUMAB 60 MG/ML ~~LOC~~ SOSY
60.0000 mg | PREFILLED_SYRINGE | Freq: Once | SUBCUTANEOUS | Status: AC
  Administered 2022-04-26: 60 mg via SUBCUTANEOUS

## 2022-04-26 NOTE — Progress Notes (Signed)
Pt presented for her Prolia SUBQ injection. Pt was identified through two identifiers. Pt tolerated shot well in the left arm.

## 2022-05-21 ENCOUNTER — Ambulatory Visit (INDEPENDENT_AMBULATORY_CARE_PROVIDER_SITE_OTHER): Payer: BC Managed Care – PPO

## 2022-05-21 ENCOUNTER — Encounter: Payer: Self-pay | Admitting: Internal Medicine

## 2022-05-21 ENCOUNTER — Ambulatory Visit: Payer: BC Managed Care – PPO | Admitting: Internal Medicine

## 2022-05-21 VITALS — BP 124/82 | HR 86 | Temp 98.4°F | Ht 65.0 in | Wt 125.0 lb

## 2022-05-21 DIAGNOSIS — R079 Chest pain, unspecified: Secondary | ICD-10-CM | POA: Diagnosis not present

## 2022-05-21 DIAGNOSIS — M79602 Pain in left arm: Secondary | ICD-10-CM

## 2022-05-21 MED ORDER — GABAPENTIN 100 MG PO CAPS: 100.0000 mg | ORAL_CAPSULE | Freq: Three times a day (TID) | ORAL | 3 refills | Status: DC

## 2022-05-21 NOTE — Patient Instructions (Signed)
I am prescribing gabapentin to manage your shoulder pain while we undergo testing and imaging to determine the cause.  The gabapentin dose can be increased as needed and tolerated. (Dizziness usually prevents an increased daytime dose)  You can combine with aleve and tylenol which may help,    I do need to send you to neurology for EMG/nerve conduction studies.  If the timeframe is delayed,  I will  order a CT or MRI of your shoulder/brachial plexus while we are waiting

## 2022-05-21 NOTE — Assessment & Plan Note (Addendum)
Shoulder exam is normal.  Pain is nonexertional and not elicited with abduction addcution,  Internal or external rotation  and has a neuropathic quality to it.   She has no neck pain or history of cervical  disk disease.  suspect brachial plexus sheath tumor.  Plain films of chest and  Shoulder ordered today.  Gabapentin prescribed for pain management

## 2022-05-21 NOTE — Progress Notes (Signed)
Subjective:  Patient ID: Lauren Tanner, female    DOB: 22-Jan-1956  Age: 66 y.o. MRN: [id number]  CC: The encounter diagnosis was Arm pain, diffuse, left.   HPI Lauren Tanner presents for evaluation of left arm pain for the last 10 to 12 days  Chief Complaint  Patient presents with   Acute Visit    Left arm pain that radiates from elbow to wrist x 10 -12 days. Does not recall injury or any heavy lifting. Pain level now is a 1.   66 yr old female with osteoporosis ,  presents with left arm pain that is positional for the last 10 to 12 days,  with no prior unusual activity or heavy lifting.  The pain is intermittent,  but progresses from minimal to severe within several minutes with supine  positioning and radiates to  her left hand in the ulnar nerve distribution , and she has felt some subjective numbess in the 4th and f5th fingers  with  no loss of strength.    History of bell's palsy  on left side 2 year ago      Outpatient Medications Prior to Visit  Medication Sig Dispense Refill   AIMOVIG 70 MG/ML SOAJ SMARTSIG:70 Milligram(s) SUB-Q Once a Month     buPROPion (WELLBUTRIN XL) 300 MG 24 hr tablet Take 300 mg by mouth daily.     clonazePAM (KLONOPIN) 0.5 MG tablet   0   denosumab (PROLIA) 60 MG/ML SOLN injection Inject 60 mg into the skin once. Administer in upper arm, thigh, or abdomen 1 Syringe 1   famotidine (PEPCID) 40 MG tablet TAKE 1 TABLET DAILY 90 tablet 3   imipramine (TOFRANIL) 50 MG tablet Take 50 mg by mouth 2 (two) times daily.     lamoTRIgine (LAMICTAL) 100 MG tablet Take 100 mg by mouth 3 (three) times daily.     naratriptan (AMERGE) 2.5 MG tablet Take 2.5 mg by mouth daily as needed.     No facility-administered medications prior to visit.    Review of Systems;  Patient denies headache, fevers, malaise, unintentional weight loss, skin rash, eye pain, sinus congestion and sinus pain, sore throat, dysphagia,  hemoptysis , cough, dyspnea, wheezing, chest pain,  palpitations, orthopnea, edema, abdominal pain, nausea, melena, diarrhea, constipation, flank pain, dysuria, hematuria, urinary  Frequency, nocturia, numbness, tingling, seizures,  Focal weakness, Loss of consciousness,  Tremor, insomnia, depression, anxiety, and suicidal ideation.      Objective:  BP 124/82 (BP Location: Left Arm, Patient Position: Sitting, Cuff Size: Normal)   Pulse 86   Temp 98.4 F (36.9 C) (Oral)   Ht 5\' 5"  (1.651 m)   Wt 125 lb (56.7 kg)   SpO2 98%   BMI 20.80 kg/m   BP Readings from Last 3 Encounters:  05/21/22 124/82  02/24/22 118/64  02/11/22 110/72    Wt Readings from Last 3 Encounters:  05/21/22 125 lb (56.7 kg)  02/24/22 127 lb (57.6 kg)  02/11/22 127 lb 12.8 oz (58 kg)    General appearance: alert, cooperative and appears stated age Ears: normal TM's and external ear canals both ears Throat: lips, mucosa, and tongue normal; teeth and gums normal Neck: no adenopathy, no carotid bruit, supple, symmetrical, trachea midline and thyroid not enlarged, symmetric, no tenderness/mass/nodules Back: symmetric, no curvature. ROM normal. No CVA tenderness. Lungs: clear to auscultation bilaterally Heart: regular rate and rhythm, S1, S2 normal, no murmur, click, rub or gallop Abdomen: soft, non-tender; bowel sounds normal; no masses,  no organomegaly Pulses: 2+ and symmetric Skin: Skin color, texture, turgor normal. No rashes or lesions Lymph nodes: Cervical, supraclavicular, and axillary nodes normal. Neuro:  awake and interactive with normal mood and affect. Higher cortical functions are normal. Speech is clear without word-finding difficulty or dysarthria. Extraocular movements are intact. Visual fields of both eyes are grossly intact. Sensation to light touch is grossly intact bilaterally of upper and lower extremities. Motor examination shows 5/5 symmetric hand grip and upper extremity and 5/5 lower extremity strength. There is no pronation or drift. Gait is  non-ataxic   No results found for: "HGBA1C"  Lab Results  Component Value Date   CREATININE 1.09 02/11/2022   CREATININE 1.20 11/21/2020   CREATININE 1.08 (H) 03/14/2020    Lab Results  Component Value Date   WBC 5.4 02/22/2022   HGB 11.5 (L) 02/22/2022   HCT 33.5 (L) 02/22/2022   PLT 285.0 02/22/2022   GLUCOSE 89 02/11/2022   CHOL 217 (H) 02/11/2022   TRIG 161.0 (H) 02/11/2022   HDL 48.60 02/11/2022   LDLDIRECT 135.9 11/17/2012   LDLCALC 136 (H) 02/11/2022   ALT 27 02/11/2022   AST 23 02/11/2022   NA 139 02/11/2022   K 4.2 02/11/2022   CL 103 02/11/2022   CREATININE 1.09 02/11/2022   BUN 19 02/11/2022   CO2 29 02/11/2022   TSH 2.89 02/11/2022   INR 1.0 03/14/2020   MICROALBUR 1.1 02/22/2022    US Renal  Result Date: 03/25/2022 CLINICAL DATA:  Stage III A chronic renal disease. EXAM: RENAL / URINARY TRACT ULTRASOUND COMPLETE COMPARISON:  None Available. FINDINGS: Right Kidney: Renal measurements: 9.2 cm x 4.7 cm x 5.0 cm = volume: 112.2 mL. Echogenicity within normal limits. A 2.2 cm x 1.7 cm x 1.3 cm simple cyst is seen within the lower pole of the right kidney. No hydronephrosis is visualized. Left Kidney: Renal measurements: 9.1 cm x 5.5 cm x 4.5 cm = volume: 117.1 mL. Echogenicity within normal limits. No mass or hydronephrosis visualized. Bladder: Appears normal for degree of bladder distention. The bilateral ureteral jets are visualized. Other: None. IMPRESSION: Simple right renal cyst. Electronically Signed   By: Virgina Norfolk M.D.   On: 03/25/2022 03:29    Assessment & Plan:   Problem List Items Addressed This Visit     Arm pain, diffuse, left - Primary    Shoulder exam is normal.  Pain is nonexertional and not elicited with abduction addcution,  Internal or external rotation  and has a neuropathic quality to it.   She has no neck pain or history of cervical  disk disease.  suspect brachial plexus sheath tumor.  Plain films of chest and  Shoulder ordered  today.  Gabapentin prescribed for pain management       Relevant Medications   gabapentin (NEURONTIN) 100 MG capsule   Other Relevant Orders   DG Chest 2 View   DG Shoulder Left     Follow-up: Return in about 4 weeks (around 06/18/2022).   Crecencio Mc, MD

## 2022-05-23 ENCOUNTER — Telehealth: Payer: Self-pay | Admitting: Internal Medicine

## 2022-05-23 NOTE — Telephone Encounter (Signed)
Lft pt vm to call ofc . thanks 

## 2022-05-24 ENCOUNTER — Telehealth: Payer: Self-pay | Admitting: Internal Medicine

## 2022-05-24 NOTE — Telephone Encounter (Signed)
Lft pt vm to call ofc . thanks 

## 2022-05-27 ENCOUNTER — Telehealth: Payer: Self-pay | Admitting: Internal Medicine

## 2022-05-27 NOTE — Telephone Encounter (Signed)
Lft pt vm to call ofc and sent mychart to sch CT. thanks

## 2022-06-06 ENCOUNTER — Ambulatory Visit
Admission: RE | Admit: 2022-06-06 | Discharge: 2022-06-06 | Disposition: A | Discharge status: Home / Self Care | Payer: BC Managed Care – PPO | Source: Ambulatory Visit | Attending: Internal Medicine | Admitting: Internal Medicine

## 2022-06-06 DIAGNOSIS — M79602 Pain in left arm: Secondary | ICD-10-CM | POA: Diagnosis not present

## 2022-06-06 DIAGNOSIS — G540 Brachial plexus disorders: Secondary | ICD-10-CM | POA: Diagnosis not present

## 2022-06-06 LAB — POCT I-STAT CREATININE: Creatinine, Ser: 1 mg/dL (ref 0.44–1.00)

## 2022-06-06 MED ORDER — IOHEXOL 300 MG/ML  SOLN
75.0000 mL | Freq: Once | INTRAMUSCULAR | Status: AC | PRN
  Administered 2022-06-06: 75 mL via INTRAVENOUS

## 2022-06-07 DIAGNOSIS — F331 Major depressive disorder, recurrent, moderate: Secondary | ICD-10-CM | POA: Diagnosis not present

## 2022-06-07 DIAGNOSIS — Z23 Encounter for immunization: Secondary | ICD-10-CM | POA: Diagnosis not present

## 2022-06-07 DIAGNOSIS — F411 Generalized anxiety disorder: Secondary | ICD-10-CM | POA: Diagnosis not present

## 2022-06-14 ENCOUNTER — Encounter: Payer: Self-pay | Admitting: Internal Medicine

## 2022-06-14 DIAGNOSIS — M5010 Cervical disc disorder with radiculopathy, unspecified cervical region: Secondary | ICD-10-CM

## 2022-06-18 DIAGNOSIS — Z880 Allergy status to penicillin: Secondary | ICD-10-CM | POA: Diagnosis not present

## 2022-06-18 DIAGNOSIS — W19XXXA Unspecified fall, initial encounter: Secondary | ICD-10-CM | POA: Diagnosis not present

## 2022-06-18 DIAGNOSIS — J942 Hemothorax: Secondary | ICD-10-CM | POA: Diagnosis not present

## 2022-06-18 DIAGNOSIS — M810 Age-related osteoporosis without current pathological fracture: Secondary | ICD-10-CM | POA: Diagnosis not present

## 2022-06-18 DIAGNOSIS — D171 Benign lipomatous neoplasm of skin and subcutaneous tissue of trunk: Secondary | ICD-10-CM | POA: Diagnosis not present

## 2022-06-18 DIAGNOSIS — K219 Gastro-esophageal reflux disease without esophagitis: Secondary | ICD-10-CM | POA: Diagnosis not present

## 2022-06-18 DIAGNOSIS — S3993XA Unspecified injury of pelvis, initial encounter: Secondary | ICD-10-CM | POA: Diagnosis not present

## 2022-06-18 DIAGNOSIS — Y929 Unspecified place or not applicable: Secondary | ICD-10-CM | POA: Diagnosis not present

## 2022-06-18 DIAGNOSIS — F32A Depression, unspecified: Secondary | ICD-10-CM | POA: Diagnosis not present

## 2022-06-18 DIAGNOSIS — S2242XA Multiple fractures of ribs, left side, initial encounter for closed fracture: Secondary | ICD-10-CM | POA: Diagnosis not present

## 2022-06-18 DIAGNOSIS — S199XXA Unspecified injury of neck, initial encounter: Secondary | ICD-10-CM | POA: Diagnosis not present

## 2022-06-18 DIAGNOSIS — Z4682 Encounter for fitting and adjustment of non-vascular catheter: Secondary | ICD-10-CM | POA: Diagnosis not present

## 2022-06-18 DIAGNOSIS — S271XXA Traumatic hemothorax, initial encounter: Secondary | ICD-10-CM | POA: Diagnosis not present

## 2022-06-18 DIAGNOSIS — E785 Hyperlipidemia, unspecified: Secondary | ICD-10-CM | POA: Diagnosis not present

## 2022-06-18 DIAGNOSIS — Z79899 Other long term (current) drug therapy: Secondary | ICD-10-CM | POA: Diagnosis not present

## 2022-06-18 DIAGNOSIS — S2249XA Multiple fractures of ribs, unspecified side, initial encounter for closed fracture: Secondary | ICD-10-CM | POA: Diagnosis not present

## 2022-06-18 DIAGNOSIS — Z4889 Encounter for other specified surgical aftercare: Secondary | ICD-10-CM | POA: Diagnosis not present

## 2022-06-18 DIAGNOSIS — S2239XA Fracture of one rib, unspecified side, initial encounter for closed fracture: Secondary | ICD-10-CM | POA: Diagnosis not present

## 2022-06-18 DIAGNOSIS — N183 Chronic kidney disease, stage 3 unspecified: Secondary | ICD-10-CM | POA: Diagnosis not present

## 2022-06-18 DIAGNOSIS — S0990XA Unspecified injury of head, initial encounter: Secondary | ICD-10-CM | POA: Diagnosis not present

## 2022-06-18 DIAGNOSIS — S3991XA Unspecified injury of abdomen, initial encounter: Secondary | ICD-10-CM | POA: Diagnosis not present

## 2022-06-18 DIAGNOSIS — S3992XA Unspecified injury of lower back, initial encounter: Secondary | ICD-10-CM | POA: Diagnosis not present

## 2022-06-18 DIAGNOSIS — Y9301 Activity, walking, marching and hiking: Secondary | ICD-10-CM | POA: Diagnosis not present

## 2022-06-18 DIAGNOSIS — S2242XK Multiple fractures of ribs, left side, subsequent encounter for fracture with nonunion: Secondary | ICD-10-CM | POA: Diagnosis not present

## 2022-06-18 DIAGNOSIS — J9811 Atelectasis: Secondary | ICD-10-CM | POA: Diagnosis not present

## 2022-06-18 DIAGNOSIS — Z882 Allergy status to sulfonamides status: Secondary | ICD-10-CM | POA: Diagnosis not present

## 2022-06-18 DIAGNOSIS — W01190A Fall on same level from slipping, tripping and stumbling with subsequent striking against furniture, initial encounter: Secondary | ICD-10-CM | POA: Diagnosis not present

## 2022-06-18 NOTE — Assessment & Plan Note (Addendum)
Neurologic exam is normal today , BUT MRI OF NECK TO RULE OUT BRACHIAL PLEXUS TUMOR NOTES Severe cervical spine degeneration. C1-2 facet arthritis is especially advanced towards the left with joint space collapse, sclerosis, and ridging impinging on the left C2 nerve root. Facet and disc degeneration is generalized, left foraminal impingement at C5-6 and C6-7 primarily due to uncovertebral spurs.

## 2022-06-26 DIAGNOSIS — M542 Cervicalgia: Secondary | ICD-10-CM | POA: Diagnosis not present

## 2022-06-26 DIAGNOSIS — G43719 Chronic migraine without aura, intractable, without status migrainosus: Secondary | ICD-10-CM | POA: Diagnosis not present

## 2022-07-02 DIAGNOSIS — S2242XA Multiple fractures of ribs, left side, initial encounter for closed fracture: Secondary | ICD-10-CM | POA: Diagnosis not present

## 2022-07-10 NOTE — Telephone Encounter (Signed)
MyChart messgae sent to patient. 

## 2022-08-04 ENCOUNTER — Encounter: Payer: Self-pay | Admitting: Internal Medicine

## 2022-08-05 NOTE — Telephone Encounter (Signed)
Pt was seen in October for this. Would you like for her to schedule a follow up appt with you?

## 2022-08-06 ENCOUNTER — Encounter: Payer: Self-pay | Admitting: Internal Medicine

## 2022-08-06 DIAGNOSIS — M79602 Pain in left arm: Secondary | ICD-10-CM

## 2022-08-06 DIAGNOSIS — M5010 Cervical disc disorder with radiculopathy, unspecified cervical region: Secondary | ICD-10-CM

## 2022-08-27 DIAGNOSIS — M542 Cervicalgia: Secondary | ICD-10-CM | POA: Diagnosis not present

## 2022-08-27 DIAGNOSIS — G43719 Chronic migraine without aura, intractable, without status migrainosus: Secondary | ICD-10-CM | POA: Diagnosis not present

## 2022-08-30 DIAGNOSIS — F411 Generalized anxiety disorder: Secondary | ICD-10-CM | POA: Diagnosis not present

## 2022-08-30 DIAGNOSIS — F331 Major depressive disorder, recurrent, moderate: Secondary | ICD-10-CM | POA: Diagnosis not present

## 2022-09-05 ENCOUNTER — Ambulatory Visit: Payer: BC Managed Care – PPO | Admitting: Physical Therapy

## 2022-09-05 ENCOUNTER — Telehealth: Payer: Self-pay | Admitting: *Deleted

## 2022-09-05 NOTE — Telephone Encounter (Signed)
$  0 due for Prolia; PA on file.  Left voicemail & sent mychart message for pt to schedule next Prolia. Pt due on or after 10/25/22.

## 2022-09-13 NOTE — Telephone Encounter (Signed)
noted 

## 2022-09-13 NOTE — Telephone Encounter (Signed)
Pt called stating she has not heard from physical therapy yet

## 2022-09-13 NOTE — Telephone Encounter (Signed)
Pt has not heard from physical therapy referral that was placed on 08/06/2022.

## 2022-09-23 NOTE — Telephone Encounter (Signed)
Left voicemail for pt to call back and schedule Prolia on or after 10/25/22  ($0 due)

## 2022-10-07 ENCOUNTER — Telehealth: Payer: Self-pay | Admitting: *Deleted

## 2022-10-07 NOTE — Telephone Encounter (Signed)
Left a voicemail to schedule Prolia. This is my third attempt to reach patient. I will mail a letter & no further attempts will be made  $0 due; PA on file. Pt due for Prolia on or after 10/25/22

## 2022-10-09 ENCOUNTER — Encounter: Payer: Self-pay | Admitting: Internal Medicine

## 2022-10-09 DIAGNOSIS — M79602 Pain in left arm: Secondary | ICD-10-CM

## 2022-10-10 ENCOUNTER — Ambulatory Visit: Payer: BC Managed Care – PPO

## 2022-10-11 MED ORDER — GABAPENTIN 100 MG PO CAPS: 200.0000 mg | ORAL_CAPSULE | Freq: Three times a day (TID) | ORAL | 3 refills | Status: DC | PRN

## 2022-10-16 ENCOUNTER — Ambulatory Visit: Payer: BC Managed Care – PPO | Attending: Internal Medicine | Admitting: Physical Therapy

## 2022-10-16 DIAGNOSIS — M5010 Cervical disc disorder with radiculopathy, unspecified cervical region: Secondary | ICD-10-CM | POA: Insufficient documentation

## 2022-10-16 DIAGNOSIS — M79602 Pain in left arm: Secondary | ICD-10-CM | POA: Diagnosis not present

## 2022-10-16 DIAGNOSIS — M25512 Pain in left shoulder: Secondary | ICD-10-CM | POA: Insufficient documentation

## 2022-10-16 DIAGNOSIS — G8929 Other chronic pain: Secondary | ICD-10-CM | POA: Insufficient documentation

## 2022-10-16 NOTE — Therapy (Signed)
OUTPATIENT PHYSICAL THERAPY SHOULDER EVALUATION   Patient Name: Lauren Tanner MRN: [id number] DOB:1955-12-12, 67 y.o., female Today's Date: 10/17/2022  END OF SESSION:  PT End of Session - 10/16/22 1523     Visit Number 1    Number of Visits 17    Date for PT Re-Evaluation 12/27/22    Authorization - Visit Number 1    Authorization - Number of Visits 10    Progress Note Due on Visit 10    PT Start Time 1519    PT Stop Time 1600    PT Time Calculation (min) 41 min    Activity Tolerance Patient tolerated treatment well    Behavior During Therapy [facility] for tasks assessed/performed             Past Medical History:  Diagnosis Date   Chronic interstitial cystitis 10/11/2011   Depression    no history of hospitalizations   Diverticulosis    Dysrhythmia    GERD (gastroesophageal reflux disease)    Headache disorder    managed with imipramine   History of colonic polyps 2011   last colonoscopy, [place] [facility]   SVT (supraventricular tachycardia)    normal screening ER workup   Past Surgical History:  Procedure Laterality Date   COLONOSCOPY WITH PROPOFOL N/A 05/19/2015   Procedure: COLONOSCOPY WITH PROPOFOL;  Surgeon: Manya Silvas, MD;  Location: [place];  Service: Endoscopy;  Laterality: N/A;   COLONOSCOPY WITH PROPOFOL N/A 12/20/2019   Procedure: COLONOSCOPY WITH PROPOFOL;  Surgeon: Virgel Manifold, MD;  Location: [facility] ENDOSCOPY;  Service: Endoscopy;  Laterality: N/A;   COLONOSCOPY WITH PROPOFOL N/A 07/11/2020   Procedure: COLONOSCOPY WITH PROPOFOL;  Surgeon: Jonathon Bellows, MD;  Location: [facility] ENDOSCOPY;  Service: Endoscopy;  Laterality: N/A;   DILATION AND CURETTAGE OF UTERUS     Patient Active Problem List   Diagnosis Date Noted   Arm pain, diffuse, left 05/21/2022   Anemia 02/26/2022   Stage 3a chronic kidney disease ([place]) 02/22/2022   Bilateral bunions 11/22/2020   Bell's palsy 03/22/2020   Hyperlipidemia LDL goal <160 10/24/2019   Cervical cancer  screening 10/24/2019   Cervical disc disorder with radiculopathy of cervical region 03/14/2016   Encounter for preventive health examination 11/17/2012   Postmenopausal atrophic vaginitis 11/17/2012   GERD (gastroesophageal reflux disease)    Family history of colon cancer requiring screening colonoscopy    Osteoporosis, post-menopausal 10/11/2011   Uterine polyp 10/11/2011   Depression, major, in partial remission ([place]) 10/11/2011   Migraine headache 10/11/2011   Diverticulosis     PCP: Derrel Nip MD  REFERRING PROVIDER: Derrel Nip MD  REFERRING DIAG: L shoulder pain   THERAPY DIAG:  Chronic left shoulder pain  Rationale for Evaluation and Treatment: Rehabilitation  ONSET DATE: Nov 2023  SUBJECTIVE:  SUBJECTIVE STATEMENT: LUE pain  Hand dominance: Right  PERTINENT HISTORY: Pt is a 67 year old female presenting with L shoulder pain with numbness in the finger tips, and electrical sensation from the inside her her arm beginning in her armpit down to the 3rd/4th/5th fingers. She has electrical sensations and finger tip numbness when she lays down to go to sleep at night, and no other time. Reports this pain wakes her from her sleep 80% of nights 1-2x/night. Reports pain may be worse with a lot of pulling activity "when I was pulling a hose once it worsened". Pain is worsened by sleeping on L side which is her typical sleeping position. 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 Occupation not inhibited by shoulder pain. Current and best pain 1/10; worst 10/10- pain is burning, electric; alleviated by repositioning. Denies neck or head pain, or headaches. Pt denies N/V, B&B changes, unexplained weight fluctuation, saddle paresthesia, fever, night sweats, or unrelenting night pain at this  time.   PAIN:  Are you having pain? Yes: NPRS scale: 1/10 Pain location: armpit to 3rd, 4th, 5th digit Pain description: burning/electric; short lasting Aggravating factors: sleeping (particularly on L side) Relieving factors: Re-positioning  PRECAUTIONS: None  WEIGHT BEARING RESTRICTIONS: No  FALLS:  Has patient fallen in last 6 months? 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  No abnormality seen along the brachial plexus. No apical lung mass. 2. Advanced cervical spine degeneration with left foraminal impingement at C5-6 and C6-7.  PATIENT SURVEYS:  FOTO 44 goal of 75  COGNITION: Overall cognitive status: Within functional limits for tasks assessed     SENSATION: WFL  POSTURE: FHRS, upper crossed Scapular dyskinesis with overhead mobility   UPPER EXTREMITY ROM:   Active ROM Right eval Left eval  Shoulder flexion WNL WNL  Shoulder extension WNL WNL  Shoulder abduction WNL WNL  Shoulder adduction    Shoulder internal rotation WNL/Apleys T10 WNL/Apleys T10  Shoulder external rotation WNL/Apleys CTJ WNL/Apleys CTJ  Elbow flexion WNL wnl  Elbow extension wnl wnl  Wrist flexion    Wrist extension    Wrist ulnar deviation    Wrist radial deviation    Wrist pronation    Wrist supination    (Blank rows = not tested)  Cervical:  Rotation R: 51 L 59 Lateral Flex R: 31 L 21 Ext and flex WNL  UPPER EXTREMITY MMT:  MMT Right eval Left eval  Shoulder flexion 5 5  Shoulder extension 5 5  Shoulder abduction 5 5  Shoulder adduction    Shoulder  internal rotation 5 5  Shoulder external rotation 5 5  Middle trapezius 4+ 4+  Lower trapezius 4 4-  Elbow flexion    Elbow extension    Wrist flexion    Wrist extension    Wrist ulnar deviation    Wrist radial deviation    Wrist pronation    Wrist supination    Grip strength (lbs)    (Blank rows = not tested)  SHOULDER SPECIAL TESTS: Impingement tests: Neer impingement test: negative, Hawkins/Kennedy impingement  test: negative, Painful arc test: negative, and O' Briens test: negative  Rotator cuff assessment: Drop arm test: negative, Empty can test: negative, Full can test: negative, External rotation lag sign: negative, Internal rotation lag sign: negative, Gerber lift off test: negative, Belly press test: negative, and Infraspinatus test: negative  TOS Ellsworth Lennox and Joya Gaskins all negative  Beighton 3/9 (palm to floor and bilat elbows)  CERVICAL SPECIAL TESTS:  Upper limb tension test (ULTT): Positive, Spurling's test: Negative, and Distraction test: Negative  JOINT MOBILITY TESTING:  First rib: WNL GHJ WNL all directions Scapular mobility WNL all directions with excessive protraction  Upper thoracic spine hypomobile throughout  PALPATION:  Tension with trigger points, pain to deep palpation of    TODAY'S TREATMENT:                                                                                                                                         DATE: 10/16/22 PT reviewed the following HEP with patient with patient able to demonstrate a set of the following with min cuing for correction needed. PT educated patient on parameters of therex (how/when to inc/decrease intensity, frequency, rep/set range, stretch hold time, and purpose of therex) with verbalized understanding.   Access Code: [id number] - Seated Thoracic Lumbar Extension with Pectoralis Stretch  - 3-5 x daily - 7 x weekly - 12-20 reps - Seated Scapular Retraction  - 3-5 x daily - 7 x weekly - 12-20 reps - 3sec  hold - Seated Passive Cervical Retraction  - 3-5 x daily - 7 x weekly - 12-20 reps - 3sec hold - Doorway Pec Stretch at 90 Degrees Abduction  - 3 x daily - 7 x weekly - 30-60sec hold   PATIENT EDUCATION: Education details: Patient was educated on diagnosis, anatomy and pathology involved, prognosis, role of PT, and was given an HEP, demonstrating exercise with proper form following verbal and tactile cues, and was given a paper hand out to continue exercise at home. Pt was educated on and agreed to plan of care.  Person educated: Patient Education method: Explanation, Demonstration, and Handouts Education comprehension: verbalized understanding and returned demonstration  HOME EXERCISE PROGRAM: [phone]  ASSESSMENT:  CLINICAL IMPRESSION: Patient is a 67 y.o. female who was seen today for physical therapy evaluation and treatment for L shoulder pain. Patient with signs and symptoms of peripheral ulnar nerve entrapment, currently unable to provocate on exam. Patient with decreased cervical mobility, but other physical tests not positive for cervical radiculopathy; TOS cluster negative. Impairments in decreased cervical mobility, nerve pain, decreased periscapular strength, and decreased scapulohumeral rhythm. Activity limitations in pulling, carrying, sleeping, putting pressure on shoulder, and scanning environments; inhibiting participation in household ADLs. Would benefit from skilled PT to address above deficits and promote optimal return to PLOF.   OBJECTIVE IMPAIRMENTS: decreased activity tolerance, decreased endurance, decreased mobility, decreased ROM, decreased strength, increased fascial restrictions, impaired  flexibility, impaired sensation, impaired UE functional use, improper body mechanics, postural dysfunction, and pain.   ACTIVITY LIMITATIONS: carrying, lifting, sleeping, and transfers  PARTICIPATION LIMITATIONS: meal prep, cleaning, laundry, driving, shopping, community  activity, and occupation  PERSONAL FACTORS: Age, Fitness, Past/current experiences, Sex, Time since onset of injury/illness/exacerbation, and 1-2 comorbidities: osteoporosis, depression, anemia, chronic LUE pain  are also affecting patient's functional outcome.   REHAB POTENTIAL: Good  CLINICAL DECISION MAKING: Evolving/moderate complexity  EVALUATION COMPLEXITY: Moderate   GOALS: Goals reviewed with patient? Yes  SHORT TERM GOALS: Target date: 11/11/22  Pt will be independent with HEP in order to improve strength and balance in order to decrease fall risk and improve function at home and work.  Baseline: HEP given  Goal status: INITIAL    LONG TERM GOALS: Target date: 12/26/22  Patient will increase FOTO score to 68  to demonstrate predicted increase in functional mobility to complete ADLs Baseline: 44 Goal status: INITIAL  2.  Pt will decrease worst pain as reported on NPRS by at least 3 points in order to demonstrate clinically significant reduction in pain.  Baseline: 10/10 Goal status: INITIAL  3.  Pt will demonstrate cervical rotation of at least 70d bilat in order to drive safely Baseline: R: 51 L 59 Goal status: INITIAL    PLAN:  PT FREQUENCY: 1-2x/week  PT DURATION: 8 weeks  PLANNED INTERVENTIONS: Therapeutic exercises, Therapeutic activity, Neuromuscular re-education, Balance training, Gait training, Patient/Family education, Self Care, Joint mobilization, Joint manipulation, Visual/preceptual remediation/compensation, Dry Needling, Electrical stimulation, Spinal manipulation, Spinal mobilization, Cryotherapy, Moist heat, Traction, Ultrasound, Ionotophoresis 4mg /ml Dexamethasone, Manual therapy, and Re-evaluation  PLAN FOR NEXT SESSION: HEP review  Durwin Reges DPT  Durwin Reges, PT 10/17/2022, 3:52 PM

## 2022-10-23 ENCOUNTER — Ambulatory Visit: Payer: BC Managed Care – PPO | Admitting: Physical Therapy

## 2022-10-28 ENCOUNTER — Ambulatory Visit (INDEPENDENT_AMBULATORY_CARE_PROVIDER_SITE_OTHER): Payer: BC Managed Care – PPO

## 2022-10-28 DIAGNOSIS — M810 Age-related osteoporosis without current pathological fracture: Secondary | ICD-10-CM | POA: Diagnosis not present

## 2022-10-28 MED ORDER — DENOSUMAB 60 MG/ML ~~LOC~~ SOSY
60.0000 mg | PREFILLED_SYRINGE | Freq: Once | SUBCUTANEOUS | Status: AC
  Administered 2022-10-28: 60 mg via SUBCUTANEOUS

## 2022-10-28 NOTE — Progress Notes (Signed)
Pt presented for their subcutaneous Prolia injection. Pt was identified through two identifiers. Pt was given the information packets about the Prolia and told to schedule their next injection 6 months out. Pt tolerated the subq injection well in the right arm.  

## 2022-10-29 ENCOUNTER — Ambulatory Visit: Payer: BC Managed Care – PPO | Admitting: Physical Therapy

## 2022-10-31 ENCOUNTER — Ambulatory Visit: Payer: BC Managed Care – PPO | Attending: Internal Medicine | Admitting: Physical Therapy

## 2022-10-31 ENCOUNTER — Encounter: Payer: Self-pay | Admitting: Physical Therapy

## 2022-10-31 DIAGNOSIS — M542 Cervicalgia: Secondary | ICD-10-CM | POA: Diagnosis not present

## 2022-10-31 DIAGNOSIS — M25512 Pain in left shoulder: Secondary | ICD-10-CM | POA: Insufficient documentation

## 2022-10-31 DIAGNOSIS — G8929 Other chronic pain: Secondary | ICD-10-CM | POA: Diagnosis not present

## 2022-10-31 DIAGNOSIS — G43719 Chronic migraine without aura, intractable, without status migrainosus: Secondary | ICD-10-CM | POA: Diagnosis not present

## 2022-10-31 NOTE — Therapy (Signed)
OUTPATIENT PHYSICAL THERAPY SHOULDER EVALUATION   Patient Name: Lauren Tanner MRN: [id number] DOB:10-18-1955, 67 y.o., female Today's Date: 10/31/2022  END OF SESSION:  PT End of Session - 10/31/22 1524     Visit Number 2    Number of Visits 17    Date for PT Re-Evaluation 12/27/22    Authorization - Visit Number 2    Authorization - Number of Visits 10    Progress Note Due on Visit 10    PT Start Time [id number]    PT Stop Time [id number]    PT Time Calculation (min) 38 min    Activity Tolerance Patient tolerated treatment well    Behavior During Therapy [facility] for tasks assessed/performed              Past Medical History:  Diagnosis Date   Chronic interstitial cystitis 10/11/2011   Depression    no history of hospitalizations   Diverticulosis    Dysrhythmia    GERD (gastroesophageal reflux disease)    Headache disorder    managed with imipramine   History of colonic polyps 2011   last colonoscopy, [place] [facility]   SVT (supraventricular tachycardia)    normal screening ER workup   Past Surgical History:  Procedure Laterality Date   COLONOSCOPY WITH PROPOFOL N/A 05/19/2015   Procedure: COLONOSCOPY WITH PROPOFOL;  Surgeon: Manya Silvas, MD;  Location: [place];  Service: Endoscopy;  Laterality: N/A;   COLONOSCOPY WITH PROPOFOL N/A 12/20/2019   Procedure: COLONOSCOPY WITH PROPOFOL;  Surgeon: Virgel Manifold, MD;  Location: [facility] ENDOSCOPY;  Service: Endoscopy;  Laterality: N/A;   COLONOSCOPY WITH PROPOFOL N/A 07/11/2020   Procedure: COLONOSCOPY WITH PROPOFOL;  Surgeon: Jonathon Bellows, MD;  Location: [facility] ENDOSCOPY;  Service: Endoscopy;  Laterality: N/A;   DILATION AND CURETTAGE OF UTERUS     Patient Active Problem List   Diagnosis Date Noted   Arm pain, diffuse, left 05/21/2022   Anemia 02/26/2022   Stage 3a chronic kidney disease 02/22/2022   Bilateral bunions 11/22/2020   Bell's palsy 03/22/2020   Hyperlipidemia LDL goal <160 10/24/2019   Cervical cancer  screening 10/24/2019   Cervical disc disorder with radiculopathy of cervical region 03/14/2016   Encounter for preventive health examination 11/17/2012   Postmenopausal atrophic vaginitis 11/17/2012   GERD (gastroesophageal reflux disease)    Family history of colon cancer requiring screening colonoscopy    Osteoporosis, post-menopausal 10/11/2011   Uterine polyp 10/11/2011   Depression, major, in partial remission 10/11/2011   Migraine headache 10/11/2011   Diverticulosis     PCP: Derrel Nip MD  REFERRING PROVIDER: Derrel Nip MD  REFERRING DIAG: L shoulder pain   THERAPY DIAG:  Chronic left shoulder pain  Rationale for Evaluation and Treatment: Rehabilitation  ONSET DATE: Nov 2023  SUBJECTIVE:  SUBJECTIVE STATEMENT: Pt reports her shoulder is feeling better. She reports only 1/10 pain today. She has been completing HEP, better compliance with seated exercises due to accessibility.   Hand dominance: Right  PERTINENT HISTORY: Pt is a 67 year old female presenting with L shoulder pain with numbness in the finger tips, and electrical sensation from the inside her her arm beginning in her armpit down to the 3rd/4th/5th fingers. She has electrical sensations and finger tip numbness when she lays down to go to sleep at night, and no other time. Reports this pain wakes her from her sleep 80% of nights 1-2x/night. Reports pain may be worse with a lot of pulling activity "when I was pulling a hose once it worsened". Pain is worsened by sleeping on L side which is her typical sleeping position. 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 Occupation not inhibited by shoulder pain. Current and best pain 1/10; worst 10/10- pain is burning, electric; alleviated by repositioning. Denies neck or head  pain, or headaches. Pt denies N/V, B&B changes, unexplained weight fluctuation, saddle paresthesia, fever, night sweats, or unrelenting night pain at this time.   PAIN:  Are you having pain? Yes: NPRS scale: 1/10 Pain location: armpit to 3rd, 4th, 5th digit Pain description: burning/electric; short lasting Aggravating factors: sleeping (particularly on L side) Relieving factors: Re-positioning  PRECAUTIONS: None  WEIGHT BEARING RESTRICTIONS: No  FALLS:  Has patient fallen in last 6 months? 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  No abnormality seen along the brachial plexus. No apical lung mass. 2. Advanced cervical spine degeneration with left foraminal impingement at C5-6 and C6-7.  PATIENT SURVEYS:  FOTO 44 goal of 27  COGNITION: Overall cognitive status: Within functional limits for tasks assessed     SENSATION: WFL  POSTURE: FHRS, upper crossed Scapular dyskinesis with overhead mobility   UPPER EXTREMITY ROM:   Active ROM Right eval Left eval  Shoulder flexion WNL WNL  Shoulder extension WNL WNL  Shoulder abduction WNL WNL  Shoulder adduction    Shoulder internal rotation WNL/Apleys T10 WNL/Apleys T10  Shoulder external rotation WNL/Apleys CTJ WNL/Apleys CTJ  Elbow flexion WNL wnl  Elbow extension wnl wnl  Wrist flexion    Wrist extension    Wrist ulnar deviation    Wrist radial deviation    Wrist pronation    Wrist supination    (Blank rows = not tested)  Cervical:  Rotation R: 51 L 59 Lateral Flex R: 31 L 21 Ext and flex WNL  UPPER  EXTREMITY MMT:  MMT Right eval Left eval  Shoulder flexion 5 5  Shoulder extension 5 5  Shoulder abduction 5 5  Shoulder adduction    Shoulder internal rotation 5 5  Shoulder external rotation 5 5  Middle trapezius 4+ 4+  Lower trapezius 4 4-  Elbow flexion    Elbow extension    Wrist flexion    Wrist extension    Wrist ulnar deviation    Wrist radial deviation    Wrist pronation    Wrist supination  Grip strength (lbs)    (Blank rows = not tested)  SHOULDER SPECIAL TESTS: Impingement tests: Neer impingement test: negative, Hawkins/Kennedy impingement test: negative, Painful arc test: negative, and O' Briens test: negative  Rotator cuff assessment: Drop arm test: negative, Empty can test: negative, Full can test: negative, External rotation lag sign: negative, Internal rotation lag sign: negative, Gerber lift off test: negative, Belly press test: negative, and Infraspinatus test: negative  TOS Ellsworth Lennox and Joya Gaskins all negative  Beighton 3/9 (palm to floor and bilat elbows)  CERVICAL SPECIAL TESTS:  Upper limb tension test (ULTT): Positive, Spurling's test: Negative, and Distraction test: Negative  JOINT MOBILITY TESTING:  First rib: WNL GHJ WNL all directions Scapular mobility WNL all directions with excessive protraction  Upper thoracic spine hypomobile throughout  PALPATION:  Tension with trigger points, pain to deep palpation of    TODAY'S TREATMENT:                                                                                                                                         DATE: 10/16/22 Nustep seat 8 UE 12 L3 46min for gentle protraction/retraction mobility and strength  Seated Thoracolumbar extension with pectoralis stretch x12 with min cuing for breath control  Seated scapular retraction and cervical retraction x12 with good carry over of min cuing or initial technique Open book on wall with ball between hip and wall for stabilization x12 bilat   Bilat ER GTB 2x 10 with good carry over of initial cuing for technique Standing high row GTB 2x 10 with good carry over of initial technique Doorway Pec Stretch at 90 Degrees Abduction  30sec hold Suboccipital stretch x30secH  PATIENT EDUCATION: Education details: Patient was educated on diagnosis, anatomy and pathology involved, prognosis, role of PT, and was given an HEP, demonstrating exercise with proper form following verbal and tactile cues, and was given a paper hand out to continue exercise at home. Pt was educated on and agreed to plan of care.  Person educated: Patient Education method: Explanation, Demonstration, and Handouts Education comprehension: verbalized understanding and returned demonstration  HOME EXERCISE PROGRAM: [id number]  ASSESSMENT:  CLINICAL IMPRESSION: PT initiated therex progression for increased periscapular strength, and optimal length/tension relationships of the upper body with success. Pt is able to comply with all cuing for proper technique of therex with with good motivation throughout session- no increased pain. Would benefit from skilled PT to address deficits and promote optimal return to PLOF.   OBJECTIVE IMPAIRMENTS: decreased activity tolerance, decreased endurance, decreased mobility, decreased ROM, decreased strength, increased fascial restrictions, impaired flexibility, impaired sensation, impaired UE functional use, improper body mechanics, postural dysfunction, and pain.   ACTIVITY LIMITATIONS: carrying, lifting, sleeping, and transfers  PARTICIPATION LIMITATIONS: meal prep, cleaning, laundry, driving, shopping, community activity, and occupation  PERSONAL FACTORS: Age, Fitness, Past/current experiences, Sex, Time since onset of injury/illness/exacerbation, and 1-2 comorbidities: osteoporosis, depression, anemia,  chronic LUE pain  are also affecting patient's functional outcome.   REHAB POTENTIAL: Good  CLINICAL DECISION MAKING:  Evolving/moderate complexity  EVALUATION COMPLEXITY: Moderate   GOALS: Goals reviewed with patient? Yes  SHORT TERM GOALS: Target date: 11/11/22  Pt will be independent with HEP in order to improve strength and balance in order to decrease fall risk and improve function at home and work.  Baseline: HEP given  Goal status: INITIAL    LONG TERM GOALS: Target date: 12/26/22  Patient will increase FOTO score to 68  to demonstrate predicted increase in functional mobility to complete ADLs Baseline: 44 Goal status: INITIAL  2.  Pt will decrease worst pain as reported on NPRS by at least 3 points in order to demonstrate clinically significant reduction in pain.  Baseline: 10/10 Goal status: INITIAL  3.  Pt will demonstrate cervical rotation of at least 70d bilat in order to drive safely Baseline: R: 51 L 59 Goal status: INITIAL    PLAN:  PT FREQUENCY: 1-2x/week  PT DURATION: 8 weeks  PLANNED INTERVENTIONS: Therapeutic exercises, Therapeutic activity, Neuromuscular re-education, Balance training, Gait training, Patient/Family education, Self Care, Joint mobilization, Joint manipulation, Visual/preceptual remediation/compensation, Dry Needling, Electrical stimulation, Spinal manipulation, Spinal mobilization, Cryotherapy, Moist heat, Traction, Ultrasound, Ionotophoresis 4mg /ml Dexamethasone, Manual therapy, and Re-evaluation  PLAN FOR NEXT SESSION: HEP review  Durwin Reges DPT  Durwin Reges, PT 10/31/2022, 3:56 PM

## 2022-11-05 ENCOUNTER — Encounter: Payer: BC Managed Care – PPO | Admitting: Physical Therapy

## 2022-11-11 ENCOUNTER — Ambulatory Visit: Payer: BC Managed Care – PPO

## 2022-11-11 DIAGNOSIS — M25512 Pain in left shoulder: Secondary | ICD-10-CM | POA: Diagnosis not present

## 2022-11-11 DIAGNOSIS — G8929 Other chronic pain: Secondary | ICD-10-CM

## 2022-11-11 NOTE — Therapy (Signed)
OUTPATIENT PHYSICAL THERAPY SHOULDER EVALUATION   Patient Name: Lauren Tanner MRN: [id number] DOB:05-Jul-1956, 67 y.o., female Today's Date: 11/11/2022  END OF SESSION:  PT End of Session - 11/11/22 1434     Visit Number 3    Number of Visits 17    Date for PT Re-Evaluation 12/27/22    Authorization - Visit Number 3    Authorization - Number of Visits 10    Progress Note Due on Visit 10    PT Start Time 1434    PT Stop Time 1515    PT Time Calculation (min) 41 min    Activity Tolerance Patient tolerated treatment well    Behavior During Therapy [facility] for tasks assessed/performed              Past Medical History:  Diagnosis Date   Chronic interstitial cystitis 10/11/2011   Depression    no history of hospitalizations   Diverticulosis    Dysrhythmia    GERD (gastroesophageal reflux disease)    Headache disorder    managed with imipramine   History of colonic polyps 2011   last colonoscopy, [facility]   SVT (supraventricular tachycardia)    normal screening ER workup   Past Surgical History:  Procedure Laterality Date   COLONOSCOPY WITH PROPOFOL N/A 05/19/2015   Procedure: COLONOSCOPY WITH PROPOFOL;  Surgeon: Scot Jun, MD;  Location: [facility] ENDOSCOPY;  Service: Endoscopy;  Laterality: N/A;   COLONOSCOPY WITH PROPOFOL N/A 12/20/2019   Procedure: COLONOSCOPY WITH PROPOFOL;  Surgeon: Pasty Spillers, MD;  Location: [facility] ENDOSCOPY;  Service: Endoscopy;  Laterality: N/A;   COLONOSCOPY WITH PROPOFOL N/A 07/11/2020   Procedure: COLONOSCOPY WITH PROPOFOL;  Surgeon: Wyline Mood, MD;  Location: [facility] ENDOSCOPY;  Service: Endoscopy;  Laterality: N/A;   DILATION AND CURETTAGE OF UTERUS     Patient Active Problem List   Diagnosis Date Noted   Arm pain, diffuse, left 05/21/2022   Anemia 02/26/2022   Stage 3a chronic kidney disease 02/22/2022   Bilateral bunions 11/22/2020   Bell's palsy 03/22/2020   Hyperlipidemia LDL goal <160 10/24/2019   Cervical cancer  screening 10/24/2019   Cervical disc disorder with radiculopathy of cervical region 03/14/2016   Encounter for preventive health examination 11/17/2012   Postmenopausal atrophic vaginitis 11/17/2012   GERD (gastroesophageal reflux disease)    Family history of colon cancer requiring screening colonoscopy    Osteoporosis, post-menopausal 10/11/2011   Uterine polyp 10/11/2011   Depression, major, in partial remission 10/11/2011   Migraine headache 10/11/2011   Diverticulosis     PCP: Darrick Huntsman MD  REFERRING PROVIDER: Darrick Huntsman MD  REFERRING DIAG: L shoulder pain   THERAPY DIAG:  Chronic left shoulder pain  Rationale for Evaluation and Treatment: Rehabilitation  ONSET DATE: Nov 2023  SUBJECTIVE:  SUBJECTIVE STATEMENT:  Pt reports she has been babysitting her daughter's friends kitten over the weekend.  Pt notes no pain upon arrival, but noted that she was having increased pain in the shoulder last night, but didn't wake up fully to notice it.  Hand dominance: Right  PERTINENT HISTORY: Pt is a 67 year old female presenting presenting with L shoulder pain with numbness in the finger tips, and electrical sensation from the inside her her arm beginning in her armpit down to the 3rd/4th/5th fingers. She has electrical sensations and finger tip numbness when she lays down to go to sleep at night, and no other time. Reports this pain wakes her from her sleep 80% of nights 1-2x/night. Reports pain may be worse with a lot of pulling activity "when I was pulling a hose once it worsened". Pain is worsened by sleeping on L side which is her typical sleeping position. 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 Occupation not inhibited by shoulder pain. Current and best pain 1/10; worst 10/10- pain is burning,  electric; alleviated by repositioning. Denies neck or head pain, or headaches. Pt denies N/V, B&B changes, unexplained weight fluctuation, saddle paresthesia, fever, night sweats, or unrelenting night pain at this time.   PAIN:  Are you having pain? Yes: NPRS scale: 0/10 Pain location: armpit to 3rd, 4th, 5th digit Pain description: burning/electric; short lasting Aggravating factors: sleeping (particularly on L side) Relieving factors: Re-positioning  PRECAUTIONS: None  WEIGHT BEARING RESTRICTIONS: No  FALLS:  Has patient fallen in last 6 months? Yes; walking in dark unfamiliar house, slipped on floor  LIVING ENVIRONMENT: Lives with: lives with their family Lives in: House/apartment Stairs: Yes: Internal: 4 steps; bilateral but cannot reach both and External: 12 steps; on right going up Has following equipment at home: None  OCCUPATION: Teacher at [employer]   PLOF: Independent  PATIENT GOALS:decrease pain to sleep   NEXT MD VISIT: none yet  OBJECTIVE:   DIAGNOSTIC FINDINGS:   CT neck: 1.  No abnormality seen along the brachial plexus. No apical lung mass. 2. Advanced cervical spine degeneration with left foraminal impingement at C5-6 and C6-7.  PATIENT SURVEYS:  FOTO 44 goal of 56  COGNITION: Overall cognitive status: Within functional limits for tasks assessed     SENSATION: WFL  POSTURE: FHRS, upper crossed Scapular dyskinesis with overhead mobility   UPPER EXTREMITY ROM:   Active ROM Right eval Left eval  Shoulder flexion WNL WNL  Shoulder extension WNL WNL  Shoulder abduction WNL WNL  Shoulder adduction    Shoulder internal rotation WNL/Apleys T10 WNL/Apleys T10  Shoulder external rotation WNL/Apleys CTJ WNL/Apleys CTJ  Elbow flexion WNL wnl  Elbow extension wnl wnl  Wrist flexion    Wrist extension    Wrist ulnar deviation    Wrist radial deviation    Wrist pronation    Wrist supination    (Blank rows = not tested)  Cervical:  Rotation R: 51  L 59 Lateral Flex R: 31 L 21 Ext and flex WNL  UPPER EXTREMITY MMT:  MMT Right eval Left eval  Shoulder flexion 5 5  Shoulder extension 5 5  Shoulder abduction 5 5  Shoulder adduction    Shoulder internal rotation 5 5  Shoulder external rotation 5 5  Middle trapezius 4+ 4+  Lower trapezius 4 4-  Elbow flexion    Elbow extension    Wrist flexion    Wrist extension    Wrist ulnar deviation  Wrist radial deviation    Wrist pronation    Wrist supination    Grip strength (lbs)    (Blank rows = not tested)  SHOULDER SPECIAL TESTS: Impingement tests: Neer impingement test: negative, Hawkins/Kennedy impingement test: negative, Painful arc test: negative, and O' Briens test: negative  Rotator cuff assessment: Drop arm test: negative, Empty can test: negative, Full can test: negative, External rotation lag sign: negative, Internal rotation lag sign: negative, Gerber lift off test: negative, Belly press test: negative, and Infraspinatus test: negative  TOS Rhett Bannister and Delford Field all negative  Beighton 3/9 (palm to floor and bilat elbows)  CERVICAL SPECIAL TESTS:  Upper limb tension test (ULTT): Positive, Spurling's test: Negative, and Distraction test: Negative  JOINT MOBILITY TESTING:  First rib: WNL GHJ WNL all directions Scapular mobility WNL all directions with excessive protraction  Upper thoracic spine hypomobile throughout  PALPATION:  Tension with trigger points, pain to deep palpation of    TODAY'S TREATMENT: DATE: 11/11/22  TherEx:  Nustep seat 8 UE 12 L3 6 min for gentle protraction/retraction mobility and strength Seated thoracolumbar extension with pectoralis stretch 2x12 with min cuing for breath control  Seated GTB resisted scapular retraction and cervical retraction 2x12 with good carry over of min cuing following initial technique   Manual:  Supine STM to cervical region to increase extensibility of the paraspinals Supine suboccipital release  technique to decrease cervicalgia Supine manual traction performed in order to increase joint space in cervical region for pain relief Supine cervical upglides/downglides, 30 sec bouts Supine UT/Levator stretch, 30 sec bouts to increase tissue extensibility of the cervical region Supine STM to UT's for increased tissue extensibility     PATIENT EDUCATION: Education details: Patient was educated on diagnosis, anatomy and pathology involved, prognosis, role of PT, and was given an HEP, demonstrating exercise with proper form following verbal and tactile cues, and was given a paper hand out to continue exercise at home. Pt was educated on and agreed to plan of care.  Person educated: Patient Education method: Explanation, Demonstration, and Handouts Education comprehension: verbalized understanding and returned demonstration  HOME EXERCISE PROGRAM: [id number]  ASSESSMENT:  CLINICAL IMPRESSION:  Pt responded well to the exercises and manual therapy approach to therapy session.  Pt noted a reduction in painful symptoms upon leaving the clinic and improved ROM of the cervical region along with the shoulder.  Pt advised to continue to focus on body mechanics and postural stability at home when doing tasks as well as during occupation.   Pt will continue to benefit from skilled therapy to address remaining deficits in order to improve overall QoL and return to PLOF.      OBJECTIVE IMPAIRMENTS: decreased activity tolerance, decreased endurance, decreased mobility, decreased ROM, decreased strength, increased fascial restrictions, impaired flexibility, impaired sensation, impaired UE functional use, improper body mechanics, postural dysfunction, and pain.   ACTIVITY LIMITATIONS: carrying, lifting, sleeping, and transfers  PARTICIPATION LIMITATIONS: meal prep, cleaning, laundry, driving, shopping, community activity, and occupation  PERSONAL FACTORS: Age, Fitness, Past/current experiences, Sex,  Time since onset of injury/illness/exacerbation, and 1-2 comorbidities: osteoporosis, depression, anemia, chronic LUE pain  are also affecting patient's functional outcome.   REHAB POTENTIAL: Good  CLINICAL DECISION MAKING: Evolving/moderate complexity  EVALUATION COMPLEXITY: Moderate   GOALS: Goals reviewed with patient? Yes  SHORT TERM GOALS: Target date: 11/11/22  Pt will be independent with HEP in order to improve strength and balance in order to decrease fall risk and improve function at home and work.  Baseline: HEP given  Goal status: INITIAL    LONG TERM GOALS: Target date: 12/26/22  Patient will increase FOTO score to 68  to demonstrate predicted increase in functional mobility to complete ADLs Baseline: 44 Goal status: INITIAL  2.  Pt will decrease worst pain as reported on NPRS by at least 3 points in order to demonstrate clinically significant reduction in pain.  Baseline: 10/10 Goal status: INITIAL  3.  Pt will demonstrate cervical rotation of at least 70d bilat in order to drive safely Baseline: R: 51 L 59 Goal status: INITIAL    PLAN:  PT FREQUENCY: 1-2x/week  PT DURATION: 8 weeks  PLANNED INTERVENTIONS: Therapeutic exercises, Therapeutic activity, Neuromuscular re-education, Balance training, Gait training, Patient/Family education, Self Care, Joint mobilization, Joint manipulation, Visual/preceptual remediation/compensation, Dry Needling, Electrical stimulation, Spinal manipulation, Spinal mobilization, Cryotherapy, Moist heat, Traction, Ultrasound, Ionotophoresis /ml Dexamethasone, Manual therapy, and Re-evaluation  PLAN FOR NEXT SESSION: HEP review   Nolon Bussing, PT, DPT Physical Therapist - [place]  [facility]  11/11/22, 4:48 PM

## 2022-11-14 ENCOUNTER — Ambulatory Visit: Payer: BC Managed Care – PPO

## 2022-11-14 DIAGNOSIS — M25512 Pain in left shoulder: Secondary | ICD-10-CM | POA: Diagnosis not present

## 2022-11-14 DIAGNOSIS — G8929 Other chronic pain: Secondary | ICD-10-CM | POA: Diagnosis not present

## 2022-11-14 NOTE — Therapy (Signed)
OUTPATIENT PHYSICAL THERAPY SHOULDER TREATMENT   Patient Name: Aleen Marston MRN: [id number] DOB:08-18-1955, 67 y.o., female Today's Date: 11/14/2022  END OF SESSION:  PT End of Session - 11/14/22 1513     Visit Number 4    Number of Visits 17    Date for PT Re-Evaluation 12/27/22    Authorization - Visit Number 4    Authorization - Number of Visits 10    Progress Note Due on Visit 10    PT Start Time 1515    PT Stop Time 1600    PT Time Calculation (min) 45 min    Activity Tolerance Patient tolerated treatment well    Behavior During Therapy [facility] for tasks assessed/performed              Past Medical History:  Diagnosis Date   Chronic interstitial cystitis 10/11/2011   Depression    no history of hospitalizations   Diverticulosis    Dysrhythmia    GERD (gastroesophageal reflux disease)    Headache disorder    managed with imipramine   History of colonic polyps 2011   last colonoscopy, [facility]   SVT (supraventricular tachycardia)    normal screening ER workup   Past Surgical History:  Procedure Laterality Date   COLONOSCOPY WITH PROPOFOL N/A 05/19/2015   Procedure: COLONOSCOPY WITH PROPOFOL;  Surgeon: Scot Jun, MD;  Location: [facility] ENDOSCOPY;  Service: Endoscopy;  Laterality: N/A;   COLONOSCOPY WITH PROPOFOL N/A 12/20/2019   Procedure: COLONOSCOPY WITH PROPOFOL;  Surgeon: Pasty Spillers, MD;  Location: [facility] ENDOSCOPY;  Service: Endoscopy;  Laterality: N/A;   COLONOSCOPY WITH PROPOFOL N/A 07/11/2020   Procedure: COLONOSCOPY WITH PROPOFOL;  Surgeon: Wyline Mood, MD;  Location: [facility] ENDOSCOPY;  Service: Endoscopy;  Laterality: N/A;   DILATION AND CURETTAGE OF UTERUS     Patient Active Problem List   Diagnosis Date Noted   Arm pain, diffuse, left 05/21/2022   Anemia 02/26/2022   Stage 3a chronic kidney disease 02/22/2022   Bilateral bunions 11/22/2020   Bell's palsy 03/22/2020   Hyperlipidemia LDL goal <160 10/24/2019   Cervical cancer  screening 10/24/2019   Cervical disc disorder with radiculopathy of cervical region 03/14/2016   Encounter for preventive health examination 11/17/2012   Postmenopausal atrophic vaginitis 11/17/2012   GERD (gastroesophageal reflux disease)    Family history of colon cancer requiring screening colonoscopy    Osteoporosis, post-menopausal 10/11/2011   Uterine polyp 10/11/2011   Depression, major, in partial remission 10/11/2011   Migraine headache 10/11/2011   Diverticulosis     PCP: Darrick Huntsman MD  REFERRING PROVIDER: Darrick Huntsman MD  REFERRING DIAG: L shoulder pain   THERAPY DIAG:  Chronic left shoulder pain  Rationale for Evaluation and Treatment: Rehabilitation  ONSET DATE: Nov 2023  SUBJECTIVE:  SUBJECTIVE STATEMENT:  Pt reports she was having some pain yesterday due to not having her bio-antioxidants. She states she ran out of them and they won't get to her house until tomorrow.  She is noting 1/10 at this time.   Hand dominance: Right  PERTINENT HISTORY: Pt is a 67 year old female presenting with L shoulder pain with numbness in the finger tips, and electrical sensation from the inside her her arm beginning in her armpit down to the 3rd/4th/5th fingers. She has electrical sensations and finger tip numbness when she lays down to go to sleep at night, and no other time. Reports this pain wakes her from her sleep 80% of nights 1-2x/night. Reports pain may be worse with a lot of pulling activity "when I was pulling a hose once it worsened". Pain is worsened by sleeping on L side which is her typical sleeping position. 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 Occupation not inhibited by shoulder pain. Current and best pain 1/10; worst 10/10- pain is burning, electric; alleviated by  repositioning. Denies neck or head pain, or headaches. Pt denies N/V, B&B changes, unexplained weight fluctuation, saddle paresthesia, fever, night sweats, or unrelenting night pain at this time.   PAIN:  Are you having pain? Yes: NPRS scale: 1/10 Pain location: armpit to 3rd, 4th, 5th digit Pain description: burning/electric; short lasting Aggravating factors: sleeping (particularly on L side) Relieving factors: Re-positioning  PRECAUTIONS: None  WEIGHT BEARING RESTRICTIONS: No  FALLS:  Has patient fallen in last 6 months? Yes; walking in dark unfamiliar house, slipped on floor  LIVING ENVIRONMENT: Lives with: lives with their family Lives in: House/apartment Stairs: Yes: Internal: 4 steps; bilateral but cannot reach both and External: 12 steps; on right going up Has following equipment at home: None  OCCUPATION: Teacher at [employer]   PLOF: Independent  PATIENT GOALS:decrease pain to sleep   NEXT MD VISIT: none yet  OBJECTIVE:   DIAGNOSTIC FINDINGS:   CT neck: 1.  No abnormality seen along the brachial plexus. No apical lung mass. 2. Advanced cervical spine degeneration with left foraminal impingement at C5-6 and C6-7.  PATIENT SURVEYS:  FOTO 44 goal of 16  COGNITION: Overall cognitive status: Within functional limits for tasks assessed     SENSATION: WFL  POSTURE: FHRS, upper crossed Scapular dyskinesis with overhead mobility   UPPER EXTREMITY ROM:   Active ROM Right eval Left eval  Shoulder flexion WNL WNL  Shoulder extension WNL WNL  Shoulder abduction WNL WNL  Shoulder adduction    Shoulder internal rotation WNL/Apleys T10 WNL/Apleys T10  Shoulder external rotation WNL/Apleys CTJ WNL/Apleys CTJ  Elbow flexion WNL wnl  Elbow extension wnl wnl  Wrist flexion    Wrist extension    Wrist ulnar deviation    Wrist radial deviation    Wrist pronation    Wrist supination    (Blank rows = not tested)  Cervical:  Rotation R: 51 L 59 Lateral Flex R: 31  L 21 Ext and flex WNL  UPPER EXTREMITY MMT:  MMT Right eval Left eval  Shoulder flexion 5 5  Shoulder extension 5 5  Shoulder abduction 5 5  Shoulder adduction    Shoulder internal rotation 5 5  Shoulder external rotation 5 5  Middle trapezius 4+ 4+  Lower trapezius 4 4-  Elbow flexion    Elbow extension    Wrist flexion    Wrist extension    Wrist ulnar deviation  Wrist radial deviation    Wrist pronation    Wrist supination    Grip strength (lbs)    (Blank rows = not tested)  SHOULDER SPECIAL TESTS: Impingement tests: Neer impingement test: negative, Hawkins/Kennedy impingement test: negative, Painful arc test: negative, and O' Briens test: negative  Rotator cuff assessment: Drop arm test: negative, Empty can test: negative, Full can test: negative, External rotation lag sign: negative, Internal rotation lag sign: negative, Gerber lift off test: negative, Belly press test: negative, and Infraspinatus test: negative  TOS Rhett Bannister and Delford Field all negative  Beighton 3/9 (palm to floor and bilat elbows)  CERVICAL SPECIAL TESTS:  Upper limb tension test (ULTT): Positive, Spurling's test: Negative, and Distraction test: Negative  JOINT MOBILITY TESTING:  First rib: WNL GHJ WNL all directions Scapular mobility WNL all directions with excessive protraction  Upper thoracic spine hypomobile throughout  PALPATION:  Tension with trigger points, pain to deep palpation of    TODAY'S TREATMENT: DATE: 11/14/22  TherEx:  Nustep seat 8 UE 12 L1, 1.5 minutes, L3 3.5 min for gentle protraction/retraction mobility and strength Seated thoracic extension over half foam roller, x15 with 2 sec holds Seated GTB resisted scapular retraction and cervical retraction 2x12 with good carry over of min cuing following initial technique Standing row at [employer], 15#, x15 Seated lat pull down at [employer], 15#, x15 Standing bicep curls at [employer], 15#, x15 Standing serratus activation with  bolster rolls up the wall, x15 Standing UE supported bent lawn mower cranks, 5#, 2x15 each UE Wall pushups, x6 Wall pushups with large hedgehog for increased instability of the surface, x15 Ulnar nerve glides, x15 Ulnar nerve tensioner at wall, x15  Updated HEP and given pt a printed copy.    PATIENT EDUCATION: Education details: Patient was educated on diagnosis, anatomy and pathology involved, prognosis, role of PT, and was given an HEP, demonstrating exercise with proper form following verbal and tactile cues, and was given a paper hand out to continue exercise at home. Pt was educated on and agreed to plan of care.  Person educated: Patient Education method: Explanation, Demonstration, and Handouts Education comprehension: verbalized understanding and returned demonstration  HOME EXERCISE PROGRAM: [id number]  ASSESSMENT:  CLINICAL IMPRESSION:  Pt performed well with all the activities given.  No noticeable pain upon performing the exercises listed above and pt continues to demonstrate full functional ROM when performing the exercises.  Pt requested new HEP and was given with instructions on how to perform and utilize online videos for instruction as well.   Pt will continue to benefit from skilled therapy to address remaining deficits in order to improve overall QoL and return to PLOF.       OBJECTIVE IMPAIRMENTS: decreased activity tolerance, decreased endurance, decreased mobility, decreased ROM, decreased strength, increased fascial restrictions, impaired flexibility, impaired sensation, impaired UE functional use, improper body mechanics, postural dysfunction, and pain.   ACTIVITY LIMITATIONS: carrying, lifting, sleeping, and transfers  PARTICIPATION LIMITATIONS: meal prep, cleaning, laundry, driving, shopping, community activity, and occupation  PERSONAL FACTORS: Age, Fitness, Past/current experiences, Sex, Time since onset of injury/illness/exacerbation, and 1-2  comorbidities: osteoporosis, depression, anemia, chronic LUE pain  are also affecting patient's functional outcome.   REHAB POTENTIAL: Good  CLINICAL DECISION MAKING: Evolving/moderate complexity  EVALUATION COMPLEXITY: Moderate   GOALS: Goals reviewed with patient? Yes  SHORT TERM GOALS: Target date: 11/11/22  Pt will be independent with HEP in order to improve strength and balance in order to decrease fall risk and improve function  at home and work.  Baseline: HEP given  Goal status: INITIAL    LONG TERM GOALS: Target date: 12/26/22  Patient will increase FOTO score to 68  to demonstrate predicted increase in functional mobility to complete ADLs Baseline: 44 Goal status: INITIAL  2.  Pt will decrease worst pain as reported on NPRS by at least 3 points in order to demonstrate clinically significant reduction in pain. Baseline: 10/10 Goal status: INITIAL  3.  Pt will demonstrate cervical rotation of at least 70d bilat in order to drive safely Baseline: R: 51 L 59 Goal status: INITIAL    PLAN:  PT FREQUENCY: 1-2x/week  PT DURATION: 8 weeks  PLANNED INTERVENTIONS: Therapeutic exercises, Therapeutic activity, Neuromuscular re-education, Balance training, Gait training, Patient/Family education, Self Care, Joint mobilization, Joint manipulation, Visual/preceptual remediation/compensation, Dry Needling, Electrical stimulation, Spinal manipulation, Spinal mobilization, Cryotherapy, Moist heat, Traction, Ultrasound, Ionotophoresis /ml Dexamethasone, Manual therapy, and Re-evaluation  PLAN FOR NEXT SESSION: HEP review   Nolon Bussing, PT, DPT Physical Therapist - [place]  [facility]  11/14/22, 3:13 PM

## 2022-11-18 ENCOUNTER — Ambulatory Visit: Payer: BC Managed Care – PPO

## 2022-11-18 DIAGNOSIS — G8929 Other chronic pain: Secondary | ICD-10-CM

## 2022-11-18 DIAGNOSIS — M25512 Pain in left shoulder: Secondary | ICD-10-CM | POA: Diagnosis not present

## 2022-11-18 NOTE — Therapy (Signed)
OUTPATIENT PHYSICAL THERAPY SHOULDER TREATMENT   Patient Name: Lauren Tanner MRN: [id number] DOB:15-Mar-1956, 67 y.o., female Today's Date: 11/18/2022  END OF SESSION:  PT End of Session - 11/18/22 1600     Visit Number 5    Number of Visits 17    Date for PT Re-Evaluation 12/27/22    Authorization - Visit Number 5    Authorization - Number of Visits 10    Progress Note Due on Visit 10    PT Start Time 1601    PT Stop Time 1645    PT Time Calculation (min) 44 min    Activity Tolerance Patient tolerated treatment well    Behavior During Therapy [facility] for tasks assessed/performed              Past Medical History:  Diagnosis Date   Chronic interstitial cystitis 10/11/2011   Depression    no history of hospitalizations   Diverticulosis    Dysrhythmia    GERD (gastroesophageal reflux disease)    Headache disorder    managed with imipramine   History of colonic polyps 2011   last colonoscopy, [facility]   SVT (supraventricular tachycardia)    normal screening ER workup   Past Surgical History:  Procedure Laterality Date   COLONOSCOPY WITH PROPOFOL N/A 05/19/2015   Procedure: COLONOSCOPY WITH PROPOFOL;  Surgeon: Scot Jun, MD;  [facility] ENDOSCOPY;  Service: Endoscopy;  Laterality: N/A;   COLONOSCOPY WITH PROPOFOL N/A 12/20/2019   Procedure: COLONOSCOPY WITH PROPOFOL;  Surgeon: Pasty Spillers, MD;  Location: [facility] ENDOSCOPY;  Service: Endoscopy;  Laterality: N/A;   COLONOSCOPY WITH PROPOFOL N/A 07/11/2020   Procedure: COLONOSCOPY WITH PROPOFOL;  Surgeon: Wyline Mood, MD;  Location: [facility] ENDOSCOPY;  Service: Endoscopy;  Laterality: N/A;   DILATION AND CURETTAGE OF UTERUS     Patient Active Problem List   Diagnosis Date Noted   Arm pain, diffuse, left 05/21/2022   Anemia 02/26/2022   Stage 3a chronic kidney disease 02/22/2022   Bilateral bunions 11/22/2020   Bell's palsy 03/22/2020   Hyperlipidemia LDL goal <160 10/24/2019   Cervical cancer  screening 10/24/2019   Cervical disc disorder with radiculopathy of cervical region 03/14/2016   Encounter for preventive health examination 11/17/2012   Postmenopausal atrophic vaginitis 11/17/2012   GERD (gastroesophageal reflux disease)    Family history of colon cancer requiring screening colonoscopy    Osteoporosis, post-menopausal 10/11/2011   Uterine polyp 10/11/2011   Depression, major, in partial remission 10/11/2011   Migraine headache 10/11/2011   Diverticulosis     PCP: Darrick Huntsman MD  REFERRING PROVIDER: Darrick Huntsman MD  REFERRING DIAG: L shoulder pain   THERAPY DIAG:  Chronic left shoulder pain  Rationale for Evaluation and Treatment: Rehabilitation  ONSET DATE: Nov 2023  SUBJECTIVE:  SUBJECTIVE STATEMENT:  Pt reports no complaints upon arrival.  Pt is doing well and feels as though the HEP is going well at home.   Hand dominance: Right  PERTINENT HISTORY: Pt is a 67 year old female presenting with L shoulder pain with numbness in the finger tips, and electrical sensation from the inside her her arm beginning in her armpit down to the 3rd/4th/5th fingers. She has electrical sensations and finger tip numbness when she lays down to go to sleep at night, and no other time. Reports this pain wakes her from her sleep 80% of nights 1-2x/night. Reports pain may be worse with a lot of pulling activity "when I was pulling a hose once it worsened". Pain is worsened by sleeping on L side which is her typical sleeping position. 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, and enjoys walking a mile 3x/week. Occupation not inhibited by shoulder pain. Current and best pain 1/10; worst 10/10- pain is burning, electric; alleviated by repositioning. Denies neck or head pain, or headaches. Pt denies N/V, B&B changes,  unexplained weight fluctuation, saddle paresthesia, fever, night sweats, or unrelenting night pain at this time.   PAIN:  Are you having pain? Yes: NPRS scale: 1/10 Pain location: armpit to 3rd, 4th, 5th digit Pain description: burning/electric; short lasting Aggravating factors: sleeping (particularly on L side) Relieving factors: Re-positioning  PRECAUTIONS: None  WEIGHT BEARING RESTRICTIONS: No  FALLS:  Has patient fallen in last 6 months? Yes; walking in dark unfamiliar house, slipped on floor  LIVING ENVIRONMENT: Lives with: lives with their family Lives in: House/apartment Stairs: Yes: Internal: 4 steps; bilateral but cannot reach both and External: 12 steps; on right going up Has following equipment at home: None  OCCUPATION: Teacher at [employer]   PLOF: Independent  PATIENT GOALS:decrease pain to sleep   NEXT MD VISIT: none yet  OBJECTIVE:   DIAGNOSTIC FINDINGS:   CT neck: 1.  No abnormality seen along the brachial plexus. No apical lung mass. 2. Advanced cervical spine degeneration with left foraminal impingement at C5-6 and C6-7.  PATIENT SURVEYS:  FOTO 44 goal of 20  COGNITION: Overall cognitive status: Within functional limits for tasks assessed     SENSATION: WFL  POSTURE: FHRS, upper crossed Scapular dyskinesis with overhead mobility   UPPER EXTREMITY ROM:   Active ROM Right eval Left eval  Shoulder flexion WNL WNL  Shoulder extension WNL WNL  Shoulder abduction WNL WNL  Shoulder adduction    Shoulder internal rotation WNL/Apleys T10 WNL/Apleys T10  Shoulder external rotation WNL/Apleys CTJ WNL/Apleys CTJ  Elbow flexion WNL wnl  Elbow extension wnl wnl  Wrist flexion    Wrist extension    Wrist ulnar deviation    Wrist radial deviation    Wrist pronation    Wrist supination    (Blank rows = not tested)  Cervical:  Rotation R: 51 L 59 Lateral Flex R: 31 L 21 Ext and flex WNL  UPPER EXTREMITY MMT:  MMT Right eval Left eval   Shoulder flexion 5 5  Shoulder extension 5 5  Shoulder abduction 5 5  Shoulder adduction    Shoulder internal rotation 5 5  Shoulder external rotation 5 5  Middle trapezius 4+ 4+  Lower trapezius 4 4-  Elbow flexion    Elbow extension    Wrist flexion    Wrist extension    Wrist ulnar deviation    Wrist radial deviation    Wrist pronation    Wrist supination  Grip strength (lbs)    (Blank rows = not tested)  SHOULDER SPECIAL TESTS: Impingement tests: Neer impingement test: negative, Hawkins/Kennedy impingement test: negative, Painful arc test: negative, and O' Briens test: negative  Rotator cuff assessment: Drop arm test: negative, Empty can test: negative, Full can test: negative, External rotation lag sign: negative, Internal rotation lag sign: negative, Gerber lift off test: negative, Belly press test: negative, and Infraspinatus test: negative  TOS Rhett Bannister and Delford Field all negative  Beighton 3/9 (palm to floor and bilat elbows)  CERVICAL SPECIAL TESTS:  Upper limb tension test (ULTT): Positive, Spurling's test: Negative, and Distraction test: Negative  JOINT MOBILITY TESTING:  First rib: WNL GHJ WNL all directions Scapular mobility WNL all directions with excessive protraction  Upper thoracic spine hypomobile throughout  PALPATION:  Tension with trigger points, pain to deep palpation of    TODAY'S TREATMENT: DATE: 11/18/22  TherEx:  Nustep seat 7 UE 9 L4 5 min for gentle protraction/retraction mobility and strength Seated thoracic extension over half foam roller, x15 with 2 sec holds Seated row at [employer], 20#, x15 Seated lat pull down at [employer], 15#, x15 Standing bicep curls, 8# DB each UE, x15 Standing serratus activation with bolster rolls up the wall, x15 Standing wall slides into full flexion and lift off for serratus activation, 2x10 Standing UE supported bent lawn mower cranks, 5#, 2x15 each UE Standing shoulder circles with green physioball, CW/CCW,  30 sec bouts each direction Standing weighted ball slams from overhead position, 15# ball, x10      PATIENT EDUCATION: Education details: Patient was educated on diagnosis, anatomy and pathology involved, prognosis, role of PT, and was given an HEP, demonstrating exercise with proper form following verbal and tactile cues, and was given a paper hand out to continue exercise at home. Pt was educated on and agreed to plan of care.  Person educated: Patient Education method: Explanation, Demonstration, and Handouts Education comprehension: verbalized understanding and returned demonstration  HOME EXERCISE PROGRAM: [id number]  ASSESSMENT:  CLINICAL IMPRESSION:  Pt continues to demonstrate improved strength with exercises noted and is denies increased pain when performing those exercises.  Pt does continue to require consistent verbal cuing and at times visual demonstration of the exercises in order to perform correctly.  Tactile feedback has been beneficial as well, however pt still has lack of carry over at times.   Pt will continue to benefit from skilled therapy to address remaining deficits in order to improve overall QoL and return to PLOF.         OBJECTIVE IMPAIRMENTS: decreased activity tolerance, decreased endurance, decreased mobility, decreased ROM, decreased strength, increased fascial restrictions, impaired flexibility, impaired sensation, impaired UE functional use, improper body mechanics, postural dysfunction, and pain.   ACTIVITY LIMITATIONS: carrying, lifting, sleeping, and transfers  PARTICIPATION LIMITATIONS: meal prep, cleaning, laundry, driving, shopping, community activity, and occupation  PERSONAL FACTORS: Age, Fitness, Past/current experiences, Sex, Time since onset of injury/illness/exacerbation, and 1-2 comorbidities: osteoporosis, depression, anemia, chronic LUE pain  are also affecting patient's functional outcome.   REHAB POTENTIAL: Good  CLINICAL DECISION  MAKING: Evolving/moderate complexity  EVALUATION COMPLEXITY: Moderate   GOALS: Goals reviewed with patient? Yes  SHORT TERM GOALS: Target date: 11/11/22  Pt will be independent with HEP in order to improve strength and balance in order to decrease fall risk and improve function at home and work.  Baseline: HEP given  Goal status: INITIAL    LONG TERM GOALS: Target date: 12/26/22  Patient will increase FOTO  score to 68  to demonstrate predicted increase in functional mobility to complete ADLs Baseline: 44 Goal status: INITIAL  2.  Pt will decrease worst pain as reported on NPRS by at least 3 points in order to demonstrate clinically significant reduction in pain. Baseline: 10/10 Goal status: INITIAL  3.  Pt will demonstrate cervical rotation of at least 70d bilat in order to drive safely Baseline: R: 51 L 59 Goal status: INITIAL    PLAN:  PT FREQUENCY: 1-2x/week  PT DURATION: 8 weeks  PLANNED INTERVENTIONS: Therapeutic exercises, Therapeutic activity, Neuromuscular re-education, Balance training, Gait training, Patient/Family education, Self Care, Joint mobilization, Joint manipulation, Visual/preceptual remediation/compensation, Dry Needling, Electrical stimulation, Spinal manipulation, Spinal mobilization, Cryotherapy, Moist heat, Traction, Ultrasound, Ionotophoresis 4mg /ml Dexamethasone, Manual therapy, and Re-evaluation  PLAN FOR NEXT SESSION: HEP review   Nolon Bussing, PT, DPT Physical Therapist - [place]  [facility]  11/18/22, 5:01 PM

## 2022-11-27 ENCOUNTER — Ambulatory Visit: Payer: BC Managed Care – PPO | Attending: Internal Medicine

## 2022-11-27 DIAGNOSIS — M25512 Pain in left shoulder: Secondary | ICD-10-CM | POA: Insufficient documentation

## 2022-11-27 DIAGNOSIS — G8929 Other chronic pain: Secondary | ICD-10-CM | POA: Diagnosis not present

## 2022-11-27 NOTE — Therapy (Signed)
OUTPATIENT PHYSICAL THERAPY SHOULDER TREATMENT   Patient Name: Lillieanna Tuohy MRN: [id number] DOB:04/29/1956, 67 y.o., female Today's Date: 11/27/2022  END OF SESSION:  PT End of Session - 11/27/22 1521     Visit Number 6    Number of Visits 17    Date for PT Re-Evaluation 12/27/22    Authorization - Visit Number 6    Authorization - Number of Visits 10    Progress Note Due on Visit 10    PT Start Time 1520    PT Stop Time 1600    PT Time Calculation (min) 40 min    Activity Tolerance Patient tolerated treatment well    Behavior During Therapy [facility] for tasks assessed/performed              Past Medical History:  Diagnosis Date   Chronic interstitial cystitis 10/11/2011   Depression    no history of hospitalizations   Diverticulosis    Dysrhythmia    GERD (gastroesophageal reflux disease)    Headache disorder    managed with imipramine   History of colonic polyps 2011   last colonoscopy, [facility]   SVT (supraventricular tachycardia)    normal screening ER workup   Past Surgical History:  Procedure Laterality Date   COLONOSCOPY WITH PROPOFOL N/A 05/19/2015   Procedure: COLONOSCOPY WITH PROPOFOL;  Surgeon: Scot Jun, MD;  Location: [facility] ENDOSCOPY;  Service: Endoscopy;  Laterality: N/A;   COLONOSCOPY WITH PROPOFOL N/A 12/20/2019   Procedure: COLONOSCOPY WITH PROPOFOL;  Surgeon: Pasty Spillers, MD;  Location: [facility] ENDOSCOPY;  Service: Endoscopy;  Laterality: N/A;   COLONOSCOPY WITH PROPOFOL N/A 07/11/2020   Procedure: COLONOSCOPY WITH PROPOFOL;  Surgeon: Wyline Mood, MD;  Location: [facility] ENDOSCOPY;  Service: Endoscopy;  Laterality: N/A;   DILATION AND CURETTAGE OF UTERUS     Patient Active Problem List   Diagnosis Date Noted   Arm pain, diffuse, left 05/21/2022   Anemia 02/26/2022   Stage 3a chronic kidney disease (HCC) 02/22/2022   Bilateral bunions 11/22/2020   Bell's palsy 03/22/2020   Hyperlipidemia LDL goal <160 10/24/2019   Cervical cancer  screening 10/24/2019   Cervical disc disorder with radiculopathy of cervical region 03/14/2016   Encounter for preventive health examination 11/17/2012   Postmenopausal atrophic vaginitis 11/17/2012   GERD (gastroesophageal reflux disease)    Family history of colon cancer requiring screening colonoscopy    Osteoporosis, post-menopausal 10/11/2011   Uterine polyp 10/11/2011   Depression, major, in partial remission (HCC) 10/11/2011   Migraine headache 10/11/2011   Diverticulosis     PCP: Darrick Huntsman MD  REFERRING PROVIDER: Darrick Huntsman MD  REFERRING DIAG: L shoulder pain   THERAPY DIAG:  Chronic left shoulder pain  Rationale for Evaluation and Treatment: Rehabilitation  ONSET DATE: Nov 2023  SUBJECTIVE:  SUBJECTIVE STATEMENT:  Pt reports she has not been exercising with all the new exercises, but is still keeping track of her old ones.  Pt states that she has been doing well and noting some difficulty last night, but nothing major and it has since resolved.  Pt denies pain upon arrival however.   Hand dominance: Right  PERTINENT HISTORY:  Pt is a 67 year old female presenting with L shoulder pain with numbness in the finger tips, and electrical sensation from the inside her her arm beginning in her armpit down to the 3rd/4th/5th fingers. She has electrical sensations and finger tip numbness when she lays down to go to sleep at night, and no other time. Reports this pain wakes her from her sleep 80% of nights 1-2x/night. Reports pain may be worse with a lot of pulling activity "when I was pulling a hose once it worsened". Pain is worsened by sleeping on L side which is her typical sleeping position. 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 Occupation not inhibited by shoulder pain.  Current and best pain 1/10; worst 10/10- pain is burning, electric; alleviated by repositioning. Denies neck or head pain, or headaches. Pt denies N/V, B&B changes, unexplained weight fluctuation, saddle paresthesia, fever, night sweats, or unrelenting night pain at this time.   PAIN:  Are you having pain? Yes: NPRS scale: 0/10 Pain location: armpit to 3rd, 4th, 5th digit Pain description: burning/electric; short lasting Aggravating factors: sleeping (particularly on L side) Relieving factors: Re-positioning  PRECAUTIONS: None  WEIGHT BEARING RESTRICTIONS: No  FALLS:  Has patient fallen in last 6 months? Yes; walking in dark unfamiliar house, slipped on floor  LIVING ENVIRONMENT: Lives with: lives with their family Lives in: House/apartment Stairs: Yes: Internal: 4 steps; bilateral but cannot reach both and External: 12 steps; on right going up Has following equipment at home: None  OCCUPATION: Teacher at [employer]   PLOF: Independent  PATIENT GOALS:decrease pain to sleep   NEXT MD VISIT: none yet  OBJECTIVE:   DIAGNOSTIC FINDINGS:   CT neck: 1.  No abnormality seen along the brachial plexus. No apical lung mass. 2. Advanced cervical spine degeneration with left foraminal impingement at C5-6 and C6-7.  PATIENT SURVEYS:  FOTO 44 goal of 85  COGNITION: Overall cognitive status: Within functional limits for tasks assessed     SENSATION: WFL  POSTURE: FHRS, upper crossed Scapular dyskinesis with overhead mobility   UPPER EXTREMITY ROM:   Active ROM Right eval Left eval  Shoulder flexion WNL WNL  Shoulder extension WNL WNL  Shoulder abduction WNL WNL  Shoulder adduction    Shoulder internal rotation WNL/Apleys T10 WNL/Apleys T10  Shoulder external rotation WNL/Apleys CTJ WNL/Apleys CTJ  Elbow flexion WNL wnl  Elbow extension wnl wnl  Wrist flexion    Wrist extension    Wrist ulnar deviation    Wrist radial deviation    Wrist pronation    Wrist supination     (Blank rows = not tested)  Cervical:  Rotation R: 51 L 59 Lateral Flex R: 31 L 21 Ext and flex WNL  UPPER EXTREMITY MMT:  MMT Right eval Left eval  Shoulder flexion 5 5  Shoulder extension 5 5  Shoulder abduction 5 5  Shoulder adduction    Shoulder internal rotation 5 5  Shoulder external rotation 5 5  Middle trapezius 4+ 4+  Lower trapezius 4 4-  Elbow flexion    Elbow extension    Wrist flexion  Wrist extension    Wrist ulnar deviation    Wrist radial deviation    Wrist pronation    Wrist supination    Grip strength (lbs)    (Blank rows = not tested)  SHOULDER SPECIAL TESTS: Impingement tests: Neer impingement test: negative, Hawkins/Kennedy impingement test: negative, Painful arc test: negative, and O' Briens test: negative  Rotator cuff assessment: Drop arm test: negative, Empty can test: negative, Full can test: negative, External rotation lag sign: negative, Internal rotation lag sign: negative, Gerber lift off test: negative, Belly press test: negative, and Infraspinatus test: negative  TOS Rhett Bannister and Delford Field all negative  Beighton 3/9 (palm to floor and bilat elbows)  CERVICAL SPECIAL TESTS:  Upper limb tension test (ULTT): Positive, Spurling's test: Negative, and Distraction test: Negative  JOINT MOBILITY TESTING:  First rib: WNL GHJ WNL all directions Scapular mobility WNL all directions with excessive protraction  Upper thoracic spine hypomobile throughout  PALPATION:  Tension with trigger points, pain to deep palpation of    TODAY'S TREATMENT: DATE: 11/27/22   TherEx:  Nustep seat 9 UE 9 L4 5 min for gentle protraction/retraction mobility and strength Supine chest press with 5# DB's in each Ue, 2x10 Supine serratus punches, 5#, 2x10 Seated resisted ER with GTB, x10 each Standing scapular retraction with GTB for resistance, x10 Standing shoulder extension with GTB for resistance, x10   Manual:  Supine STM to cervical region to  increase extensibility of the paraspinals Supine suboccipital release technique to decrease cervicalgia Supine manual traction performed in order to increase joint space in cervical region for pain relief Supine cervical upglides/downglides, 30 sec bouts Supine UT/Levator stretch, 30 sec bouts to increase tissue extensibility of the cervical region      PATIENT EDUCATION: Education details: Patient was educated on diagnosis, anatomy and pathology involved, prognosis, role of PT, and was given an HEP, demonstrating exercise with proper form following verbal and tactile cues, and was given a paper hand out to continue exercise at home. Pt was educated on and agreed to plan of care.  Person educated: Patient Education method: Explanation, Demonstration, and Handouts Education comprehension: verbalized understanding and returned demonstration  HOME EXERCISE PROGRAM: [id number]  ASSESSMENT:  CLINICAL IMPRESSION:  Pt continues to respond well to the exercises and noticed an improvement in overall ROM of the neck following the manual therapy.  Pt notes that she is wanting more stability exercises to do at home without the use of gym equipment at home.  Pt given several updated exercises to do as part of her HEP and was given printed copy for take home.   Pt will continue to benefit from skilled therapy to address remaining deficits in order to improve overall QoL and return to PLOF.          OBJECTIVE IMPAIRMENTS: decreased activity tolerance, decreased endurance, decreased mobility, decreased ROM, decreased strength, increased fascial restrictions, impaired flexibility, impaired sensation, impaired UE functional use, improper body mechanics, postural dysfunction, and pain.   ACTIVITY LIMITATIONS: carrying, lifting, sleeping, and transfers  PARTICIPATION LIMITATIONS: meal prep, cleaning, laundry, driving, shopping, community activity, and occupation  PERSONAL FACTORS: Age, Fitness,  Past/current experiences, Sex, Time since onset of injury/illness/exacerbation, and 1-2 comorbidities: osteoporosis, depression, anemia, chronic LUE pain  are also affecting patient's functional outcome.   REHAB POTENTIAL: Good  CLINICAL DECISION MAKING: Evolving/moderate complexity  EVALUATION COMPLEXITY: Moderate   GOALS: Goals reviewed with patient? Yes  SHORT TERM GOALS: Target date: 11/11/22  Pt will be independent with HEP  in order to improve strength and balance in order to decrease fall risk and improve function at home and work.  Baseline: HEP given  Goal status: INITIAL    LONG TERM GOALS: Target date: 12/26/22  Patient will increase FOTO score to 68  to demonstrate predicted increase in functional mobility to complete ADLs Baseline: 44 Goal status: INITIAL  2.  Pt will decrease worst pain as reported on NPRS by at least 3 points in order to demonstrate clinically significant reduction in pain. Baseline: 10/10 Goal status: INITIAL  3.  Pt will demonstrate cervical rotation of at least 70d bilat in order to drive safely Baseline: R: 51 L 59 Goal status: INITIAL    PLAN:  PT FREQUENCY: 1-2x/week  PT DURATION: 8 weeks  PLANNED INTERVENTIONS: Therapeutic exercises, Therapeutic activity, Neuromuscular re-education, Balance training, Gait training, Patient/Family education, Self Care, Joint mobilization, Joint manipulation, Visual/preceptual remediation/compensation, Dry Needling, Electrical stimulation, Spinal manipulation, Spinal mobilization, Cryotherapy, Moist heat, Traction, Ultrasound, Ionotophoresis 4mg /ml Dexamethasone, Manual therapy, and Re-evaluation  PLAN FOR NEXT SESSION: HEP review   Nolon Bussing, PT, DPT Physical Therapist - [place]  [facility]  11/27/22, 3:21 PM

## 2022-12-04 ENCOUNTER — Other Ambulatory Visit: Payer: Self-pay | Admitting: Internal Medicine

## 2022-12-04 ENCOUNTER — Ambulatory Visit: Payer: BC Managed Care – PPO

## 2022-12-04 DIAGNOSIS — M25512 Pain in left shoulder: Secondary | ICD-10-CM | POA: Diagnosis not present

## 2022-12-04 DIAGNOSIS — G8929 Other chronic pain: Secondary | ICD-10-CM

## 2022-12-04 NOTE — Therapy (Addendum)
OUTPATIENT PHYSICAL THERAPY SHOULDER TREATMENT   Patient Name: Lauren Tanner MRN: [id number] DOB:1956/03/07, 67 y.o., female Today's Date: 12/04/2022  END OF SESSION:  PT End of Session - 12/04/22 1519     Visit Number 7    Number of Visits 17    Date for PT Re-Evaluation 12/27/22    Authorization - Visit Number 7    Authorization - Number of Visits 10    Progress Note Due on Visit 10    PT Start Time 1519    PT Stop Time 1600    PT Time Calculation (min) 41 min    Activity Tolerance Patient tolerated treatment well    Behavior During Therapy [facility] for tasks assessed/performed               Past Medical History:  Diagnosis Date   Chronic interstitial cystitis 10/11/2011   Depression    no history of hospitalizations   Diverticulosis    Dysrhythmia    GERD (gastroesophageal reflux disease)    Headache disorder    managed with imipramine   History of colonic polyps 2011   last colonoscopy, [facility]   SVT (supraventricular tachycardia)    normal screening ER workup   Past Surgical History:  Procedure Laterality Date   COLONOSCOPY WITH PROPOFOL N/A 05/19/2015   Procedure: COLONOSCOPY WITH PROPOFOL;  Surgeon: Scot Jun, MD;  Location[facility] ENDOSCOPY;  Service: Endoscopy;  Laterality: N/A;   COLONOSCOPY WITH PROPOFOL N/A 12/20/2019   Procedure: COLONOSCOPY WITH PROPOFOL;  Surgeon: Pasty Spillers, MD;  Location: [facility] ENDOSCOPY;  Service: Endoscopy;  Laterality: N/A;   COLONOSCOPY WITH PROPOFOL N/A 07/11/2020   Procedure: COLONOSCOPY WITH PROPOFOL;  Surgeon: Wyline Mood, MD;  Location: [facility] ENDOSCOPY;  Service: Endoscopy;  Laterality: N/A;   DILATION AND CURETTAGE OF UTERUS     Patient Active Problem List   Diagnosis Date Noted   Arm pain, diffuse, left 05/21/2022   Anemia 02/26/2022   Stage 3a chronic kidney disease (HCC) 02/22/2022   Bilateral bunions 11/22/2020   Bell's palsy 03/22/2020   Hyperlipidemia LDL goal <160 10/24/2019   Cervical cancer  screening 10/24/2019   Cervical disc disorder with radiculopathy of cervical region 03/14/2016   Encounter for preventive health examination 11/17/2012   Postmenopausal atrophic vaginitis 11/17/2012   GERD (gastroesophageal reflux disease)    Family history of colon cancer requiring screening colonoscopy    Osteoporosis, post-menopausal 10/11/2011   Uterine polyp 10/11/2011   Depression, major, in partial remission (HCC) 10/11/2011   Migraine headache 10/11/2011   Diverticulosis     PCP: Darrick Huntsman MD  REFERRING PROVIDER: Darrick Huntsman MD  REFERRING DIAG: L shoulder pain   THERAPY DIAG:  Chronic left shoulder pain  Rationale for Evaluation and Treatment: Rehabilitation  ONSET DATE: Nov 2023  SUBJECTIVE:  SUBJECTIVE STATEMENT:  Pt reports she was experiencing some discomfort in the shoulder during her last class of the day.  Pt notes that she does not utilize the L shoulder to write or anything, so unsure as to why it was hurting more.  Pt denies any pain upon arrival to the clinic however.   Hand dominance: Right  PERTINENT HISTORY:  Pt is a 67 year old female presenting with L shoulder pain with numbness in the finger tips, and electrical sensation from the inside her her arm beginning in her armpit down to the 3rd/4th/5th fingers. She has electrical sensations and finger tip numbness when she lays down to go to sleep at night, and no other time. Reports this pain wakes her from her sleep 80% of nights 1-2x/night. Reports pain may be worse with a lot of pulling activity "when I was pulling a hose once it worsened". Pain is worsened by sleeping on L side which is her typical sleeping position. 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 Occupation not inhibited by shoulder pain. Current  and best pain 1/10; worst 10/10- pain is burning, electric; alleviated by repositioning. Denies neck or head pain, or headaches. Pt denies N/V, B&B changes, unexplained weight fluctuation, saddle paresthesia, fever, night sweats, or unrelenting night pain at this time.   PAIN:  Are you having pain? Yes: NPRS scale: 0/10 Pain location: armpit to 3rd, 4th, 5th digit Pain description: burning/electric; short lasting Aggravating factors: sleeping (particularly on L side) Relieving factors: Re-positioning  PRECAUTIONS: None  WEIGHT BEARING RESTRICTIONS: No  FALLS:  Has patient fallen in last 6 months? Yes; walking in dark unfamiliar house, slipped on floor  LIVING ENVIRONMENT: Lives with: lives with their family Lives in: House/apartment Stairs: Yes: Internal: 4 steps; bilateral but cannot reach both and External: 12 steps; on right going up Has following equipment at home: None  OCCUPATION: Teacher at [employer]   PLOF: Independent  PATIENT GOALS:decrease pain to sleep   NEXT MD VISIT: none yet  OBJECTIVE:   DIAGNOSTIC FINDINGS:   CT neck: 1.  No abnormality seen along the brachial plexus. No apical lung mass. 2. Advanced cervical spine degeneration with left foraminal impingement at C5-6 and C6-7.  PATIENT SURVEYS:  FOTO 44 goal of 35  COGNITION: Overall cognitive status: Within functional limits for tasks assessed     SENSATION: WFL  POSTURE: FHRS, upper crossed Scapular dyskinesis with overhead mobility   UPPER EXTREMITY ROM:   Active ROM Right eval Left eval  Shoulder flexion WNL WNL  Shoulder extension WNL WNL  Shoulder abduction WNL WNL  Shoulder adduction    Shoulder internal rotation WNL/Apleys T10 WNL/Apleys T10  Shoulder external rotation WNL/Apleys CTJ WNL/Apleys CTJ  Elbow flexion WNL wnl  Elbow extension wnl wnl  Wrist flexion    Wrist extension    Wrist ulnar deviation    Wrist radial deviation    Wrist pronation    Wrist supination    (Blank  rows = not tested)  Cervical:  Rotation R: 51 L 59 Lateral Flex R: 31 L 21 Ext and flex WNL  UPPER EXTREMITY MMT:  MMT Right eval Left eval  Shoulder flexion 5 5  Shoulder extension 5 5  Shoulder abduction 5 5  Shoulder adduction    Shoulder internal rotation 5 5  Shoulder external rotation 5 5  Middle trapezius 4+ 4+  Lower trapezius 4 4-  Elbow flexion    Elbow extension    Wrist  flexion    Wrist extension    Wrist ulnar deviation    Wrist radial deviation    Wrist pronation    Wrist supination    Grip strength (lbs)    (Blank rows = not tested)  SHOULDER SPECIAL TESTS: Impingement tests: Neer impingement test: negative, Hawkins/Kennedy impingement test: negative, Painful arc test: negative, and O' Briens test: negative  Rotator cuff assessment: Drop arm test: negative, Empty can test: negative, Full can test: negative, External rotation lag sign: negative, Internal rotation lag sign: negative, Gerber lift off test: negative, Belly press test: negative, and Infraspinatus test: negative  TOS Rhett Bannister and Delford Field all negative  Beighton 3/9 (palm to floor and bilat elbows)  CERVICAL SPECIAL TESTS:  Upper limb tension test (ULTT): Positive, Spurling's test: Negative, and Distraction test: Negative  JOINT MOBILITY TESTING:  First rib: WNL GHJ WNL all directions Scapular mobility WNL all directions with excessive protraction  Upper thoracic spine hypomobile throughout  PALPATION:  Tension with trigger points, pain to deep palpation of    TODAY'S TREATMENT: DATE: 12/04/22  TherEx:  Nustep seat 9 UE 9 L4 5 min for gentle protraction/retraction mobility and strength Supine chest press with PVC pipe and 15# AW placed on pipe, 2x15 Supine serratus punches with PVC pipe and 15# AW placed on pipe, 2x10 Seated resisted ER with GTB, x10 each Seated scapular retraction with GTB for resistance, x10    Manual:  Supine STM to cervical region to increase  extensibility of the paraspinals Supine suboccipital release technique to decrease cervicalgia Supine manual traction performed in order to increase joint space in cervical region for pain relief Supine cervical upglides/downglides, 30 sec bouts Supine UT/Levator stretch, 30 sec bouts to increase tissue extensibility of the cervical region      PATIENT EDUCATION: Education details: Patient was educated on diagnosis, anatomy and pathology involved, prognosis, role of PT, and was given an HEP, demonstrating exercise with proper form following verbal and tactile cues, and was given a paper hand out to continue exercise at home. Pt was educated on and agreed to plan of care.  Person educated: Patient Education method: Explanation, Demonstration, and Handouts Education comprehension: verbalized understanding and returned demonstration  HOME EXERCISE PROGRAM: [id number]  ASSESSMENT:  CLINICAL IMPRESSION:  Pt responded well to the exercises listed above and noted no increase in pain.  Pt is demonstrating improved ability to perform the exercises with reduced necessity for verbal cuing. Pt demonstrates good carryover from session-to-session.  Pt noted to have continued mobilization restrictions in the shoulder/cervical spine and will continue to benefit from use of existing measures to address these deficits.   Pt will continue to benefit from skilled therapy to address remaining deficits in order to improve overall QoL and return to PLOF.            OBJECTIVE IMPAIRMENTS: decreased activity tolerance, decreased endurance, decreased mobility, decreased ROM, decreased strength, increased fascial restrictions, impaired flexibility, impaired sensation, impaired UE functional use, improper body mechanics, postural dysfunction, and pain.   ACTIVITY LIMITATIONS: carrying, lifting, sleeping, and transfers  PARTICIPATION LIMITATIONS: meal prep, cleaning, laundry, driving, shopping, community  activity, and occupation  PERSONAL FACTORS: Age, Fitness, Past/current experiences, Sex, Time since onset of injury/illness/exacerbation, and 1-2 comorbidities: osteoporosis, depression, anemia, chronic LUE pain  are also affecting patient's functional outcome.   REHAB POTENTIAL: Good  CLINICAL DECISION MAKING: Evolving/moderate complexity  EVALUATION COMPLEXITY: Moderate   GOALS: Goals reviewed with patient? Yes  SHORT TERM GOALS: Target date: 11/11/22  Pt will be independent with HEP in order to improve strength and balance in order to decrease fall risk and improve function at home and work.  Baseline: HEP given  Goal status: INITIAL    LONG TERM GOALS: Target date: 12/26/22  Patient will increase FOTO score to 68  to demonstrate predicted increase in functional mobility to complete ADLs Baseline: 44 Goal status: INITIAL  2.  Pt will decrease worst pain as reported on NPRS by at least 3 points in order to demonstrate clinically significant reduction in pain. Baseline: 10/10 Goal status: INITIAL  3.  Pt will demonstrate cervical rotation of at least 70d bilat in order to drive safely Baseline: R: 51 L 59 Goal status: INITIAL    PLAN:  PT FREQUENCY: 1-2x/week  PT DURATION: 8 weeks  PLANNED INTERVENTIONS: Therapeutic exercises, Therapeutic activity, Neuromuscular re-education, Balance training, Gait training, Patient/Family education, Self Care, Joint mobilization, Joint manipulation, Visual/preceptual remediation/compensation, Dry Needling, Electrical stimulation, Spinal manipulation, Spinal mobilization, Cryotherapy, Moist heat, Traction, Ultrasound, Ionotophoresis 4mg /ml Dexamethasone, Manual therapy, and Re-evaluation  PLAN FOR NEXT SESSION:  HEP review, increased shoulder stabilization exercises.   Nolon Bussing, PT, DPT Physical Therapist - [facility]  12/04/22, 5:15 PM

## 2022-12-06 DIAGNOSIS — F411 Generalized anxiety disorder: Secondary | ICD-10-CM | POA: Diagnosis not present

## 2022-12-06 DIAGNOSIS — F331 Major depressive disorder, recurrent, moderate: Secondary | ICD-10-CM | POA: Diagnosis not present

## 2022-12-11 ENCOUNTER — Ambulatory Visit: Payer: BC Managed Care – PPO

## 2022-12-11 DIAGNOSIS — G8929 Other chronic pain: Secondary | ICD-10-CM

## 2022-12-11 DIAGNOSIS — M25512 Pain in left shoulder: Secondary | ICD-10-CM | POA: Diagnosis not present

## 2022-12-11 NOTE — Therapy (Signed)
OUTPATIENT PHYSICAL THERAPY SHOULDER TREATMENT   Patient Name: Lauren Tanner MRN: [id number] DOB:Sep 18, 1955, 67 y.o., female Today's Date: 12/11/2022  END OF SESSION:  PT End of Session - 12/11/22 1538     Visit Number 8    Number of Visits 17    Date for PT Re-Evaluation 12/27/22    Authorization - Visit Number 8    Authorization - Number of Visits 10    Progress Note Due on Visit 10    PT Start Time 1537    PT Stop Time 1600    PT Time Calculation (min) 23 min    Activity Tolerance Patient tolerated treatment well    Behavior During Therapy [facility] for tasks assessed/performed               Past Medical History:  Diagnosis Date   Chronic interstitial cystitis 10/11/2011   Depression    no history of hospitalizations   Diverticulosis    Dysrhythmia    GERD (gastroesophageal reflux disease)    Headache disorder    managed with imipramine   History of colonic polyps 2011   last colonoscopy, [facility]   SVT (supraventricular tachycardia)    normal screening ER workup   Past Surgical History:  Procedure Laterality Date   COLONOSCOPY WITH PROPOFOL N/A 05/19/2015   Procedure: COLONOSCOPY WITH PROPOFOL;  Surgeon: Scot Jun, MD;  Location: [facility] ENDOSCOPY;  Service: Endoscopy;  Laterality: N/A;   COLONOSCOPY WITH PROPOFOL N/A 12/20/2019   Procedure: COLONOSCOPY WITH PROPOFOL;  Surgeon: Pasty Spillers, MD;  Location: [facility] ENDOSCOPY;  Service: Endoscopy;  Laterality: N/A;   COLONOSCOPY WITH PROPOFOL N/A 07/11/2020   Procedure: COLONOSCOPY WITH PROPOFOL;  Surgeon: Wyline Mood, MD;  Location: [facility] ENDOSCOPY;  Service: Endoscopy;  Laterality: N/A;   DILATION AND CURETTAGE OF UTERUS     Patient Active Problem List   Diagnosis Date Noted   Arm pain, diffuse, left 05/21/2022   Anemia 02/26/2022   Stage 3a chronic kidney disease (HCC) 02/22/2022   Bilateral bunions 11/22/2020   Bell's palsy 03/22/2020   Hyperlipidemia LDL goal <160 10/24/2019   Cervical  cancer screening 10/24/2019   Cervical disc disorder with radiculopathy of cervical region 03/14/2016   Encounter for preventive health examination 11/17/2012   Postmenopausal atrophic vaginitis 11/17/2012   GERD (gastroesophageal reflux disease)    Family history of colon cancer requiring screening colonoscopy    Osteoporosis, post-menopausal 10/11/2011   Uterine polyp 10/11/2011   Depression, major, in partial remission (HCC) 10/11/2011   Migraine headache 10/11/2011   Diverticulosis     PCP: Darrick Huntsman MD  REFERRING PROVIDER: Darrick Huntsman MD  REFERRING DIAG: L shoulder pain   THERAPY DIAG:  Chronic left shoulder pain  Rationale for Evaluation and Treatment: Rehabilitation  ONSET DATE: Nov 2023  SUBJECTIVE:  SUBJECTIVE STATEMENT:  Pt reports she was experiencing some discomfort in the shoulder during her last class of the day.  Pt notes that she does not utilize the L shoulder to write or anything, so unsure as to why it was hurting more.  Pt denies any pain upon arrival to the clinic however.   Hand dominance: Right  PERTINENT HISTORY:  Pt is a 67 year old female presenting with L shoulder pain with numbness in the finger tips, and electrical sensation from the inside her her arm beginning in her armpit down to the 3rd/4th/5th fingers. She has electrical sensations and finger tip numbness when she lays down to go to sleep at night, and no other time. Reports this pain wakes her from her sleep 80% of nights 1-2x/night. Reports pain may be worse with a lot of pulling activity "when I was pulling a hose once it worsened". Pain is worsened by sleeping on L side which is her typical sleeping position. 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 Occupation not inhibited by shoulder pain.  Current and best pain 1/10; worst 10/10- pain is burning, electric; alleviated by repositioning. Denies neck or head pain, or headaches. Pt denies N/V, B&B changes, unexplained weight fluctuation, saddle paresthesia, fever, night sweats, or unrelenting night pain at this time.   PAIN:  Are you having pain? Yes: NPRS scale: 0/10 Pain location: armpit to 3rd, 4th, 5th digit Pain description: burning/electric; short lasting Aggravating factors: sleeping (particularly on L side) Relieving factors: Re-positioning  PRECAUTIONS: None  WEIGHT BEARING RESTRICTIONS: No  FALLS:  Has patient fallen in last 6 months? Yes; walking in dark unfamiliar house, slipped on floor  LIVING ENVIRONMENT: Lives with: lives with their family Lives in: House/apartment Stairs: Yes: Internal: 4 steps; bilateral but cannot reach both and External: 12 steps; on right going up Has following equipment at home: None  OCCUPATION: Teacher at [employer]   PLOF: Independent  PATIENT GOALS:decrease pain to sleep   NEXT MD VISIT: none yet  OBJECTIVE:   DIAGNOSTIC FINDINGS:   CT neck: 1.  No abnormality seen along the brachial plexus. No apical lung mass. 2. Advanced cervical spine degeneration with left foraminal impingement at C5-6 and C6-7.  PATIENT SURVEYS:  FOTO 44 goal of 11  COGNITION: Overall cognitive status: Within functional limits for tasks assessed     SENSATION: WFL  POSTURE: FHRS, upper crossed Scapular dyskinesis with overhead mobility   UPPER EXTREMITY ROM:   Active ROM Right eval Left eval  Shoulder flexion WNL WNL  Shoulder extension WNL WNL  Shoulder abduction WNL WNL  Shoulder adduction    Shoulder internal rotation WNL/Apleys T10 WNL/Apleys T10  Shoulder external rotation WNL/Apleys CTJ WNL/Apleys CTJ  Elbow flexion WNL wnl  Elbow extension wnl wnl  Wrist flexion    Wrist extension    Wrist ulnar deviation    Wrist radial deviation    Wrist pronation    Wrist supination     (Blank rows = not tested)  Cervical:  Rotation R: 51 L 59 Lateral Flex R: 31 L 21 Ext and flex WNL  UPPER EXTREMITY MMT:  MMT Right eval Left eval  Shoulder flexion 5 5  Shoulder extension 5 5  Shoulder abduction 5 5  Shoulder adduction    Shoulder internal rotation 5 5  Shoulder external rotation 5 5  Middle trapezius 4+ 4+  Lower trapezius 4 4-  Elbow flexion    Elbow extension    Wrist  flexion    Wrist extension    Wrist ulnar deviation    Wrist radial deviation    Wrist pronation    Wrist supination    Grip strength (lbs)    (Blank rows = not tested)  SHOULDER SPECIAL TESTS: Impingement tests: Neer impingement test: negative, Hawkins/Kennedy impingement test: negative, Painful arc test: negative, and O' Briens test: negative  Rotator cuff assessment: Drop arm test: negative, Empty can test: negative, Full can test: negative, External rotation lag sign: negative, Internal rotation lag sign: negative, Gerber lift off test: negative, Belly press test: negative, and Infraspinatus test: negative  TOS Rhett Bannister and Delford Field all negative  Beighton 3/9 (palm to floor and bilat elbows)  CERVICAL SPECIAL TESTS:  Upper limb tension test (ULTT): Positive, Spurling's test: Negative, and Distraction test: Negative  JOINT MOBILITY TESTING:  First rib: WNL GHJ WNL all directions Scapular mobility WNL all directions with excessive protraction  Upper thoracic spine hypomobile throughout  PALPATION:  Tension with trigger points, pain to deep palpation of    TODAY'S TREATMENT: DATE: 12/11/22  TherEx:  Seated chest press at OMEGA, 20#, 2x10 Seated lat pull down at OMEGA, 20#, 2x10 Seated scapular retraction at OMEGA, 20#, 2x10 Standing high rows at OMEGA, 15#, 2x10 Standing body blades in forward flexion, overhead flexion, behind back into IR, with L UE and B UE, 30 sec bouts each direction   Manual:  Supine STM to cervical region to increase extensibility of the  paraspinals Supine suboccipital release technique to decrease cervicalgia Supine manual traction performed in order to increase joint space in cervical region for pain relief Supine cervical upglides/downglides, 30 sec bouts Supine UT/Levator stretch, 30 sec bouts to increase tissue extensibility of the cervical region      PATIENT EDUCATION: Education details: Patient was educated on diagnosis, anatomy and pathology involved, prognosis, role of PT, and was given an HEP, demonstrating exercise with proper form following verbal and tactile cues, and was given a paper hand out to continue exercise at home. Pt was educated on and agreed to plan of care.  Person educated: Patient Education method: Explanation, Demonstration, and Handouts Education comprehension: verbalized understanding and returned demonstration  HOME EXERCISE PROGRAM: [id number]  ASSESSMENT:  CLINICAL IMPRESSION:  Pt checked in late for appointment, therefore time was limited, however patient participated in strengthening of the UE's in order to improve pain and mobility of the L shoulder.  Pt has been steadily improving, with pt and therapist discussing d/c options.  Pt and therapist agreed to discharging at 10th visit at this time.  Will continue to monitor response to therapy and improve strengthening necessary for return to PLOF.   Pt will continue to benefit from skilled therapy to address remaining deficits in order to improve overall QoL.      OBJECTIVE IMPAIRMENTS: decreased activity tolerance, decreased endurance, decreased mobility, decreased ROM, decreased strength, increased fascial restrictions, impaired flexibility, impaired sensation, impaired UE functional use, improper body mechanics, postural dysfunction, and pain.   ACTIVITY LIMITATIONS: carrying, lifting, sleeping, and transfers  PARTICIPATION LIMITATIONS: meal prep, cleaning, laundry, driving, shopping, community activity, and occupation  PERSONAL  FACTORS: Age, Fitness, Past/current experiences, Sex, Time since onset of injury/illness/exacerbation, and 1-2 comorbidities: osteoporosis, depression, anemia, chronic LUE pain  are also affecting patient's functional outcome.   REHAB POTENTIAL: Good  CLINICAL DECISION MAKING: Evolving/moderate complexity  EVALUATION COMPLEXITY: Moderate   GOALS: Goals reviewed with patient? Yes  SHORT TERM GOALS: Target date: 11/11/22  Pt will be independent with  HEP in order to improve strength and balance in order to decrease fall risk and improve function at home and work.  Baseline: HEP given  Goal status: INITIAL    LONG TERM GOALS: Target date: 12/26/22  Patient will increase FOTO score to 68  to demonstrate predicted increase in functional mobility to complete ADLs Baseline: 44 Goal status: INITIAL  2.  Pt will decrease worst pain as reported on NPRS by at least 3 points in order to demonstrate clinically significant reduction in pain. Baseline: 10/10 Goal status: INITIAL  3.  Pt will demonstrate cervical rotation of at least 70d bilat in order to drive safely Baseline: R: 51 L 59 Goal status: INITIAL    PLAN:  PT FREQUENCY: 1-2x/week  PT DURATION: 8 weeks  PLANNED INTERVENTIONS: Therapeutic exercises, Therapeutic activity, Neuromuscular re-education, Balance training, Gait training, Patient/Family education, Self Care, Joint mobilization, Joint manipulation, Visual/preceptual remediation/compensation, Dry Needling, Electrical stimulation, Spinal manipulation, Spinal mobilization, Cryotherapy, Moist heat, Traction, Ultrasound, Ionotophoresis 4mg /ml Dexamethasone, Manual therapy, and Re-evaluation  PLAN FOR NEXT SESSION:  HEP review, increased shoulder stabilization exercises.   Nolon Bussing, PT, DPT Physical Therapist - [facility]  12/11/22, 5:09 PM

## 2022-12-18 ENCOUNTER — Ambulatory Visit: Payer: BC Managed Care – PPO

## 2022-12-18 DIAGNOSIS — M25512 Pain in left shoulder: Secondary | ICD-10-CM | POA: Diagnosis not present

## 2022-12-18 DIAGNOSIS — G8929 Other chronic pain: Secondary | ICD-10-CM

## 2022-12-18 NOTE — Therapy (Signed)
OUTPATIENT PHYSICAL THERAPY SHOULDER TREATMENT   Patient Name: Kursten Finnegan MRN: [id number] DOB:1955-11-13, 67 y.o., female Today's Date: 12/18/2022  END OF SESSION:  PT End of Session - 12/18/22 1518     Visit Number 9    Number of Visits 17    Date for PT Re-Evaluation 12/27/22    Authorization - Visit Number 9    Authorization - Number of Visits 10    Progress Note Due on Visit 10    PT Start Time 1518    PT Stop Time 1600    PT Time Calculation (min) 42 min    Activity Tolerance Patient tolerated treatment well    Behavior During Therapy [facility] for tasks assessed/performed               Past Medical History:  Diagnosis Date   Chronic interstitial cystitis 10/11/2011   Depression    no history of hospitalizations   Diverticulosis    Dysrhythmia    GERD (gastroesophageal reflux disease)    Headache disorder    managed with imipramine   History of colonic polyps 2011   last colonoscopy, [facility]   SVT (supraventricular tachycardia)    normal screening ER workup   Past Surgical History:  Procedure Laterality Date   COLONOSCOPY WITH PROPOFOL N/A 05/19/2015   Procedure: COLONOSCOPY WITH PROPOFOL;  Surgeon: Scot Jun, MD;  Location: [facility] ENDOSCOPY;  Service: Endoscopy;  Laterality: N/A;   COLONOSCOPY WITH PROPOFOL N/A 12/20/2019   Procedure: COLONOSCOPY WITH PROPOFOL;  Surgeon: Pasty Spillers, MD;  Location: [facility] ENDOSCOPY;  Service: Endoscopy;  Laterality: N/A;   COLONOSCOPY WITH PROPOFOL N/A 07/11/2020   Procedure: COLONOSCOPY WITH PROPOFOL;  Surgeon: Wyline Mood, MD;  Location: [facility] ENDOSCOPY;  Service: Endoscopy;  Laterality: N/A;   DILATION AND CURETTAGE OF UTERUS     Patient Active Problem List   Diagnosis Date Noted   Arm pain, diffuse, left 05/21/2022   Anemia 02/26/2022   Stage 3a chronic kidney disease (HCC) 02/22/2022   Bilateral bunions 11/22/2020   Bell's palsy 03/22/2020   Hyperlipidemia LDL goal <160 10/24/2019   Cervical  cancer screening 10/24/2019   Cervical disc disorder with radiculopathy of cervical region 03/14/2016   Encounter for preventive health examination 11/17/2012   Postmenopausal atrophic vaginitis 11/17/2012   GERD (gastroesophageal reflux disease)    Family history of colon cancer requiring screening colonoscopy    Osteoporosis, post-menopausal 10/11/2011   Uterine polyp 10/11/2011   Depression, major, in partial remission (HCC) 10/11/2011   Migraine headache 10/11/2011   Diverticulosis     PCP: Darrick Huntsman MD  REFERRING PROVIDER: Darrick Huntsman MD  REFERRING DIAG: L shoulder pain   THERAPY DIAG:  Chronic left shoulder pain  Rationale for Evaluation and Treatment: Rehabilitation  ONSET DATE: Nov 2023  SUBJECTIVE:  SUBJECTIVE STATEMENT:  Pt reports she was feeling some increased tightness along the L scapular region where she had a prior surgery following the last treatment session.  Pt notes she would like to continue the exercises, just may need to do less weight in order to not cause another flare up.    Hand dominance: Right  PERTINENT HISTORY:  Pt is a 67 year old female presenting with L shoulder pain with numbness in the finger tips, and electrical sensation from the inside her her arm beginning in her armpit down to the 3rd/4th/5th fingers. She has electrical sensations and finger tip numbness when she lays down to go to sleep at night, and no other time. Reports this pain wakes her from her sleep 80% of nights 1-2x/night. Reports pain may be worse with a lot of pulling activity "when I was pulling a hose once it worsened". Pain is worsened by sleeping on L side which is her typical sleeping position. 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 Occupation not inhibited by shoulder  pain. Current and best pain 1/10; worst 10/10- pain is burning, electric; alleviated by repositioning. Denies neck or head pain, or headaches. Pt denies N/V, B&B changes, unexplained weight fluctuation, saddle paresthesia, fever, night sweats, or unrelenting night pain at this time.   PAIN:  Are you having pain? Yes: NPRS scale: 0/10 Pain location: armpit to 3rd, 4th, 5th digit Pain description: burning/electric; short lasting Aggravating factors: sleeping (particularly on L side) Relieving factors: Re-positioning  PRECAUTIONS: None  WEIGHT BEARING RESTRICTIONS: No  FALLS:  Has patient fallen in last 6 months? Yes; walking in dark unfamiliar house, slipped on floor  LIVING ENVIRONMENT: Lives with: lives with their family Lives in: House/apartment Stairs: Yes: Internal: 4 steps; bilateral but cannot reach both and External: 12 steps; on right going up Has following equipment at home: None  OCCUPATION: Teacher at [employer]   PLOF: Independent  PATIENT GOALS:decrease pain to sleep   NEXT MD VISIT: none yet  OBJECTIVE:   DIAGNOSTIC FINDINGS:   CT neck: 1.  No abnormality seen along the brachial plexus. No apical lung mass. 2. Advanced cervical spine degeneration with left foraminal impingement at C5-6 and C6-7.  PATIENT SURVEYS:  FOTO 44 goal of 39  COGNITION: Overall cognitive status: Within functional limits for tasks assessed     SENSATION: WFL  POSTURE: FHRS, upper crossed Scapular dyskinesis with overhead mobility   UPPER EXTREMITY ROM:   Active ROM Right eval Left eval  Shoulder flexion WNL WNL  Shoulder extension WNL WNL  Shoulder abduction WNL WNL  Shoulder adduction    Shoulder internal rotation WNL/Apleys T10 WNL/Apleys T10  Shoulder external rotation WNL/Apleys CTJ WNL/Apleys CTJ  Elbow flexion WNL wnl  Elbow extension wnl wnl  Wrist flexion    Wrist extension    Wrist ulnar deviation    Wrist radial deviation    Wrist pronation    Wrist  supination    (Blank rows = not tested)  Cervical:  Rotation R: 51 L 59 Lateral Flex R: 31 L 21 Ext and flex WNL  UPPER EXTREMITY MMT:  MMT Right eval Left eval  Shoulder flexion 5 5  Shoulder extension 5 5  Shoulder abduction 5 5  Shoulder adduction    Shoulder internal rotation 5 5  Shoulder external rotation 5 5  Middle trapezius 4+ 4+  Lower trapezius 4 4-  Elbow flexion    Elbow extension    Wrist flexion  Wrist extension    Wrist ulnar deviation    Wrist radial deviation    Wrist pronation    Wrist supination    Grip strength (lbs)    (Blank rows = not tested)  SHOULDER SPECIAL TESTS: Impingement tests: Neer impingement test: negative, Hawkins/Kennedy impingement test: negative, Painful arc test: negative, and O' Briens test: negative  Rotator cuff assessment: Drop arm test: negative, Empty can test: negative, Full can test: negative, External rotation lag sign: negative, Internal rotation lag sign: negative, Gerber lift off test: negative, Belly press test: negative, and Infraspinatus test: negative  TOS Rhett Bannister and Delford Field all negative  Beighton 3/9 (palm to floor and bilat elbows)  CERVICAL SPECIAL TESTS:  Upper limb tension test (ULTT): Positive, Spurling's test: Negative, and Distraction test: Negative  JOINT MOBILITY TESTING:  First rib: WNL GHJ WNL all directions Scapular mobility WNL all directions with excessive protraction  Upper thoracic spine hypomobile throughout  PALPATION:  Tension with trigger points, pain to deep palpation of    TODAY'S TREATMENT: DATE: 12/18/22  TherEx:  Seated chest press at OMEGA, 15#, 2x10 Seated lat pull down at OMEGA, 20#, 2x10 Seated scapular retraction at OMEGA, 20#, 2x10 Standing high rows at OMEGA, 15#, 2x10 Standing lateral walk outs arm put into IR/ER, with GTB resistance, x10 Standing chain saw pulls (incline scapular retraction) 10# KB, 2x10 each UE  Standing TRX push ups, x10 Standing TRX  pull ups, x10    Manual:  Prone STM to L sided UT, subscapularis, supraspinatus, infraspinatus, and L thoracic/cervical paraspinals for pain reduction      PATIENT EDUCATION: Education details: Patient was educated on diagnosis, anatomy and pathology involved, prognosis, role of PT, and was given an HEP, demonstrating exercise with proper form following verbal and tactile cues, and was given a paper hand out to continue exercise at home. Pt was educated on and agreed to plan of care.  Person educated: Patient Education method: Explanation, Demonstration, and Handouts Education comprehension: verbalized understanding and returned demonstration  HOME EXERCISE PROGRAM: [id number]  ASSESSMENT:  CLINICAL IMPRESSION:  Pt continues to perform well with all tasks and is improving with therapy.  Pt is approaching planned discharge date as well.  Pt continues to demonstrate improved ROM and will continue to perform HEP in order to maintain improved ROM while also improving strength.   Pt will continue to benefit from skilled therapy to address remaining deficits in order to improve overall QoL and return to PLOF.         OBJECTIVE IMPAIRMENTS: decreased activity tolerance, decreased endurance, decreased mobility, decreased ROM, decreased strength, increased fascial restrictions, impaired flexibility, impaired sensation, impaired UE functional use, improper body mechanics, postural dysfunction, and pain.   ACTIVITY LIMITATIONS: carrying, lifting, sleeping, and transfers  PARTICIPATION LIMITATIONS: meal prep, cleaning, laundry, driving, shopping, community activity, and occupation  PERSONAL FACTORS: Age, Fitness, Past/current experiences, Sex, Time since onset of injury/illness/exacerbation, and 1-2 comorbidities: osteoporosis, depression, anemia, chronic LUE pain  are also affecting patient's functional outcome.   REHAB POTENTIAL: Good  CLINICAL DECISION MAKING: Evolving/moderate  complexity  EVALUATION COMPLEXITY: Moderate   GOALS: Goals reviewed with patient? Yes  SHORT TERM GOALS: Target date: 11/11/22  Pt will be independent with HEP in order to improve strength and balance in order to decrease fall risk and improve function at home and work.  Baseline: HEP given  Goal status: INITIAL    LONG TERM GOALS: Target date: 12/26/22  Patient will increase FOTO score to 68  to  demonstrate predicted increase in functional mobility to complete ADLs Baseline: 44 Goal status: INITIAL  2.  Pt will decrease worst pain as reported on NPRS by at least 3 points in order to demonstrate clinically significant reduction in pain. Baseline: 10/10 Goal status: INITIAL  3.  Pt will demonstrate cervical rotation of at least 70d bilat in order to drive safely Baseline: R: 51 L 59 Goal status: INITIAL    PLAN:  PT FREQUENCY: 1-2x/week  PT DURATION: 8 weeks  PLANNED INTERVENTIONS: Therapeutic exercises, Therapeutic activity, Neuromuscular re-education, Balance training, Gait training, Patient/Family education, Self Care, Joint mobilization, Joint manipulation, Visual/preceptual remediation/compensation, Dry Needling, Electrical stimulation, Spinal manipulation, Spinal mobilization, Cryotherapy, Moist heat, Traction, Ultrasound, Ionotophoresis 4mg /ml Dexamethasone, Manual therapy, and Re-evaluation  PLAN FOR NEXT SESSION:  HEP review, increased shoulder stabilization exercises.   Nolon Bussing, PT, DPT Physical Therapist - [facility]  12/18/22, 5:37 PM

## 2022-12-25 ENCOUNTER — Ambulatory Visit: Payer: BC Managed Care – PPO

## 2022-12-25 DIAGNOSIS — M25512 Pain in left shoulder: Secondary | ICD-10-CM | POA: Diagnosis not present

## 2022-12-25 DIAGNOSIS — G8929 Other chronic pain: Secondary | ICD-10-CM

## 2022-12-25 NOTE — Therapy (Signed)
OUTPATIENT PHYSICAL THERAPY SHOULDER TREATMENT   Patient Name: Lauren Tanner MRN: [id number] DOB:[birth year], 66 y.o., female Today's Date: 12/25/2022  END OF SESSION:  PT End of Session - 12/25/22 1519     Visit Number 10    Number of Visits 17    Date for PT Re-Evaluation 12/27/22    Authorization - Visit Number 10    Authorization - Number of Visits 10    Progress Note Due on Visit 10    PT Start Time 1518    PT Stop Time 1540    PT Time Calculation (min) 22 min    Activity Tolerance Patient tolerated treatment well    Behavior During Therapy [facility] for tasks assessed/performed               Past Medical History:  Diagnosis Date   Chronic interstitial cystitis 10/11/2011   Depression    no history of hospitalizations   Diverticulosis    Dysrhythmia    GERD (gastroesophageal reflux disease)    Headache disorder    managed with imipramine   History of colonic polyps 2011   last colonoscopy, [facility]   SVT (supraventricular tachycardia)    normal screening ER workup   Past Surgical History:  Procedure Laterality Date   COLONOSCOPY WITH PROPOFOL N/A 05/19/2015   Procedure: COLONOSCOPY WITH PROPOFOL;  Surgeon: Scot Jun, MD;  Location: [facility] ENDOSCOPY;  Service: Endoscopy;  Laterality: N/A;   COLONOSCOPY WITH PROPOFOL N/A 12/20/2019   Procedure: COLONOSCOPY WITH PROPOFOL;  Surgeon: Pasty Spillers, MD;  Location: [facility] ENDOSCOPY;  Service: Endoscopy;  Laterality: N/A;   COLONOSCOPY WITH PROPOFOL N/A 07/11/2020   Procedure: COLONOSCOPY WITH PROPOFOL;  Surgeon: Wyline Mood, MD;  Location: [facility] ENDOSCOPY;  Service: Endoscopy;  Laterality: N/A;   DILATION AND CURETTAGE OF UTERUS     Patient Active Problem List   Diagnosis Date Noted   Arm pain, diffuse, left 05/21/2022   Anemia 02/26/2022   Stage 3a chronic kidney disease (HCC) 02/22/2022   Bilateral bunions 11/22/2020   Bell's palsy 03/22/2020   Hyperlipidemia LDL goal <160 10/24/2019   Cervical  cancer screening 10/24/2019   Cervical disc disorder with radiculopathy of cervical region 03/14/2016   Encounter for preventive health examination 11/17/2012   Postmenopausal atrophic vaginitis 11/17/2012   GERD (gastroesophageal reflux disease)    Family history of colon cancer requiring screening colonoscopy    Osteoporosis, post-menopausal 10/11/2011   Uterine polyp 10/11/2011   Depression, major, in partial remission (HCC) 10/11/2011   Migraine headache 10/11/2011   Diverticulosis     PCP: Darrick Huntsman MD  REFERRING PROVIDER: Darrick Huntsman MD  REFERRING DIAG: L shoulder pain   THERAPY DIAG:  Chronic left shoulder pain  Rationale for Evaluation and Treatment: Rehabilitation  ONSET DATE: Nov 2023  SUBJECTIVE:  SUBJECTIVE STATEMENT:  Pt states she had a rough end to the semester with being late grading papers and submitting grades due to having to do more than expected.     Hand dominance: Right  PERTINENT HISTORY:  Pt is a 67 year old female presenting with L shoulder pain with numbness in the finger tips, and electrical sensation from the inside her her arm beginning in her armpit down to the 3rd/4th/5th fingers. She has electrical sensations and finger tip numbness when she lays down to go to sleep at night, and no other time. Reports this pain wakes her from her sleep 80% of nights 1-2x/night. Reports pain may be worse with a lot of pulling activity "when I was pulling a hose once it worsened". Pain is worsened by sleeping on L side which is her typical sleeping position. 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 Occupation not inhibited by shoulder pain. Current and best pain 1/10; worst 10/10- pain is burning, electric; alleviated by repositioning. Denies neck or head pain, or  headaches. Pt denies N/V, B&B changes, unexplained weight fluctuation, saddle paresthesia, fever, night sweats, or unrelenting night pain at this time.   PAIN:  Are you having pain? Yes: NPRS scale: 0/10 Pain location: armpit to 3rd, 4th, 5th digit Pain description: burning/electric; short lasting Aggravating factors: sleeping (particularly on L side) Relieving factors: Re-positioning  PRECAUTIONS: None  WEIGHT BEARING RESTRICTIONS: No  FALLS:  Has patient fallen in last 6 months? Yes; walking in dark unfamiliar house, slipped on floor  LIVING ENVIRONMENT: Lives with: lives with their family Lives in: House/apartment Stairs: Yes: Internal: 4 steps; bilateral but cannot reach both and External: 12 steps; on right going up Has following equipment at home: None  OCCUPATION: Teacher at [employer]   PLOF: Independent  PATIENT GOALS:decrease pain to sleep   NEXT MD VISIT: none yet  OBJECTIVE:   DIAGNOSTIC FINDINGS:   CT neck: 1.  No abnormality seen along the brachial plexus. No apical lung mass. 2. Advanced cervical spine degeneration with left foraminal impingement at C5-6 and C6-7.  PATIENT SURVEYS:  FOTO 44 goal of 52  COGNITION: Overall cognitive status: Within functional limits for tasks assessed     SENSATION: WFL  POSTURE: FHRS, upper crossed Scapular dyskinesis with overhead mobility   UPPER EXTREMITY ROM:   Active ROM Right eval Left eval  Shoulder flexion WNL WNL  Shoulder extension WNL WNL  Shoulder abduction WNL WNL  Shoulder adduction    Shoulder internal rotation WNL/Apleys T10 WNL/Apleys T10  Shoulder external rotation WNL/Apleys CTJ WNL/Apleys CTJ  Elbow flexion WNL wnl  Elbow extension wnl wnl  Wrist flexion    Wrist extension    Wrist ulnar deviation    Wrist radial deviation    Wrist pronation    Wrist supination    (Blank rows = not tested)  Cervical:  Rotation R: 51 L 59 Lateral Flex R: 31 L 21 Ext and flex WNL  UPPER EXTREMITY  MMT:  MMT Right eval Left eval  Shoulder flexion 5 5  Shoulder extension 5 5  Shoulder abduction 5 5  Shoulder adduction    Shoulder internal rotation 5 5  Shoulder external rotation 5 5  Middle trapezius 4+ 4+  Lower trapezius 4 4-  Elbow flexion    Elbow extension    Wrist flexion    Wrist extension    Wrist ulnar deviation    Wrist radial deviation    Wrist pronation  Wrist supination    Grip strength (lbs)    (Blank rows = not tested)  SHOULDER SPECIAL TESTS: Impingement tests: Neer impingement test: negative, Hawkins/Kennedy impingement test: negative, Painful arc test: negative, and O' Briens test: negative  Rotator cuff assessment: Drop arm test: negative, Empty can test: negative, Full can test: negative, External rotation lag sign: negative, Internal rotation lag sign: negative, Gerber lift off test: negative, Belly press test: negative, and Infraspinatus test: negative  TOS Rhett Bannister and Delford Field all negative  Beighton 3/9 (palm to floor and bilat elbows)  CERVICAL SPECIAL TESTS:  Upper limb tension test (ULTT): Positive, Spurling's test: Negative, and Distraction test: Negative  JOINT MOBILITY TESTING:  First rib: WNL GHJ WNL all directions Scapular mobility WNL all directions with excessive protraction  Upper thoracic spine hypomobile throughout  PALPATION:  Tension with trigger points, pain to deep palpation of    TODAY'S TREATMENT: DATE: 12/25/22  TherEx:   Goal assessment performed and noted below:     PATIENT EDUCATION: Education details: Patient was educated on diagnosis, anatomy and pathology involved, prognosis, role of PT, and was given an HEP, demonstrating exercise with proper form following verbal and tactile cues, and was given a paper hand out to continue exercise at home. Pt was educated on and agreed to plan of care.  Person educated: Patient Education method: Explanation, Demonstration, and Handouts Education comprehension:  verbalized understanding and returned demonstration  HOME EXERCISE PROGRAM: [phone]  ASSESSMENT:  CLINICAL IMPRESSION:  Pt self-reports she has made significant progress and believes that had she not come to therapy, she would be in a lot worse shape.  Pt did not meet her goals at this time and has made minimal progress and has even declined in FOTO score and ROM goals.  Pt notes that her pain is much better, improving throughout the day, only having increased pain at night.  Pt declined extending therapy at this time although goals had not been achieved, pt feels as though she is doing much better.  Pt advised to contact the clinic if any questions/needs arise.  Pt is d/c at this time.       OBJECTIVE IMPAIRMENTS: decreased activity tolerance, decreased endurance, decreased mobility, decreased ROM, decreased strength, increased fascial restrictions, impaired flexibility, impaired sensation, impaired UE functional use, improper body mechanics, postural dysfunction, and pain.   ACTIVITY LIMITATIONS: carrying, lifting, sleeping, and transfers  PARTICIPATION LIMITATIONS: meal prep, cleaning, laundry, driving, shopping, community activity, and occupation  PERSONAL FACTORS: Age, Fitness, Past/current experiences, Sex, Time since onset of injury/illness/exacerbation, and 1-2 comorbidities: osteoporosis, depression, anemia, chronic LUE pain  are also affecting patient's functional outcome.   REHAB POTENTIAL: Good  CLINICAL DECISION MAKING: Evolving/moderate complexity  EVALUATION COMPLEXITY: Moderate   GOALS: Goals reviewed with patient? Yes  SHORT TERM GOALS: Target date: 11/11/22  Pt will be independent with HEP in order to improve strength and balance in order to decrease fall risk and improve function at home and work.  Baseline: HEP given  Goal status: INITIAL    LONG TERM GOALS: Target date: 12/26/22  Patient will increase FOTO score to 68  to demonstrate predicted increase  in functional mobility to complete ADLs Baseline: 44 12/25/22: 60 Goal status: INITIAL  2.  Pt will decrease worst pain as reported on NPRS by at least 3 points in order to demonstrate clinically significant reduction in pain. Baseline: 10/10 12/25/22: 8/10 Goal status: NOT MET  3.  Pt will demonstrate cervical rotation of at least 70d bilat  in order to drive safely Baseline: R: 51 L 59 12/25/22: R: 46 L: 42 Goal status: NOT MET    PLAN:  PT FREQUENCY: 1-2x/week  PT DURATION: 8 weeks  PLANNED INTERVENTIONS: Therapeutic exercises, Therapeutic activity, Neuromuscular re-education, Balance training, Gait training, Patient/Family education, Self Care, Joint mobilization, Joint manipulation, Visual/preceptual remediation/compensation, Dry Needling, Electrical stimulation, Spinal manipulation, Spinal mobilization, Cryotherapy, Moist heat, Traction, Ultrasound, Ionotophoresis 4mg /ml Dexamethasone, Manual therapy, and Re-evaluation  PLAN FOR NEXT SESSION:  D/C at this time.   Nolon Bussing, PT, DPT Physical Therapist - [facility]  12/25/22, 3:36 PM

## 2022-12-31 DIAGNOSIS — G43719 Chronic migraine without aura, intractable, without status migrainosus: Secondary | ICD-10-CM | POA: Diagnosis not present

## 2022-12-31 DIAGNOSIS — M542 Cervicalgia: Secondary | ICD-10-CM | POA: Diagnosis not present

## 2023-01-15 ENCOUNTER — Ambulatory Visit: Payer: BC Managed Care – PPO | Admitting: Family Medicine

## 2023-01-15 ENCOUNTER — Ambulatory Visit (INDEPENDENT_AMBULATORY_CARE_PROVIDER_SITE_OTHER): Payer: BC Managed Care – PPO

## 2023-01-15 VITALS — BP 118/72 | HR 80 | Temp 98.0°F | Resp 16 | Ht 66.0 in | Wt 128.0 lb

## 2023-01-15 DIAGNOSIS — M25532 Pain in left wrist: Secondary | ICD-10-CM | POA: Diagnosis not present

## 2023-01-15 DIAGNOSIS — M79602 Pain in left arm: Secondary | ICD-10-CM | POA: Diagnosis not present

## 2023-01-15 MED ORDER — GABAPENTIN 300 MG PO CAPS: 600.0000 mg | ORAL_CAPSULE | Freq: Every day | ORAL | 1 refills | Status: DC

## 2023-01-15 NOTE — Progress Notes (Signed)
   SUBJECTIVE:   Chief Complaint  Patient presents with   Arm Pain   HPI Patient presents to clinic for left arm pain.  Chronic issue Sweeping driveway 4 days ago and had pain in left wrist.  Monday symptoms worsen.  Noticed worsening pain thumb and index finger of left hand.  Denies worsening numbness and tingling.  Prescribed gabapentin 200 mg 3 times daily, takes 300 mg at night.Lauren Tanner similar symptoms in past however seems a little different this time given pain now more isolated to left wrist versus extending upward to left elbow.  PERTINENT PMH / PSH: Chronic left arm pain Chronic shoulder pain  OBJECTIVE:  BP 118/72   Pulse 80   Temp 98 F (36.7 C)   Resp 16   Ht 5\' 6"  (1.676 m)   Wt 128 lb (58.1 kg)   SpO2 99%   BMI 20.66 kg/m    Physical Exam Constitutional:      General: She is not in acute distress.    Appearance: She is not ill-appearing or toxic-appearing.  Musculoskeletal:     Right hand: No bony tenderness. Normal sensation. Normal capillary refill. Normal pulse.     Left hand: Tenderness present. No swelling. Normal range of motion. Normal capillary refill. Normal pulse.     Comments: Left CMC joint laxity  Neurological:     Mental Status: She is alert.     ASSESSMENT/PLAN:  Left wrist pain Assessment & Plan: Acute on chronic.  Suspect repetitive motion causing increased pain. Low suspicion for fracture but given laxity of left CMC will obtain wrist x-ray Can take gabapentin 600 mg nightly Can use Voltaren gel 4 times a day as needed Tylenol 650 mg three times a day  Limit movement, no repetitive motion or heavy lifting. Recommend she follow-up with PCP if no improvement in symptoms.  Orders: -     DG Wrist Complete Left; Future -     Gabapentin; Take 2 capsules (600 mg total) by mouth at bedtime.  Dispense: 60 capsule; Refill: 1  Arm pain, diffuse, left -     Gabapentin; Take 2 capsules (600 mg total) by mouth at bedtime.  Dispense: 60  capsule; Refill: 1   PDMP reviewed  Return in about 2 months (around 03/17/2023) for PCP.  Dana Allan, MD

## 2023-01-15 NOTE — Patient Instructions (Signed)
It was a pleasure meeting you today. Thank you for allowing me to take part in your health care.  Our goals for today as we discussed include:  Increase Gabapentin to 600 mg at night  Will get xray of left wrist today  Can use Voltaren gel 4 times a day as needed Tylenol 650 mg three times a day  Limit movement, no repetitive motion or heavy lifting.   If you have any questions or concerns, please do not hesitate to call the office at [phone].  I look forward to our next visit and until then take care and stay safe.  Regards,   Dana Allan, MD   [facility]

## 2023-01-17 ENCOUNTER — Telehealth: Payer: Self-pay | Admitting: Internal Medicine

## 2023-01-17 NOTE — Telephone Encounter (Signed)
Pt was advised to be seen today at [facility]. Pt stated that she would try to go to UC but then her husband called to schedule an appointment for Monday. Is pt okay to wait until Monday? Pt fell out of the bed last night and now she is having headaches and nausea.

## 2023-01-17 NOTE — Telephone Encounter (Signed)
Pt husband called in stating that pt fell off the bed yesterday around 4:30am and hit her head. He stated that pt did not loose consciousness, however, today she's feeling nauseas, not eating, headaches, and its feeling horrible.  Call was transferred to Access Nurse.Marland Kitchen

## 2023-01-17 NOTE — Telephone Encounter (Signed)
Pt husband called back to make an appointment for Monday for the pt. Pt husband told me she was having issues before she hit her head

## 2023-01-17 NOTE — Telephone Encounter (Signed)
Spoke with pt's husband and he stated that he is taking pt to UC this afternoon but would like to keep the appt with Dr. Clent Ridges on Monday for a follow up.

## 2023-01-17 NOTE — Telephone Encounter (Signed)
She needs to be seen today for this.

## 2023-01-17 NOTE — Telephone Encounter (Signed)
Access Nurse called back stating that pt needs to be seen within 4 hrs. We do not have any available openings for today. Nurse said she will try UC or Ed for pt.

## 2023-01-19 ENCOUNTER — Encounter: Payer: Self-pay | Admitting: Family Medicine

## 2023-01-19 DIAGNOSIS — M25532 Pain in left wrist: Secondary | ICD-10-CM | POA: Insufficient documentation

## 2023-01-19 NOTE — Assessment & Plan Note (Addendum)
Acute on chronic.  Suspect repetitive motion causing increased pain. Low suspicion for fracture but given laxity of left CMC will obtain wrist x-ray Can take gabapentin 600 mg nightly Can use Voltaren gel 4 times a day as needed Tylenol 650 mg three times a day  Limit movement, no repetitive motion or heavy lifting. Recommend she follow-up with PCP if no improvement in symptoms.

## 2023-01-20 ENCOUNTER — Ambulatory Visit: Payer: BC Managed Care – PPO | Admitting: Family Medicine

## 2023-01-20 ENCOUNTER — Encounter: Payer: Self-pay | Admitting: Family Medicine

## 2023-01-20 VITALS — BP 118/78 | HR 87 | Temp 98.5°F | Ht 66.0 in | Wt 127.0 lb

## 2023-01-20 DIAGNOSIS — D72820 Lymphocytosis (symptomatic): Secondary | ICD-10-CM | POA: Diagnosis not present

## 2023-01-20 DIAGNOSIS — R748 Abnormal levels of other serum enzymes: Secondary | ICD-10-CM

## 2023-01-20 DIAGNOSIS — S70362A Insect bite (nonvenomous), left thigh, initial encounter: Secondary | ICD-10-CM

## 2023-01-20 DIAGNOSIS — R531 Weakness: Secondary | ICD-10-CM | POA: Diagnosis not present

## 2023-01-20 DIAGNOSIS — W57XXXA Bitten or stung by nonvenomous insect and other nonvenomous arthropods, initial encounter: Secondary | ICD-10-CM | POA: Diagnosis not present

## 2023-01-20 LAB — COMPREHENSIVE METABOLIC PANEL
ALT: 168 U/L — ABNORMAL HIGH (ref 0–35)
AST: 108 U/L — ABNORMAL HIGH (ref 0–37)
Albumin: 3.7 g/dL (ref 3.5–5.2)
Alkaline Phosphatase: 121 U/L — ABNORMAL HIGH (ref 39–117)
BUN: 19 mg/dL (ref 6–23)
CO2: 29 mEq/L (ref 19–32)
Calcium: 9.4 mg/dL (ref 8.4–10.5)
Chloride: 103 mEq/L (ref 96–112)
Creatinine, Ser: 0.87 mg/dL (ref 0.40–1.20)
GFR: 69.57 mL/min (ref >60.00)
Glucose, Bld: 95 mg/dL (ref 70–99)
Potassium: 4.2 mEq/L (ref 3.5–5.1)
Sodium: 138 mEq/L (ref 135–145)
Total Bilirubin: 0.3 mg/dL (ref 0.2–1.2)
Total Protein: 6.3 g/dL (ref 6.0–8.3)

## 2023-01-20 LAB — SEDIMENTATION RATE: Sed Rate: 3 mm/hr (ref 0–30)

## 2023-01-20 LAB — CBC WITH DIFFERENTIAL/PLATELET
Basophils Absolute: 0 10*3/uL (ref 0.0–0.1)
Basophils Relative: 0.4 % (ref 0.0–3.0)
Eosinophils Absolute: 0 10*3/uL (ref 0.0–0.7)
Eosinophils Relative: 0 % (ref 0.0–5.0)
HCT: 32.2 % — ABNORMAL LOW (ref 36.0–46.0)
Hemoglobin: 10.9 g/dL — ABNORMAL LOW (ref 12.0–15.0)
Lymphocytes Relative: 69.1 % — ABNORMAL HIGH (ref 12.0–46.0)
Lymphs Abs: 6.1 10*3/uL — ABNORMAL HIGH (ref 0.7–4.0)
MCHC: 33.9 g/dL (ref 30.0–36.0)
MCV: 94.6 fl (ref 78.0–100.0)
Monocytes Absolute: 0.5 10*3/uL (ref 0.1–1.0)
Monocytes Relative: 6.2 % (ref 3.0–12.0)
Neutro Abs: 2.1 10*3/uL (ref 1.4–7.7)
Neutrophils Relative %: 24.3 % — ABNORMAL LOW (ref 43.0–77.0)
Platelets: 222 10*3/uL (ref 150.0–400.0)
RBC: 3.4 Mil/uL — ABNORMAL LOW (ref 3.87–5.11)
RDW: 13 % (ref 11.5–15.5)
WBC: 8.8 10*3/uL (ref 4.0–10.5)

## 2023-01-20 LAB — URINALYSIS, ROUTINE W REFLEX MICROSCOPIC
Bilirubin Urine: NEGATIVE
Hgb urine dipstick: NEGATIVE
Ketones, ur: NEGATIVE
Leukocytes,Ua: NEGATIVE
Nitrite: NEGATIVE
RBC / HPF: NONE SEEN (ref 0–?)
Specific Gravity, Urine: 1.02 (ref 1.000–1.030)
Total Protein, Urine: NEGATIVE
Urine Glucose: NEGATIVE
Urobilinogen, UA: 0.2 (ref 0.0–1.0)
pH: 6.5 (ref 5.0–8.0)

## 2023-01-20 LAB — C-REACTIVE PROTEIN: CRP: 1.3 mg/dL (ref 0.5–20.0)

## 2023-01-20 NOTE — Patient Instructions (Signed)
It was a pleasure meeting you today. Thank you for allowing me to take part in your health care.  Our goals for today as we discussed include:  We will get some labs today.  If they are abnormal or we need to do something about them, I will call you.  If they are normal, I will send you a message on MyChart (if it is active) or a letter in the mail.  If you don't hear from [place] in 2 weeks, please call the office at the number below.   Follow up with PCP   If you have any questions or concerns, please do not hesitate to call the office at [phone].  I look forward to our next visit and until then take care and stay safe.  Regards,   Dana Allan, MD   [facility]

## 2023-01-20 NOTE — Progress Notes (Unsigned)
SUBJECTIVE:   Chief Complaint  Patient presents with   Fatigue    And light headed   HPI Patient presents to clinic for acute visit  Patient reports cannot take on left upper leg between 1 and 3 weeks ago.  Does not think tick was latched for more than 24 hours.  Was able to remove insect.  Denies any rash.  Endorses subjective fevers and chills the past week.  Has had joint pain in hips and shoulders and reports worse when he laying in bed.  Denies any cough, chest pain, shortness of breath, dizziness, nausea or vomiting.  No decrease in appetite.  Reports increased fatigue and has been unable to get out of bed for the past few days.  She fell out of bed few nights ago.  Foot got caught in the sheets as she was attempting to get out.  Hit her head but denies any loss of consciousness, slurred speech, headaches or loss of vision.  She endorses a change in color of her urine but denies any urinary symptoms of dysuria, back pain, frequency or feeling of not emptying bladder.  She is requesting blood work for Lyme disease as lives in an area that is highly populated with ticks.   PERTINENT PMH / PSH: Osteoporosis Cervical radiculopathy Left shoulder pain Left arm pain   OBJECTIVE:  BP 118/78   Pulse 87   Temp 98.5 F (36.9 C)   Ht 5\' 6"  (1.676 m)   Wt 127 lb (57.6 kg)   SpO2 99%   BMI 20.50 kg/m    Physical Exam Constitutional:      General: She is not in acute distress.    Appearance: She is normal weight. She is not ill-appearing.  HENT:     Head: Normocephalic.  Eyes:     Conjunctiva/sclera: Conjunctivae normal.  Neck:     Thyroid: No thyromegaly or thyroid tenderness.  Cardiovascular:     Rate and Rhythm: Normal rate and regular rhythm.     Pulses: Normal pulses.     Heart sounds: Normal heart sounds.  Pulmonary:     Effort: Pulmonary effort is normal.     Breath sounds: Normal breath sounds.  Abdominal:     General: Bowel sounds are normal.  Musculoskeletal:         General: Normal range of motion.     Cervical back: Normal range of motion.     Right lower leg: No edema.     Left lower leg: No edema.  Lymphadenopathy:     Cervical: No cervical adenopathy.  Skin:    General: Skin is warm.     Findings: No rash.     Comments: No rash present  Neurological:     General: No focal deficit present.     Mental Status: She is alert and oriented to person, place, and time. Mental status is at baseline.     Sensory: No sensory deficit.     Motor: No weakness.     Coordination: Coordination normal.     Gait: Gait normal.  Psychiatric:        Mood and Affect: Mood normal.        Behavior: Behavior normal.        Thought Content: Thought content normal.        Judgment: Judgment normal.        01/15/2023    2:04 PM 05/21/2022    3:51 PM 05/21/2022    3:22 PM 02/22/2022  9:09 AM 02/11/2022    9:13 AM  Depression screen PHQ 2/9  Decreased Interest 1 0 0 0 0  Down, Depressed, Hopeless 1 1 1 1  0  PHQ - 2 Score 2 1 1 1  0  Altered sleeping 1 1   0  Tired, decreased energy 1 1   0  Change in appetite 0 1   0  Feeling bad or failure about yourself  1 0   0  Trouble concentrating 1 0   0  Moving slowly or fidgety/restless 0 0   0  Suicidal thoughts 0 0   0  PHQ-9 Score 6 4   0  Difficult doing work/chores Somewhat difficult Somewhat difficult   Not difficult at all    ASSESSMENT/PLAN:  Tick bite of left thigh, initial encounter Assessment & Plan: Symptoms present x 1 week.  Weakness, subjective fever and chills, mild headache, joint pain.  Neuroexam benign today. Differentials include ROS MS, Lyme disease Viral etiology. Labs today Prophylactic treatment with doxycycline Start probiotics Offered CT head given recent head trauma.  Not currently on anticoagulation therapy. Patient declined.  Strict return precautions provided.   Orders: -     Urinalysis, Routine w reflex microscopic -     Lyme Disease Serology w/Reflex -      Comprehensive metabolic panel -     CBC with Differential/Platelet -     Sedimentation rate -     C-reactive protein -     Pathologist smear review; Future -     Doxycycline Hyclate; Take 1 tablet (100 mg total) by mouth 2 (two) times daily for 7 days.  Dispense: 14 tablet; Refill: 0 -     Saccharomyces boulardii; Take 1 capsule (250 mg total) by mouth daily.  Dispense: 90 capsule; Refill: 0  Lymphocytosis Assessment & Plan: New problem.  Lymphocytes increased 69.1. Pathology smear shows absolute lymphocytosis and it atypical lymphocytes. Recommend immunophenotyping by flow cytology if new/persistent finding.   Also noted microcytic hypochromic RBCs and recommended evaluation for iron deficiency. Given recent tick bite plan to treat and will repeat blood work in 3 weeks.  If remains elevated plan to send referral to hematology for evaluation.   Elevated liver enzymes Assessment & Plan: New finding on recent blood work.   Subjective fevers and chills. No acute abdomen.  No jaundice. Repeat LFTs at next visit If remains elevated plan for imaging Patient aware to avoid Tylenol, EtOH. Strict return precautions provided     PDMP reviewed  Return if symptoms worsen or fail to improve, for PCP.  Dana Allan, MD

## 2023-01-21 LAB — LYME DISEASE SEROLOGY W/REFLEX: Lyme Total Antibody EIA: NEGATIVE

## 2023-01-21 LAB — PATHOLOGIST SMEAR REVIEW

## 2023-01-24 ENCOUNTER — Encounter: Payer: Self-pay | Admitting: Family Medicine

## 2023-01-24 MED ORDER — DOXYCYCLINE HYCLATE 100 MG PO TABS
100.0000 mg | ORAL_TABLET | Freq: Two times a day (BID) | ORAL | 0 refills | Status: DC
Start: 2023-01-24 — End: 2023-01-29

## 2023-01-24 MED ORDER — SACCHAROMYCES BOULARDII 250 MG PO CAPS
250.0000 mg | ORAL_CAPSULE | Freq: Every day | ORAL | 0 refills | Status: DC
Start: 2023-01-24 — End: 2023-04-09

## 2023-01-26 ENCOUNTER — Encounter: Payer: Self-pay | Admitting: Family Medicine

## 2023-01-26 DIAGNOSIS — W57XXXA Bitten or stung by nonvenomous insect and other nonvenomous arthropods, initial encounter: Secondary | ICD-10-CM | POA: Insufficient documentation

## 2023-01-26 DIAGNOSIS — S70362A Insect bite (nonvenomous), left thigh, initial encounter: Secondary | ICD-10-CM | POA: Insufficient documentation

## 2023-01-26 DIAGNOSIS — R748 Abnormal levels of other serum enzymes: Secondary | ICD-10-CM | POA: Insufficient documentation

## 2023-01-26 DIAGNOSIS — D72820 Lymphocytosis (symptomatic): Secondary | ICD-10-CM | POA: Insufficient documentation

## 2023-01-26 NOTE — Assessment & Plan Note (Signed)
Symptoms present x 1 week.  Weakness, subjective fever and chills, mild headache, joint pain.  Neuroexam benign today. Differentials include ROS MS, Lyme disease Viral etiology. Labs today Prophylactic treatment with doxycycline Start probiotics Offered CT head given recent head trauma.  Not currently on anticoagulation therapy. Patient declined.  Strict return precautions provided.

## 2023-01-26 NOTE — Assessment & Plan Note (Signed)
New problem.  Lymphocytes increased 69.1. Pathology smear shows absolute lymphocytosis and it atypical lymphocytes. Recommend immunophenotyping by flow cytology if new/persistent finding.   Also noted microcytic hypochromic RBCs and recommended evaluation for iron deficiency. Given recent tick bite plan to treat and will repeat blood work in 3 weeks.  If remains elevated plan to send referral to hematology for evaluation.

## 2023-01-26 NOTE — Assessment & Plan Note (Signed)
New finding on recent blood work.   Subjective fevers and chills. No acute abdomen.  No jaundice. Repeat LFTs at next visit If remains elevated plan for imaging Patient aware to avoid Tylenol, EtOH. Strict return precautions provided

## 2023-01-29 ENCOUNTER — Encounter: Payer: Self-pay | Admitting: Family Medicine

## 2023-01-29 ENCOUNTER — Other Ambulatory Visit: Payer: Self-pay | Admitting: Family Medicine

## 2023-01-29 DIAGNOSIS — S70362A Insect bite (nonvenomous), left thigh, initial encounter: Secondary | ICD-10-CM

## 2023-01-29 MED ORDER — DOXYCYCLINE HYCLATE 100 MG PO TABS
100.0000 mg | ORAL_TABLET | Freq: Two times a day (BID) | ORAL | 0 refills | Status: AC
Start: 2023-01-29 — End: 2023-02-12

## 2023-02-10 ENCOUNTER — Ambulatory Visit: Payer: BC Managed Care – PPO | Admitting: Family Medicine

## 2023-02-19 ENCOUNTER — Encounter: Payer: Self-pay | Admitting: Adult Health

## 2023-02-19 ENCOUNTER — Ambulatory Visit (INDEPENDENT_AMBULATORY_CARE_PROVIDER_SITE_OTHER): Payer: Self-pay | Admitting: Adult Health

## 2023-02-19 ENCOUNTER — Other Ambulatory Visit: Payer: Self-pay

## 2023-02-19 VITALS — BP 102/68 | HR 94 | Temp 96.1°F

## 2023-02-19 DIAGNOSIS — R051 Acute cough: Secondary | ICD-10-CM

## 2023-02-19 NOTE — Progress Notes (Signed)
[facility] [address] [place], [place] [address]   Office Visit Note  Patient Name: Lauren Tanner Date of Birth [id number]  Medical Record number [id number]  Date of Service: 02/19/2023  Chief Complaint  Patient presents with   Cough    Cold symptoms started 2 weeks ago, lingering nonproductive cough.     Cough Pertinent negatives include no chest pain, chills, eye redness, fever or sore throat.   Pt is here for a sick visit. Patient reports she had a cough about 2 weeks ago.  She is doing well now, but has a lingering cough. She denies any fever, chills, fatigue or other symptoms.  She is going to visit her Mother-in-law who is 12 in [place] and wants to not take an illness to her.  She describes the cough as "random" and is often triggering   Current Medication:  Outpatient Encounter Medications as of 02/19/2023  Medication Sig Note   AIMOVIG 70 MG/ML SOAJ SMARTSIG:70 Milligram(s) SUB-Q Once a Month    buPROPion (WELLBUTRIN XL) 300 MG 24 hr tablet Take 300 mg by mouth daily.    clonazePAM (KLONOPIN) 0.5 MG tablet  11/24/2019: As needed   denosumab (PROLIA) 60 MG/ML SOLN injection Inject 60 mg into the skin once. Administer in upper arm, thigh, or abdomen    famotidine (PEPCID) 40 MG tablet TAKE 1 TABLET DAILY    gabapentin (NEURONTIN) 300 MG capsule Take 2 capsules (600 mg total) by mouth at bedtime.    imipramine (TOFRANIL) 50 MG tablet Take 50 mg by mouth 2 (two) times daily.    lamoTRIgine (LAMICTAL) 100 MG tablet Take 100 mg by mouth 3 (three) times daily.    naratriptan (AMERGE) 2.5 MG tablet Take 2.5 mg by mouth daily as needed.    saccharomyces boulardii (FLORASTOR) 250 MG capsule Take 1 capsule (250 mg total) by mouth daily.    No facility-administered encounter medications on file as of 02/19/2023.      Medical History: Past Medical History:  Diagnosis Date   Chronic interstitial cystitis 10/11/2011   Depression    no history of hospitalizations    Diverticulosis    Dysrhythmia    GERD (gastroesophageal reflux disease)    Headache disorder    managed with imipramine   History of colonic polyps 2011   last colonoscopy, [facility]   SVT (supraventricular tachycardia)    normal screening ER workup     Vital Signs: BP 102/68   Pulse 94   Temp (!) 96.1 F (35.6 C) (Tympanic)   SpO2 99%    Review of Systems  Constitutional:  Negative for chills and fever.  HENT:  Negative for sinus pressure, sneezing and sore throat.   Eyes:  Negative for pain, redness and itching.  Respiratory:  Positive for cough.   Cardiovascular:  Negative for chest pain.  Gastrointestinal:  Negative for diarrhea, nausea and vomiting.    Physical Exam Vitals and nursing note reviewed.  Constitutional:      Appearance: Normal appearance.  HENT:     Head: Normocephalic.     Nose: Nose normal.     Mouth/Throat:     Mouth: Mucous membranes are moist.  Cardiovascular:     Rate and Rhythm: Normal rate.  Pulmonary:     Effort: Pulmonary effort is normal.     Breath sounds: Normal breath sounds.  Neurological:     Mental Status: She is alert and oriented to person, place, and time.  Psychiatric:  Mood and Affect: Mood normal.        Behavior: Behavior normal.    Assessment/Plan: 1. Acute cough Lung sounds good.  Does not appear ill.  No coughing in exam room.   Discussed precautions if new or worse symptoms develop.  Follow up as needed.      General Counseling: Lauren Tanner verbalizes understanding of the findings of todays visit and agrees with plan of treatment. I have discussed any further diagnostic evaluation that may be needed or ordered today. We also reviewed her medications today. she has been encouraged to call the office with any questions or concerns that should arise related to todays visit.   No orders of the defined types were placed in this encounter.   No orders of the defined types were placed in this  encounter.   Time spent:20 Minutes    Johnna Acosta AGNP-C Nurse Practitioner

## 2023-03-03 NOTE — Progress Notes (Signed)
SUBJECTIVE:   Chief Complaint  Patient presents with   chest congestion    Was in head 2 weeks ago and moved chest no chest pain   HPI Presents to clinic for acute visit  Has had 2 weeks of chest congestion. Started with head congestion now moved to lungs.  Concerned for pneumonia.  Denies any fevers, fatigue, intermittent productive cough, night sweats, chest pain, nausea or vomiting. No recent weight loss.  Appetite remains unchanged.  Hydrating well.  Intermittent wheezing.  Reports history of fractured ribs and concerned that this may be interfering with deep breathing.  Was seen 07/24 by [facility] at [facility] for similar symptoms.  Had recently travelled out of state to visit Mother in law in [place].    Abnormal CBC Recent  relative lymphocytes elevated 69.1, ABS Lymphs 6.1 Hemoglobin 10.9, Normocytic Path smear showing absolute lymphocytosis and atypical lymphocytes.  Suggested immunophenotyping by flo cytometry.  Platelet clumping also noted.  Iron studies essential normal except for mild decrease in sat ratio. Discussed referral to hematology and patient agreeable.   Tick bite Symptoms resolved Feeling much better after taking antibiotics.   PERTINENT PMH / PSH: Anemia  OBJECTIVE:  BP 116/72   Pulse 89   Temp 97.9 F (36.6 C)   Resp 16   Ht 5\' 6"  (1.676 m)   Wt 131 lb 6 oz (59.6 kg)   SpO2 99%   BMI 21.20 kg/m    Physical Exam Vitals reviewed.  Constitutional:      General: She is not in acute distress.    Appearance: Normal appearance. She is normal weight. She is not ill-appearing, toxic-appearing or diaphoretic.  HENT:     Head: Normocephalic.     Nose: Nose normal.     Mouth/Throat:     Mouth: Mucous membranes are moist.  Eyes:     General:        Right eye: No discharge.        Left eye: No discharge.     Conjunctiva/sclera: Conjunctivae normal.  Neck:     Thyroid: No thyromegaly or thyroid tenderness.  Cardiovascular:     Rate and  Rhythm: Normal rate and regular rhythm.     Heart sounds: Normal heart sounds.  Pulmonary:     Effort: Pulmonary effort is normal.     Breath sounds: Normal breath sounds. No wheezing or rhonchi.  Chest:     Chest wall: No tenderness.  Lymphadenopathy:     Cervical: No cervical adenopathy.  Skin:    General: Skin is warm and dry.     Coloration: Skin is not pale.     Findings: No bruising.  Neurological:     General: No focal deficit present.     Mental Status: She is alert and oriented to person, place, and time. Mental status is at baseline.  Psychiatric:        Mood and Affect: Mood normal.        Behavior: Behavior normal.        Thought Content: Thought content normal.        Judgment: Judgment normal.        03/04/2023    1:50 PM 01/15/2023    2:04 PM 05/21/2022    3:51 PM 05/21/2022    3:22 PM 02/22/2022    9:09 AM  Depression screen PHQ 2/9  Decreased Interest 1 1 0 0 0  Down, Depressed, Hopeless 1 1 1 1 1   PHQ - 2 Score  2 2 1 1 1   Altered sleeping 1 1 1     Tired, decreased energy 2 1 1     Change in appetite 0 0 1    Feeling bad or failure about yourself  0 1 0    Trouble concentrating 0 1 0    Moving slowly or fidgety/restless 0 0 0    Suicidal thoughts 0 0 0    PHQ-9 Score 5 6 4     Difficult doing work/chores Somewhat difficult Somewhat difficult Somewhat difficult        03/04/2023    1:50 PM  GAD 7 : Generalized Anxiety Score  Nervous, Anxious, on Edge 0  Control/stop worrying 1  Worry too much - different things 1  Trouble relaxing 0  Restless 0  Easily annoyed or irritable 1  Afraid - awful might happen 1  Total GAD 7 Score 4  Anxiety Difficulty Somewhat difficult    ASSESSMENT/PLAN:  Anemia, unspecified type -     IBC + Ferritin  Lymphocytosis Assessment & Plan: New problem.  Lymphocytes increased 69.1. Pathology smear shows absolute lymphocytosis and it atypical lymphocytes. Recommend immunophenotyping by flow cytology if new/persistent  finding.   Also noted microcytic hypochromic RBCs and recommended evaluation for iron deficiency. Recently treated for Tick bite x 2 weeks Iron studies today Referral sent to Hematology, patient aware of referral and agreeable  Orders: -     Ambulatory referral to Hematology / Oncology  Gastroesophageal reflux disease without esophagitis -     Omeprazole; Take 1 capsule (20 mg total) by mouth daily.  Dispense: 30 capsule; Refill: 0  Chronic cough -     Fexofenadine HCl; Take 1 tablet (180 mg total) by mouth daily.  Dispense: 30 tablet; Refill: 2 -     DG Chest 2 View; Future  Tick bite of left thigh, sequela Assessment & Plan: Resolved     Chest congestion Assessment & Plan: Ongoing for 1 month Suspect post viral.  Afebrile and hemodynamically stable. Recently completed 2 weeks of Doxycycline for tick bite so less likely CAP Will obtain chest xray Considered GERD, patient currently taking Pepcid.  Will add omeprazole 20 mg daily to trial for 2-4 weeks Will trial Allegra 180 mg daily  Increase fluid intake Follow up with PCP if no improvement    PDMP reviewed  Return if symptoms worsen or fail to improve, for PCP.  Dana Allan, MD

## 2023-03-03 NOTE — Patient Instructions (Signed)
It was a pleasure meeting you today. Thank you for allowing me to take part in your health care.  Our goals for today as we discussed include:  Will get chest xray today  Start Allegra daily Start Omeprazole 20 mg daily.  If no improvement in 1-2 weeks can stop  Continue Pepcid 40 mg daily  Will check iron today Refer to hematology for high lymphocytes  If you have any questions or concerns, please do not hesitate to call the office at [phone].  I look forward to our next visit and until then take care and stay safe.  Regards,   Dana Allan, MD   [facility]

## 2023-03-04 ENCOUNTER — Ambulatory Visit (INDEPENDENT_AMBULATORY_CARE_PROVIDER_SITE_OTHER): Payer: BC Managed Care – PPO

## 2023-03-04 ENCOUNTER — Encounter: Payer: Self-pay | Admitting: Family Medicine

## 2023-03-04 ENCOUNTER — Ambulatory Visit: Payer: BC Managed Care – PPO | Admitting: Family Medicine

## 2023-03-04 VITALS — BP 116/72 | HR 89 | Temp 97.9°F | Resp 16 | Ht 66.0 in | Wt 131.4 lb

## 2023-03-04 DIAGNOSIS — R053 Chronic cough: Secondary | ICD-10-CM

## 2023-03-04 DIAGNOSIS — D649 Anemia, unspecified: Secondary | ICD-10-CM | POA: Diagnosis not present

## 2023-03-04 DIAGNOSIS — K219 Gastro-esophageal reflux disease without esophagitis: Secondary | ICD-10-CM | POA: Diagnosis not present

## 2023-03-04 DIAGNOSIS — D72820 Lymphocytosis (symptomatic): Secondary | ICD-10-CM

## 2023-03-04 DIAGNOSIS — S70362S Insect bite (nonvenomous), left thigh, sequela: Secondary | ICD-10-CM

## 2023-03-04 DIAGNOSIS — R0989 Other specified symptoms and signs involving the circulatory and respiratory systems: Secondary | ICD-10-CM

## 2023-03-04 DIAGNOSIS — W57XXXS Bitten or stung by nonvenomous insect and other nonvenomous arthropods, sequela: Secondary | ICD-10-CM

## 2023-03-04 MED ORDER — FEXOFENADINE HCL 180 MG PO TABS: 180.0000 mg | ORAL_TABLET | Freq: Every day | ORAL | 2 refills | Status: DC

## 2023-03-04 MED ORDER — OMEPRAZOLE 20 MG PO CPDR
20.0000 mg | DELAYED_RELEASE_CAPSULE | Freq: Every day | ORAL | 0 refills | Status: DC
Start: 2023-03-04 — End: 2023-04-09

## 2023-03-06 ENCOUNTER — Other Ambulatory Visit: Payer: Self-pay | Admitting: Internal Medicine

## 2023-03-06 DIAGNOSIS — Z1231 Encounter for screening mammogram for malignant neoplasm of breast: Secondary | ICD-10-CM

## 2023-03-07 DIAGNOSIS — F411 Generalized anxiety disorder: Secondary | ICD-10-CM | POA: Diagnosis not present

## 2023-03-07 DIAGNOSIS — F331 Major depressive disorder, recurrent, moderate: Secondary | ICD-10-CM | POA: Diagnosis not present

## 2023-03-10 DIAGNOSIS — M542 Cervicalgia: Secondary | ICD-10-CM | POA: Diagnosis not present

## 2023-03-10 DIAGNOSIS — G43719 Chronic migraine without aura, intractable, without status migrainosus: Secondary | ICD-10-CM | POA: Diagnosis not present

## 2023-03-13 ENCOUNTER — Encounter (INDEPENDENT_AMBULATORY_CARE_PROVIDER_SITE_OTHER): Payer: Self-pay

## 2023-03-14 ENCOUNTER — Ambulatory Visit
Admission: RE | Admit: 2023-03-14 | Discharge: 2023-03-14 | Disposition: A | Discharge status: Home / Self Care | Payer: BC Managed Care – PPO | Source: Ambulatory Visit | Attending: Internal Medicine | Admitting: Internal Medicine

## 2023-03-14 DIAGNOSIS — Z1231 Encounter for screening mammogram for malignant neoplasm of breast: Secondary | ICD-10-CM | POA: Insufficient documentation

## 2023-03-16 ENCOUNTER — Encounter: Payer: Self-pay | Admitting: Family Medicine

## 2023-03-16 DIAGNOSIS — R0989 Other specified symptoms and signs involving the circulatory and respiratory systems: Secondary | ICD-10-CM | POA: Insufficient documentation

## 2023-03-16 NOTE — Assessment & Plan Note (Addendum)
Ongoing for 1 month Suspect post viral.  Afebrile and hemodynamically stable. Recently completed 2 weeks of Doxycycline for tick bite so less likely CAP Will obtain chest xray Considered GERD, patient currently taking Pepcid.  Will add omeprazole 20 mg daily to trial for 2-4 weeks Will trial Allegra 180 mg daily  Increase fluid intake Follow up with PCP if no improvement

## 2023-03-16 NOTE — Assessment & Plan Note (Signed)
Resolved

## 2023-03-16 NOTE — Assessment & Plan Note (Signed)
New problem.  Lymphocytes increased 69.1. Pathology smear shows absolute lymphocytosis and it atypical lymphocytes. Recommend immunophenotyping by flow cytology if new/persistent finding.   Also noted microcytic hypochromic RBCs and recommended evaluation for iron deficiency. Recently treated for Tick bite x 2 weeks Iron studies today Referral sent to Hematology, patient aware of referral and agreeable

## 2023-04-07 ENCOUNTER — Telehealth: Payer: Self-pay | Admitting: *Deleted

## 2023-04-07 NOTE — Telephone Encounter (Signed)
Pt scheduled for Prolia on 04/29/2023  $0 due, PA required  Sent to PA team for approval (last auth ended on 03/06/23)

## 2023-04-09 ENCOUNTER — Other Ambulatory Visit: Payer: Self-pay | Admitting: Family Medicine

## 2023-04-09 ENCOUNTER — Ambulatory Visit (INDEPENDENT_AMBULATORY_CARE_PROVIDER_SITE_OTHER): Payer: BC Managed Care – PPO

## 2023-04-09 ENCOUNTER — Encounter: Payer: Self-pay | Admitting: Internal Medicine

## 2023-04-09 ENCOUNTER — Ambulatory Visit: Payer: BC Managed Care – PPO | Admitting: Internal Medicine

## 2023-04-09 VITALS — BP 110/68 | HR 90 | Temp 98.9°F | Ht 66.0 in | Wt 130.8 lb

## 2023-04-09 DIAGNOSIS — S70362S Insect bite (nonvenomous), left thigh, sequela: Secondary | ICD-10-CM | POA: Diagnosis not present

## 2023-04-09 DIAGNOSIS — D72820 Lymphocytosis (symptomatic): Secondary | ICD-10-CM

## 2023-04-09 DIAGNOSIS — R9389 Abnormal findings on diagnostic imaging of other specified body structures: Secondary | ICD-10-CM

## 2023-04-09 DIAGNOSIS — R0602 Shortness of breath: Secondary | ICD-10-CM | POA: Diagnosis not present

## 2023-04-09 DIAGNOSIS — D649 Anemia, unspecified: Secondary | ICD-10-CM | POA: Diagnosis not present

## 2023-04-09 DIAGNOSIS — S70362A Insect bite (nonvenomous), left thigh, initial encounter: Secondary | ICD-10-CM | POA: Diagnosis not present

## 2023-04-09 DIAGNOSIS — K219 Gastro-esophageal reflux disease without esophagitis: Secondary | ICD-10-CM

## 2023-04-09 DIAGNOSIS — R7401 Elevation of levels of liver transaminase levels: Secondary | ICD-10-CM

## 2023-04-09 DIAGNOSIS — W57XXXS Bitten or stung by nonvenomous insect and other nonvenomous arthropods, sequela: Secondary | ICD-10-CM

## 2023-04-09 DIAGNOSIS — R918 Other nonspecific abnormal finding of lung field: Secondary | ICD-10-CM | POA: Diagnosis not present

## 2023-04-09 LAB — COMPREHENSIVE METABOLIC PANEL
ALT: 21 U/L (ref 0–35)
AST: 21 U/L (ref 0–37)
Albumin: 3.9 g/dL (ref 3.5–5.2)
Alkaline Phosphatase: 67 U/L (ref 39–117)
BUN: 25 mg/dL — ABNORMAL HIGH (ref 6–23)
CO2: 29 meq/L (ref 19–32)
Calcium: 9.3 mg/dL (ref 8.4–10.5)
Chloride: 105 meq/L (ref 96–112)
Creatinine, Ser: 0.93 mg/dL (ref 0.40–1.20)
GFR: 64.13 mL/min (ref >60.00)
Glucose, Bld: 75 mg/dL (ref 70–99)
Potassium: 4.5 meq/L (ref 3.5–5.1)
Sodium: 140 meq/L (ref 135–145)
Total Bilirubin: 0.4 mg/dL (ref 0.2–1.2)
Total Protein: 6.9 g/dL (ref 6.0–8.3)

## 2023-04-09 LAB — CBC WITH DIFFERENTIAL/PLATELET
Basophils Absolute: 0 10*3/uL (ref 0.0–0.1)
Basophils Relative: 0.6 % (ref 0.0–3.0)
Eosinophils Absolute: 0 10*3/uL (ref 0.0–0.7)
Eosinophils Relative: 0 % (ref 0.0–5.0)
HCT: 32.4 % — ABNORMAL LOW (ref 36.0–46.0)
Hemoglobin: 10.9 g/dL — ABNORMAL LOW (ref 12.0–15.0)
Lymphocytes Relative: 39.8 % (ref 12.0–46.0)
Lymphs Abs: 2.5 10*3/uL (ref 0.7–4.0)
MCHC: 33.6 g/dL (ref 30.0–36.0)
MCV: 95.4 fl (ref 78.0–100.0)
Monocytes Absolute: 0.4 10*3/uL (ref 0.1–1.0)
Monocytes Relative: 6 % (ref 3.0–12.0)
Neutro Abs: 3.3 10*3/uL (ref 1.4–7.7)
Neutrophils Relative %: 53.6 % (ref 43.0–77.0)
Platelets: 333 10*3/uL (ref 150.0–400.0)
RBC: 3.4 Mil/uL — ABNORMAL LOW (ref 3.87–5.11)
RDW: 13.5 % (ref 11.5–15.5)
WBC: 6.2 10*3/uL (ref 4.0–10.5)

## 2023-04-09 LAB — B12 AND FOLATE PANEL
Folate: >24.2 ng/mL (ref >5.9)
Vitamin B-12: 1447 pg/mL — ABNORMAL HIGH (ref 211–911)

## 2023-04-09 MED ORDER — DOXYCYCLINE HYCLATE 100 MG PO TABS: 100.0000 mg | ORAL_TABLET | Freq: Two times a day (BID) | ORAL | 0 refills | Status: DC

## 2023-04-09 NOTE — Telephone Encounter (Signed)
Pharmacy Patient Advocate Encounter   Received notification from Pt Calls Messages that prior authorization for PROLIA is required/requested.   Insurance verification completed.   The patient is insured through [facility] .   Per test claim: PA required; PA submitted to [facility] via Fax Key/confirmation #[id number] --- Status is pending

## 2023-04-09 NOTE — Progress Notes (Unsigned)
Subjective:  Patient ID: Abby Potash, female    DOB: 1955-08-23  Age: 67 y.o. MRN: [id number]  CC: {There were no encounter diagnoses. (Refresh or delete this SmartLink)}   HPI Alexanderia Obas presents for  Chief Complaint  Patient presents with   Medical Management of Chronic Issues    2 month follow up    Evaluated by Dr Clent Ridges  twice in June and again in  August 6 for  persistent symptoms following a tick bite in June that was not treated with doxycycline for 2 weeks despite having fevers,  body aches, and extreme fatigue no test for Ehrlicihia or RMSF was done. Lyme serolies only   were negative .    Labs and CSR were abnormal:  CBC noted lymphocytosis,,   cxr noted hyperexpanded ;ugs,  bilateral scarring, and interstiatil thickening.  LFTs s were moderately elevated .  No repeat labs have been done she was referred to heme onc but did not return their call x 3 so the referral was closed.  Had been treated for tick bite prior to visit.     Outpatient Medications Prior to Visit  Medication Sig Dispense Refill   AIMOVIG 70 MG/ML SOAJ SMARTSIG:70 Milligram(s) SUB-Q Once a Month     buPROPion (WELLBUTRIN XL) 300 MG 24 hr tablet Take 300 mg by mouth daily.     clonazePAM (KLONOPIN) 0.5 MG tablet   0   denosumab (PROLIA) 60 MG/ML SOLN injection Inject 60 mg into the skin once. Administer in upper arm, thigh, or abdomen 1 Syringe 1   famotidine (PEPCID) 40 MG tablet TAKE 1 TABLET DAILY 90 tablet 1   fexofenadine (ALLEGRA ALLERGY) 180 MG tablet Take 1 tablet (180 mg total) by mouth daily. 30 tablet 2   gabapentin (NEURONTIN) 300 MG capsule Take 2 capsules (600 mg total) by mouth at bedtime. 60 capsule 1   imipramine (TOFRANIL) 50 MG tablet Take 50 mg by mouth 2 (two) times daily.     lamoTRIgine (LAMICTAL) 100 MG tablet Take 100 mg by mouth 3 (three) times daily.     naratriptan (AMERGE) 2.5 MG tablet Take 2.5 mg by mouth daily as needed.     omeprazole (PRILOSEC) 20 MG capsule Take 1 capsule  (20 mg total) by mouth daily. 30 capsule 0   saccharomyces boulardii (FLORASTOR) 250 MG capsule Take 1 capsule (250 mg total) by mouth daily. 90 capsule 0   No facility-administered medications prior to visit.    Review of Systems;  Patient denies headache, fevers, malaise, unintentional weight loss, skin rash, eye pain, sinus congestion and sinus pain, sore throat, dysphagia,  hemoptysis , cough, dyspnea, wheezing, chest pain, palpitations, orthopnea, edema, abdominal pain, nausea, melena, diarrhea, constipation, flank pain, dysuria, hematuria, urinary  Frequency, nocturia, numbness, tingling, seizures,  Focal weakness, Loss of consciousness,  Tremor, insomnia, depression, anxiety, and suicidal ideation.      Objective:  BP 110/68   Pulse 90   Temp 98.9 F (37.2 C) (Oral)   Ht 5\' 6"  (1.676 m)   Wt 130 lb 12.8 oz (59.3 kg)   SpO2 99%   BMI 21.11 kg/m   BP Readings from Last 3 Encounters:  04/09/23 110/68  03/04/23 116/72  02/19/23 102/68    Wt Readings from Last 3 Encounters:  04/09/23 130 lb 12.8 oz (59.3 kg)  03/04/23 131 lb 6 oz (59.6 kg)  01/20/23 127 lb (57.6 kg)    Physical Exam  No results found for: "HGBA1C"  Lab Results  Component Value Date   CREATININE 0.87 01/20/2023   CREATININE 1.00 06/06/2022   CREATININE 1.09 02/11/2022    Lab Results  Component Value Date   WBC 8.8 01/20/2023   HGB 10.9 (L) 01/20/2023   HCT 32.2 (L) 01/20/2023   PLT 222.0 01/20/2023   GLUCOSE 95 01/20/2023   CHOL 217 (H) 02/11/2022   TRIG 161.0 (H) 02/11/2022   HDL 48.60 02/11/2022   LDLDIRECT 135.9 11/17/2012   LDLCALC 136 (H) 02/11/2022   ALT 168 (H) 01/20/2023   AST 108 (H) 01/20/2023   NA 138 01/20/2023   K 4.2 01/20/2023   CL 103 01/20/2023   CREATININE 0.87 01/20/2023   BUN 19 01/20/2023   CO2 29 01/20/2023   TSH 2.89 02/11/2022   INR 1.0 03/14/2020   MICROALBUR 1.1 02/22/2022    MM 3D SCREENING MAMMOGRAM BILATERAL BREAST  Result Date: 03/18/2023 CLINICAL  DATA:  Screening. EXAM: DIGITAL SCREENING BILATERAL MAMMOGRAM WITH TOMOSYNTHESIS AND CAD TECHNIQUE: Bilateral screening digital craniocaudal and mediolateral oblique mammograms were obtained. Bilateral screening digital breast tomosynthesis was performed. The images were evaluated with computer-aided detection. COMPARISON:  Previous exam(s). ACR Breast Density Category d: The breasts are extremely dense, which lowers the sensitivity of mammography. FINDINGS: There are no findings suspicious for malignancy. IMPRESSION: No mammographic evidence of malignancy. A result letter of this screening mammogram will be mailed directly to the patient. RECOMMENDATION: Screening mammogram in one year. (Code:SM-B-01Y) BI-RADS CATEGORY  1: Negative. Electronically Signed   By: Frederico Hamman M.D.   On: 03/18/2023 12:23    Assessment & Plan:  .There are no diagnoses linked to this encounter.   I provided 30 minutes of face-to-face time during this encounter reviewing patient's last visit with me, patient's  most recent visit with cardiology,  nephrology,  and neurology,  recent surgical and non surgical procedures, previous  labs and imaging studies, counseling on currently addressed issues,  and post visit ordering to diagnostics and therapeutics .   Follow-up: No follow-ups on file.   Sherlene Shams, MD

## 2023-04-09 NOTE — Patient Instructions (Signed)
I am very sorry about your miserable summer  I have place a  prescription for Doxycycline on file for you at your pharmacy.  FILL IT BUT DO NOT START UNLESS YOU HAVE ANOTHER SYMPTOMATIC TICK BITE.   REPEAT LABS AND CHEST FILM TODAY.  HEMATOLOGY REFERRAL IF ABNORMALITIES PERSIST

## 2023-04-10 NOTE — Assessment & Plan Note (Addendum)
Hematology reerral deferred by patient. Repeat CBC notes mature neurtophial , rare atypical lymphocytes,  and hypochromic RBCs suggesting iron deficiency Lab Results  Component Value Date   WBC 6.2 04/09/2023   HGB 10.9 (L) 04/09/2023   HCT 32.4 (L) 04/09/2023   MCV 95.4 04/09/2023   PLT 333.0 04/09/2023   Lab Results  Component Value Date   IRON 58 04/09/2023   TIBC 255 04/09/2023   FERRITIN 94 04/09/2023

## 2023-04-10 NOTE — Assessment & Plan Note (Addendum)
Etiology unclear  .  B12,  folate and iron studies are normal. ANA pending . Referring to hematology

## 2023-04-10 NOTE — Assessment & Plan Note (Addendum)
Empiric treatment with doxycycline , Lyme serologies negative,  checking ehrlichia and RMSF

## 2023-04-15 LAB — ROCKY MTN SPOTTED FVR ABS PNL(IGG+IGM)
RMSF IgG: NOT DETECTED
RMSF IgM: NOT DETECTED

## 2023-04-15 LAB — ERYTHROPOIETIN: Erythropoietin: 15 m[IU]/mL (ref 2.6–18.5)

## 2023-04-15 LAB — ANTI-NUCLEAR AB-TITER (ANA TITER): ANA Titer 1: 1:80 {titer} — ABNORMAL HIGH

## 2023-04-15 LAB — IRON,TIBC AND FERRITIN PANEL
%SAT: 23 % (ref 16–45)
Ferritin: 94 ng/mL (ref 16–288)
Iron: 58 ug/dL (ref 45–160)
TIBC: 255 ug/dL (ref 250–450)

## 2023-04-15 LAB — PATHOLOGIST SMEAR REVIEW

## 2023-04-15 LAB — ANA: Anti Nuclear Antibody (ANA): POSITIVE — AB

## 2023-04-16 ENCOUNTER — Inpatient Hospital Stay: Payer: BC Managed Care – PPO

## 2023-04-16 ENCOUNTER — Encounter: Payer: Self-pay | Admitting: Oncology

## 2023-04-16 ENCOUNTER — Inpatient Hospital Stay: Payer: BC Managed Care – PPO | Attending: Oncology | Admitting: Oncology

## 2023-04-16 VITALS — BP 126/84 | HR 81 | Temp 98.0°F | Resp 16 | Ht 66.0 in | Wt 128.0 lb

## 2023-04-16 DIAGNOSIS — Z79899 Other long term (current) drug therapy: Secondary | ICD-10-CM | POA: Insufficient documentation

## 2023-04-16 DIAGNOSIS — D649 Anemia, unspecified: Secondary | ICD-10-CM | POA: Diagnosis not present

## 2023-04-16 LAB — CBC (CANCER CENTER ONLY)
HCT: 32.5 % — ABNORMAL LOW (ref 36.0–46.0)
Hemoglobin: 10.9 g/dL — ABNORMAL LOW (ref 12.0–15.0)
MCH: 32.1 pg (ref 26.0–34.0)
MCHC: 33.5 g/dL (ref 30.0–36.0)
MCV: 95.6 fL (ref 80.0–100.0)
Platelet Count: 302 10*3/uL (ref 150–400)
RBC: 3.4 MIL/uL — ABNORMAL LOW (ref 3.87–5.11)
RDW: 12.8 % (ref 11.5–15.5)
WBC Count: 6.3 10*3/uL (ref 4.0–10.5)
nRBC: 0 % (ref 0.0–0.2)

## 2023-04-16 LAB — LACTATE DEHYDROGENASE: LDH: 146 U/L (ref 98–192)

## 2023-04-16 NOTE — Progress Notes (Signed)
[facility]  [phone] [phone]  ID: Lauren Tanner OB: 12-11-1955  MR#: [id number]  NAT#:[id number]  Patient Care Team: Sherlene Shams, MD as PCP - General (Internal Medicine)  CHIEF COMPLAINT: Anemia, unspecified.  INTERVAL HISTORY: Patient is a 67 year old female who was noted to have a slowly declining hemoglobin since approximately 2021.  She feels more tired recently, but otherwise feels well and is asymptomatic.  She has no neurologic complaints.  She denies any recent fevers or illnesses.  She has a good appetite and denies weight loss.  She has no chest pain, shortness of breath, cough, or hemoptysis.  She denies any nausea, vomiting, constipation, or diarrhea.  She has no melena or hematochezia.  She has no urinary complaints.  Patient feels at her baseline offers no further specific complaints today.  REVIEW OF SYSTEMS:   Review of Systems  Constitutional: Negative.  Negative for fever, malaise/fatigue and weight loss.  Respiratory: Negative.  Negative for cough, hemoptysis and shortness of breath.   Cardiovascular: Negative.  Negative for chest pain and leg swelling.  Gastrointestinal: Negative.  Negative for abdominal pain, blood in stool and melena.  Genitourinary: Negative.  Negative for dysuria.  Musculoskeletal: Negative.  Negative for back pain.  Skin: Negative.  Negative for rash.  Neurological: Negative.  Negative for dizziness, focal weakness, weakness and headaches.  Psychiatric/Behavioral: Negative.  The patient is not nervous/anxious.     As per HPI. Otherwise, a complete review of systems is negative.  PAST MEDICAL HISTORY: Past Medical History:  Diagnosis Date   Anemia    Anxiety    Arthritis    Chronic interstitial cystitis 10/11/2011   Depression    no history of hospitalizations   Diverticulosis    Dysrhythmia    GERD (gastroesophageal reflux disease)    Headache disorder    managed with imipramine    History of colonic polyps 2011   last colonoscopy, [facility]   SVT (supraventricular tachycardia)    normal screening ER workup    PAST SURGICAL HISTORY: Past Surgical History:  Procedure Laterality Date   COLONOSCOPY WITH PROPOFOL N/A 05/19/2015   Procedure: COLONOSCOPY WITH PROPOFOL;  Surgeon: Scot Jun, MD;  Location[facility] END[facility]COPY;  Service: Endoscopy;  Laterality: N/A;   COLONOSCOPY WITH PROPOFOL N/A 12/20/2019   Procedure: COLONOSCOPY WITH PROPOFOL;  Surgeon: Pasty Spillers, MD;  Location: [facility] ENDOSCOPY;  Service: Endoscopy;  Laterality: N/A;   COLONOSCOPY WITH PROPOFOL N/A 07/11/2020   Procedure: COLONOSCOPY WITH PROPOFOL;  Surgeon: Wyline Mood, MD;  Location: [facility] ENDOSCOPY;  Service: Endoscopy;  Laterality: N/A;   DILATION AND CURETTAGE OF UTERUS     FRACTURE SURGERY      FAMILY HISTORY: Family History  Problem Relation Age of Onset   Mental illness Mother    Cancer Father 61       colon Ca,  bladder ca   Breast cancer Neg Hx     ADVANCED DIRECTIVES (Y/N):  N  HEALTH MAINTENANCE: Social History   Tobacco Use   Smoking status: Never   Smokeless tobacco: Never  Vaping Use   Vaping status: Never Used  Substance Use Topics   Alcohol use: Not Currently   Drug use: Never     Colonoscopy:  PAP:  Bone density:  Lipid panel:  Allergies  Allergen Reactions   Ampicillin Hives   Septra [Bactrim] Hives   Sulfamethoxazole-Trimethoprim Hives   Sulfa Antibiotics Hives    Current Outpatient Medications  Medication Sig Dispense Refill  AIMOVIG 70 MG/ML SOAJ SMARTSIG:70 Milligram(s) SUB-Q Once a Month     buPROPion (WELLBUTRIN XL) 300 MG 24 hr tablet Take 300 mg by mouth daily.     clonazePAM (KLONOPIN) 0.5 MG tablet   0   denosumab (PROLIA) 60 MG/ML SOLN injection Inject 60 mg into the skin once. Administer in upper arm, thigh, or abdomen 1 Syringe 1   doxycycline (VIBRA-TABS) 100 MG tablet Take 1 tablet (100 mg total) by mouth 2 (two) times  daily. 20 tablet 0   famotidine (PEPCID) 40 MG tablet TAKE 1 TABLET DAILY 90 tablet 1   imipramine (TOFRANIL) 50 MG tablet Take 50 mg by mouth 2 (two) times daily.     lamoTRIgine (LAMICTAL) 100 MG tablet Take 100 mg by mouth 3 (three) times daily.     naratriptan (AMERGE) 2.5 MG tablet Take 2.5 mg by mouth daily as needed.     omeprazole (PRILOSEC) 20 MG capsule TAKE 1 CAPSULE(20 MG) BY MOUTH DAILY 30 capsule 0   gabapentin (NEURONTIN) 300 MG capsule Take 2 capsules (600 mg total) by mouth at bedtime. (Patient not taking: Reported on 04/16/2023) 60 capsule 1   No current facility-administered medications for this visit.    OBJECTIVE: Vitals:   04/16/23 1117  BP: 126/84  Pulse: 81  Resp: 16  Temp: 98 F (36.7 C)  SpO2: 100%     Body mass index is 20.66 kg/m.    ECOG FS:0 - Asymptomatic  General: Well-developed, well-nourished, no acute distress. Eyes: Pink conjunctiva, anicteric sclera. HEENT: Normocephalic, moist mucous membranes. Lungs: No audible wheezing or coughing. Heart: Regular rate and rhythm. Abdomen: Soft, nontender, no obvious distention. Musculoskeletal: No edema, cyanosis, or clubbing. Neuro: Alert, answering all questions appropriately. Cranial nerves grossly intact. Skin: No rashes or petechiae noted. Psych: Normal affect. Lymphatics: No cervical, calvicular, axillary or inguinal LAD.   LAB RESULTS:  Lab Results  Component Value Date   NA 140 04/09/2023   K 4.5 04/09/2023   CL 105 04/09/2023   CO2 29 04/09/2023   GLUCOSE 75 04/09/2023   BUN 25 (H) 04/09/2023   CREATININE 0.93 04/09/2023   CALCIUM 9.3 04/09/2023   PROT 6.9 04/09/2023   ALBUMIN 3.9 04/09/2023   AST 21 04/09/2023   ALT 21 04/09/2023   ALKPHOS 67 04/09/2023   BILITOT 0.4 04/09/2023   GFRNONAA 55 (L) 03/14/2020   GFRAA >60 03/14/2020    Lab Results  Component Value Date   WBC 6.3 04/16/2023   NEUTROABS 3.3 04/09/2023   HGB 10.9 (L) 04/16/2023   HCT 32.5 (L) 04/16/2023   MCV  95.6 04/16/2023   PLT 302 04/16/2023   Lab Results  Component Value Date   IRON 58 04/09/2023   TIBC 255 04/09/2023   IRONPCTSAT 23 04/09/2023   Lab Results  Component Value Date   FERRITIN 94 04/09/2023    STUDIES: No results found.  ASSESSMENT: Anemia, unspecified.  PLAN:    Anemia, unspecified: Patient's hemoglobin has noted to trend down slowly over the past several years and her most recent result is stable at 10.9.  Previously, iron stores, B12, and folate were within normal limits.  She does not appear to have any hemolysis.  The remainder of her laboratory work from today is pending at time of dictation.  No intervention is needed at this time.  We discussed the possibility of a bone marrow biopsy, but this is not necessary at this point.  Return to clinic in 3 months with repeat laboratory  work and further evaluation.  I spent a total of 45 minutes reviewing chart data, face-to-face evaluation with the patient, counseling and coordination of care as detailed above.   Patient expressed understanding and was in agreement with this plan. She also understands that She can call clinic at any time with any questions, concerns, or complaints.    Jeralyn Ruths, MD   04/16/2023 3:50 PM

## 2023-04-17 ENCOUNTER — Encounter: Payer: Self-pay | Admitting: Internal Medicine

## 2023-04-17 ENCOUNTER — Telehealth: Payer: Self-pay

## 2023-04-17 LAB — HAPTOGLOBIN: Haptoglobin: 208 mg/dL (ref 37–355)

## 2023-04-17 NOTE — Telephone Encounter (Signed)
noted 

## 2023-04-17 NOTE — Telephone Encounter (Signed)
-----   Message from Sherlene Shams sent at 04/16/2023  5:33 PM EDT ----- THE REST OF YOUR LABS HAVE RESULTED.  YOUR SUMMER ILLNESS WAS DUE TO EHRLICHIA,  which is  a tick borne illness that is treated with doxycycline,  so you have been appropriately treated by Dr Clent Ridges.   Your anemia is not due to iron deficiency. It may be due to an autoimmune syndrome because your ANA was positive.  Please keep the appointment with the hematologist

## 2023-04-17 NOTE — Telephone Encounter (Signed)
Pt called back and I read the note to her and she verbalized understanding

## 2023-04-17 NOTE — Telephone Encounter (Signed)
LMTCB in regards to lab results.  

## 2023-04-17 NOTE — Telephone Encounter (Signed)
Prolia approved. Effective dates: 04/09/23-04/08/24 Reference#: [id number]

## 2023-04-20 LAB — PROTEIN ELECTROPHORESIS, SERUM
A/G Ratio: 1.2 (ref 0.7–1.7)
Albumin ELP: 3.6 g/dL (ref 2.9–4.4)
Alpha-1-Globulin: 0.3 g/dL (ref 0.0–0.4)
Alpha-2-Globulin: 0.8 g/dL (ref 0.4–1.0)
Beta Globulin: 0.9 g/dL (ref 0.7–1.3)
Gamma Globulin: 1 g/dL (ref 0.4–1.8)
Globulin, Total: 3 g/dL (ref 2.2–3.9)
Total Protein ELP: 6.6 g/dL (ref 6.0–8.5)

## 2023-04-23 NOTE — Telephone Encounter (Signed)
Noted, thanks!

## 2023-04-25 DIAGNOSIS — R0989 Other specified symptoms and signs involving the circulatory and respiratory systems: Secondary | ICD-10-CM

## 2023-04-29 ENCOUNTER — Ambulatory Visit: Payer: BC Managed Care – PPO

## 2023-04-29 DIAGNOSIS — M810 Age-related osteoporosis without current pathological fracture: Secondary | ICD-10-CM | POA: Diagnosis not present

## 2023-04-29 MED ORDER — DENOSUMAB 60 MG/ML ~~LOC~~ SOSY
60.0000 mg | PREFILLED_SYRINGE | Freq: Once | SUBCUTANEOUS | Status: AC
Start: 2023-04-29 — End: 2023-04-29
  Administered 2023-04-29: 60 mg via SUBCUTANEOUS

## 2023-04-29 NOTE — Progress Notes (Signed)
Patient presented for Prolia injection to right arm, patient voiced no concerns nor showed any signs of distress during injection

## 2023-05-02 LAB — INTELLIGEN MYELOID

## 2023-05-16 DIAGNOSIS — G43719 Chronic migraine without aura, intractable, without status migrainosus: Secondary | ICD-10-CM | POA: Diagnosis not present

## 2023-05-16 DIAGNOSIS — M542 Cervicalgia: Secondary | ICD-10-CM | POA: Diagnosis not present

## 2023-05-23 ENCOUNTER — Encounter: Payer: BC Managed Care – PPO | Admitting: Internal Medicine

## 2023-06-11 DIAGNOSIS — F331 Major depressive disorder, recurrent, moderate: Secondary | ICD-10-CM | POA: Diagnosis not present

## 2023-06-11 DIAGNOSIS — F411 Generalized anxiety disorder: Secondary | ICD-10-CM | POA: Diagnosis not present

## 2023-06-17 ENCOUNTER — Other Ambulatory Visit: Payer: Self-pay

## 2023-06-17 DIAGNOSIS — K219 Gastro-esophageal reflux disease without esophagitis: Secondary | ICD-10-CM

## 2023-06-17 MED ORDER — OMEPRAZOLE 20 MG PO CPDR
DELAYED_RELEASE_CAPSULE | ORAL | 2 refills | Status: DC
Start: 2023-06-17 — End: 2023-08-05

## 2023-06-24 ENCOUNTER — Ambulatory Visit (INDEPENDENT_AMBULATORY_CARE_PROVIDER_SITE_OTHER): Payer: BC Managed Care – PPO | Admitting: Internal Medicine

## 2023-06-24 VITALS — BP 98/62 | HR 92 | Ht 66.0 in | Wt 128.4 lb

## 2023-06-24 DIAGNOSIS — M25541 Pain in joints of right hand: Secondary | ICD-10-CM | POA: Diagnosis not present

## 2023-06-24 DIAGNOSIS — R5383 Other fatigue: Secondary | ICD-10-CM

## 2023-06-24 DIAGNOSIS — R768 Other specified abnormal immunological findings in serum: Secondary | ICD-10-CM

## 2023-06-24 DIAGNOSIS — M810 Age-related osteoporosis without current pathological fracture: Secondary | ICD-10-CM

## 2023-06-24 DIAGNOSIS — Z0000 Encounter for general adult medical examination without abnormal findings: Secondary | ICD-10-CM | POA: Diagnosis not present

## 2023-06-24 DIAGNOSIS — E785 Hyperlipidemia, unspecified: Secondary | ICD-10-CM

## 2023-06-24 DIAGNOSIS — N1831 Chronic kidney disease, stage 3a: Secondary | ICD-10-CM

## 2023-06-24 DIAGNOSIS — D649 Anemia, unspecified: Secondary | ICD-10-CM | POA: Diagnosis not present

## 2023-06-24 DIAGNOSIS — R748 Abnormal levels of other serum enzymes: Secondary | ICD-10-CM

## 2023-06-24 LAB — COMPREHENSIVE METABOLIC PANEL
ALT: 17 U/L (ref 0–35)
AST: 20 U/L (ref 0–37)
Albumin: 4.3 g/dL (ref 3.5–5.2)
Alkaline Phosphatase: 63 U/L (ref 39–117)
BUN: 22 mg/dL (ref 6–23)
CO2: 28 meq/L (ref 19–32)
Calcium: 9.2 mg/dL (ref 8.4–10.5)
Chloride: 105 meq/L (ref 96–112)
Creatinine, Ser: 1 mg/dL (ref 0.40–1.20)
GFR: 58.69 mL/min — ABNORMAL LOW (ref >60.00)
Glucose, Bld: 75 mg/dL (ref 70–99)
Potassium: 4.5 meq/L (ref 3.5–5.1)
Sodium: 141 meq/L (ref 135–145)
Total Bilirubin: 0.5 mg/dL (ref 0.2–1.2)
Total Protein: 6.7 g/dL (ref 6.0–8.3)

## 2023-06-24 LAB — CBC WITH DIFFERENTIAL/PLATELET
Basophils Absolute: 0 10*3/uL (ref 0.0–0.1)
Basophils Relative: 0.6 % (ref 0.0–3.0)
Eosinophils Absolute: 0 10*3/uL (ref 0.0–0.7)
Eosinophils Relative: 0 % (ref 0.0–5.0)
HCT: 36.5 % (ref 36.0–46.0)
Hemoglobin: 12.5 g/dL (ref 12.0–15.0)
Lymphocytes Relative: 34.2 % (ref 12.0–46.0)
Lymphs Abs: 2.2 10*3/uL (ref 0.7–4.0)
MCHC: 34.3 g/dL (ref 30.0–36.0)
MCV: 95.2 fL (ref 78.0–100.0)
Monocytes Absolute: 0.4 10*3/uL (ref 0.1–1.0)
Monocytes Relative: 5.6 % (ref 3.0–12.0)
Neutro Abs: 3.8 10*3/uL (ref 1.4–7.7)
Neutrophils Relative %: 59.6 % (ref 43.0–77.0)
Platelets: 302 10*3/uL (ref 150.0–400.0)
RBC: 3.83 Mil/uL — ABNORMAL LOW (ref 3.87–5.11)
RDW: 12.9 % (ref 11.5–15.5)
WBC: 6.4 10*3/uL (ref 4.0–10.5)

## 2023-06-24 LAB — LIPID PANEL
Cholesterol: 234 mg/dL — ABNORMAL HIGH (ref 0–200)
HDL: 49.9 mg/dL (ref >39.00)
LDL Cholesterol: 168 mg/dL — ABNORMAL HIGH (ref 0–99)
NonHDL: 183.95
Total CHOL/HDL Ratio: 5
Triglycerides: 78 mg/dL (ref 0.0–149.0)
VLDL: 15.6 mg/dL (ref 0.0–40.0)

## 2023-06-24 LAB — HEMOGLOBIN A1C: Hgb A1c MFr Bld: 5.7 % (ref 4.6–6.5)

## 2023-06-24 LAB — IBC + FERRITIN
Ferritin: 81.6 ng/mL (ref 10.0–291.0)
Iron: 79 ug/dL (ref 42–145)
Saturation Ratios: 26.9 % (ref 20.0–50.0)
TIBC: 294 ug/dL (ref 250.0–450.0)
Transferrin: 210 mg/dL — ABNORMAL LOW (ref 212.0–360.0)

## 2023-06-24 LAB — SEDIMENTATION RATE: Sed Rate: 13 mm/h (ref 0–30)

## 2023-06-24 LAB — LDL CHOLESTEROL, DIRECT: Direct LDL: 151 mg/dL

## 2023-06-24 LAB — TSH: TSH: 1.81 u[IU]/mL (ref 0.35–5.50)

## 2023-06-24 NOTE — Progress Notes (Signed)
Patient ID: Lauren Tanner, female    DOB: 06/27/1956  Age: 67 y.o. MRN: [id number]  The patient is here for annual preventive examination and management of other chronic and acute problems.   The risk factors are reflected in the social history.  The roster of all physicians providing medical care to patient - is listed in the Snapshot section of the chart.  Activities of daily living:  The patient is 100% independent in all ADLs: dressing, toileting, feeding as well as independent mobility  Home safety : The patient has smoke detectors in the home. They wear seatbelts.  There are no firearms at home. There is no violence in the home.   There is no risks for hepatitis, STDs or HIV. There is no   history of blood transfusion. They have no travel history to infectious disease endemic areas of the world.  The patient has seen their dentist in the last six month. They have seen their eye doctor in the last year. They admit to slight hearing difficulty with regard to whispered voices and some television programs.  They have deferred audiologic testing in the last year.  They do not  have excessive sun exposure. Discussed the need for sun protection: hats, long sleeves and use of sunscreen if there is significant sun exposure.   Diet: the importance of a healthy diet is discussed. They do have a healthy diet.  The benefits of regular aerobic exercise were discussed. She walks or biccycles 5  times per week ,  30 minutes.   Depression screen: there are no signs or vegative symptoms of depression- irritability, change in appetite, anhedonia, sadness/tearfullness.  Cognitive assessment: the patient manages all their financial and personal affairs and is actively engaged. They could relate day,date,year and events; recalled 2/3 objects at 3 minutes; performed clock-face test normally.  The following portions of the patient's history were reviewed and updated as appropriate: allergies, current medications,  past family history, past medical history,  past surgical history, past social history  and problem list.  Visual acuity was not assessed per patient preference since she has regular follow up with her ophthalmologist. Hearing and body mass index were assessed and reviewed.   During the course of the visit the patient was educated and counseled about appropriate screening and preventive services including : fall prevention , diabetes screening, nutrition counseling, colorectal cancer screening, and recommended immunizations.    CC: The primary encounter diagnosis was Hyperlipidemia LDL goal <160. Diagnoses of Encounter for preventive health examination, Other fatigue, Osteoporosis, post-menopausal, Elevated liver enzymes, Anemia, unspecified type, Positive ANA (antinuclear antibody), and Pain in thumb joint with movement of right hand were also pertinent to this visit.  No issues except chronic right thumb joint pain    History Lauren Tanner has a past medical history of Anemia, Anxiety, Arthritis, Chronic interstitial cystitis (10/11/2011), Depression, Diverticulosis, Dysrhythmia, GERD (gastroesophageal reflux disease), Headache disorder, History of colonic polyps (2011), and SVT (supraventricular tachycardia) (HCC).   She has a past surgical history that includes Colonoscopy with propofol (N/A, 05/19/2015); Dilation and curettage of uterus; Colonoscopy with propofol (N/A, 12/20/2019); Colonoscopy with propofol (N/A, 07/11/2020); and Fracture surgery.   Her family history includes Cancer (age of onset: 52) in her father; Mental illness in her mother.She reports that she has never smoked. She has never used smokeless tobacco. She reports that she does not currently use alcohol. She reports that she does not use drugs.  Outpatient Medications Prior to Visit  Medication Sig Dispense Refill  AIMOVIG 70 MG/ML SOAJ SMARTSIG:70 Milligram(s) SUB-Q Once a Month     buPROPion (WELLBUTRIN XL) 300 MG 24 hr tablet  Take 300 mg by mouth daily.     clonazePAM (KLONOPIN) 0.5 MG tablet   0   denosumab (PROLIA) 60 MG/ML SOLN injection Inject 60 mg into the skin once. Administer in upper arm, thigh, or abdomen 1 Syringe 1   imipramine (TOFRANIL) 50 MG tablet Take 50 mg by mouth 2 (two) times daily.     lamoTRIgine (LAMICTAL) 100 MG tablet Take 100 mg by mouth 3 (three) times daily.     naratriptan (AMERGE) 2.5 MG tablet Take 2.5 mg by mouth daily as needed.     omeprazole (PRILOSEC) 20 MG capsule TAKE 1 CAPSULE(20 MG) BY MOUTH DAILY 30 capsule 2   famotidine (PEPCID) 40 MG tablet TAKE 1 TABLET DAILY (Patient not taking: Reported on 06/24/2023) 90 tablet 1   doxycycline (VIBRA-TABS) 100 MG tablet Take 1 tablet (100 mg total) by mouth 2 (two) times daily. (Patient not taking: Reported on 06/24/2023) 20 tablet 0   gabapentin (NEURONTIN) 300 MG capsule Take 2 capsules (600 mg total) by mouth at bedtime. (Patient not taking: Reported on 04/16/2023) 60 capsule 1   No facility-administered medications prior to visit.    Review of Systems  Patient denies headache, fevers, malaise, unintentional weight loss, skin rash, eye pain, sinus congestion and sinus pain, sore throat, dysphagia,  hemoptysis , cough, dyspnea, wheezing, chest pain, palpitations, orthopnea, edema, abdominal pain, nausea, melena, diarrhea, constipation, flank pain, dysuria, hematuria, urinary  Frequency, nocturia, numbness, tingling, seizures,  Focal weakness, Loss of consciousness,  Tremor, insomnia, depression, anxiety, and suicidal ideation.     Objective:  BP 98/62   Pulse 92   Ht 5\' 6"  (1.676 m)   Wt 128 lb 6.4 oz (58.2 kg)   SpO2 99%   BMI 20.72 kg/m   Physical Exam Vitals reviewed.  Constitutional:      General: She is not in acute distress.    Appearance: Normal appearance. She is well-developed and normal weight. She is not ill-appearing, toxic-appearing or diaphoretic.  HENT:     Head: Normocephalic.     Right Ear: Tympanic  membrane, ear canal and external ear normal. There is no impacted cerumen.     Left Ear: Tympanic membrane, ear canal and external ear normal. There is no impacted cerumen.     Nose: Nose normal.     Mouth/Throat:     Mouth: Mucous membranes are moist.     Pharynx: Oropharynx is clear.  Eyes:     General: No scleral icterus.       Right eye: No discharge.        Left eye: No discharge.     Conjunctiva/sclera: Conjunctivae normal.     Pupils: Pupils are equal, round, and reactive to light.  Neck:     Thyroid: No thyromegaly.     Vascular: No carotid bruit or JVD.  Cardiovascular:     Rate and Rhythm: Normal rate and regular rhythm.     Heart sounds: Normal heart sounds.  Pulmonary:     Effort: Pulmonary effort is normal. No respiratory distress.     Breath sounds: Normal breath sounds.  Chest:  Breasts:    Breasts are symmetrical.     Right: Normal. No swelling, inverted nipple, mass, nipple discharge, skin change or tenderness.     Left: Normal. No swelling, inverted nipple, mass, nipple discharge, skin change or tenderness.  Abdominal:     General: Bowel sounds are normal.     Palpations: Abdomen is soft. There is no mass.     Tenderness: There is no abdominal tenderness. There is no guarding or rebound.  Musculoskeletal:        General: Normal range of motion.     Cervical back: Normal range of motion and neck supple.  Lymphadenopathy:     Cervical: No cervical adenopathy.     Upper Body:     Right upper body: No supraclavicular, axillary or pectoral adenopathy.     Left upper body: No supraclavicular, axillary or pectoral adenopathy.  Skin:    General: Skin is warm and dry.  Neurological:     General: No focal deficit present.     Mental Status: She is alert and oriented to person, place, and time. Mental status is at baseline.  Psychiatric:        Mood and Affect: Mood normal.        Behavior: Behavior normal.        Thought Content: Thought content normal.         Judgment: Judgment normal.      Assessment & Plan:  Hyperlipidemia LDL goal <160 -     Lipid panel -     LDL cholesterol, direct -     Comprehensive metabolic panel -     Hemoglobin A1c  Encounter for preventive health examination Assessment & Plan: age appropriate education and counseling updated, referrals for preventative services and immunizations addressed, dietary and smoking counseling addressed, most recent labs reviewed.  I have personally reviewed and have noted:   1) the patient's medical and social history 2) The pt's use of alcohol, tobacco, and illicit drugs 3) The patient's current medications and supplements 4) Functional ability including ADL's, fall risk, home safety risk, hearing and visual impairment 5) Diet and physical activities 6) Evidence for depression or mood disorder 7) The patient's height, weight, and BMI have been recorded in the chart   I have made referrals, and provided counseling and education based on review of the above    Other fatigue -     CBC with Differential/Platelet -     TSH  Osteoporosis, post-menopausal Assessment & Plan: Managed with Prolia , last dose on Apr 29 2023.  She is taking an MVI with calcium and has a healthy Vit D level on prior checks.    Elevated liver enzymes Assessment & Plan: Normalized by repeat testing   Lab Results  Component Value Date   ALT 21 04/09/2023   AST 21 04/09/2023   ALKPHOS 67 04/09/2023   BILITOT 0.4 04/09/2023      Anemia, unspecified type Assessment & Plan: Etiology unclear  .  B12,  folate and iron studies are normal.   . Seen by hematology in September,.  BM biopsy deferred,  3 month follow up advised and scheduled in December,  Given her positive ANA positive during screening and the FH of autoimmune (1 cousin  with SLE 1 , 1 with sarcoid), will repeat ANA today along with CBC .    Lab Results  Component Value Date   ANA POSITIVE (A) 04/09/2023     Lab Results  Component  Value Date   WBC 6.3 04/16/2023   HGB 10.9 (L) 04/16/2023   HCT 32.5 (L) 04/16/2023   MCV 95.6 04/16/2023   PLT 302 04/16/2023     Orders: -     ANA w/Reflex if Positive -  IBC + Ferritin  Positive ANA (antinuclear antibody) Assessment & Plan: Low antibody titer of 1:80,  cytoplasmic,  in the setting of recent ehrlichiosis infection and mild anemia.  Rechecking today.  She has no synvovitis on eexam   Orders: -     Sedimentation rate  Pain in thumb joint with movement of right hand -     Sedimentation rate      I provided 40 minutes of  face-to-face time during this encounter reviewing patient's current problems and past surgeries,  recent labs and imaging studies, providing counseling on the above mentioned problems , and coordination  of care .   Follow-up: No follow-ups on file.   Sherlene Shams, MD

## 2023-06-24 NOTE — Assessment & Plan Note (Signed)

## 2023-06-24 NOTE — Patient Instructions (Signed)
YOU are allowed to salt your food as tolerated  !  (It will support your chronically low blood pressure)  I am repeating your ANA and CBC today.  If the CBC has normalized you do not need to see Dr Orlie Dakin and can cancel your appointment with him.

## 2023-06-24 NOTE — Assessment & Plan Note (Signed)
Normalized by repeat testing   Lab Results  Component Value Date   ALT 21 04/09/2023   AST 21 04/09/2023   ALKPHOS 67 04/09/2023   BILITOT 0.4 04/09/2023

## 2023-06-24 NOTE — Assessment & Plan Note (Signed)
Low antibody titer of 1:80,  cytoplasmic,  in the setting of recent ehrlichiosis infection and mild anemia.  Resolved along with anemia on Recheck  today.  She has no synvovitis on eexam

## 2023-06-24 NOTE — Assessment & Plan Note (Addendum)
Managed with Prolia , last dose on Apr 29 2023.  She is taking an MVI with calcium and has a healthy Vit D level on prior checks.

## 2023-06-24 NOTE — Assessment & Plan Note (Addendum)
Etiology unclear  .  B12,  folate and iron studies are normal.   . Seen by hematology in September,.  BM biopsy deferred,  3 month follow up advised and scheduled in December,  Given her positive ANA positive during screening and the FH of autoimmune (1 cousin  with SLE 1 , 1 with sarcoid), will repeat ANA today along with CBC .    Lab Results  Component Value Date   ANA POSITIVE (A) 04/09/2023     Lab Results  Component Value Date   WBC 6.3 04/16/2023   HGB 10.9 (L) 04/16/2023   HCT 32.5 (L) 04/16/2023   MCV 95.6 04/16/2023   PLT 302 04/16/2023

## 2023-06-25 LAB — ANA W/REFLEX IF POSITIVE: Anti Nuclear Antibody (ANA): NEGATIVE

## 2023-06-26 NOTE — Assessment & Plan Note (Signed)
Using the Framingham risk calculator,  her 10 year risk of coronary artery disease is 4%. No treatment indicated  Lab Results  Component Value Date   CHOL 234 (H) 06/24/2023   HDL 49.90 06/24/2023   LDLCALC 168 (H) 06/24/2023   LDLDIRECT 151.0 06/24/2023   TRIG 78.0 06/24/2023   CHOLHDL 5 06/24/2023

## 2023-06-26 NOTE — Assessment & Plan Note (Signed)
Stable by repeat assessment  Lab Results  Component Value Date   CREATININE 1.00 06/24/2023

## 2023-06-30 ENCOUNTER — Encounter: Payer: Self-pay | Admitting: Internal Medicine

## 2023-07-16 ENCOUNTER — Ambulatory Visit: Payer: BC Managed Care – PPO | Admitting: Oncology

## 2023-07-16 ENCOUNTER — Other Ambulatory Visit: Payer: BC Managed Care – PPO

## 2023-08-04 ENCOUNTER — Other Ambulatory Visit: Payer: Self-pay | Admitting: *Deleted

## 2023-08-04 DIAGNOSIS — D649 Anemia, unspecified: Secondary | ICD-10-CM

## 2023-08-05 ENCOUNTER — Encounter: Payer: Self-pay | Admitting: Oncology

## 2023-08-05 ENCOUNTER — Inpatient Hospital Stay: Payer: BC Managed Care – PPO | Admitting: Oncology

## 2023-08-05 ENCOUNTER — Inpatient Hospital Stay: Payer: BC Managed Care – PPO | Attending: Oncology

## 2023-08-05 VITALS — BP 120/81 | HR 76 | Temp 98.4°F | Resp 16 | Ht 66.0 in | Wt 130.9 lb

## 2023-08-05 DIAGNOSIS — Z79899 Other long term (current) drug therapy: Secondary | ICD-10-CM | POA: Insufficient documentation

## 2023-08-05 DIAGNOSIS — D649 Anemia, unspecified: Secondary | ICD-10-CM

## 2023-08-05 DIAGNOSIS — Z800 Family history of malignant neoplasm of digestive organs: Secondary | ICD-10-CM | POA: Insufficient documentation

## 2023-08-05 DIAGNOSIS — Z862 Personal history of diseases of the blood and blood-forming organs and certain disorders involving the immune mechanism: Secondary | ICD-10-CM | POA: Insufficient documentation

## 2023-08-05 LAB — CBC WITH DIFFERENTIAL/PLATELET
Abs Immature Granulocytes: 0.03 10*3/uL (ref 0.00–0.07)
Basophils Absolute: 0 10*3/uL (ref 0.0–0.1)
Basophils Relative: 1 %
Eosinophils Absolute: 0 10*3/uL (ref 0.0–0.5)
Eosinophils Relative: 0 %
HCT: 36.7 % (ref 36.0–46.0)
Hemoglobin: 12.4 g/dL (ref 12.0–15.0)
Immature Granulocytes: 1 %
Lymphocytes Relative: 36 %
Lymphs Abs: 2.4 10*3/uL (ref 0.7–4.0)
MCH: 31.4 pg (ref 26.0–34.0)
MCHC: 33.8 g/dL (ref 30.0–36.0)
MCV: 92.9 fL (ref 80.0–100.0)
Monocytes Absolute: 0.4 10*3/uL (ref 0.1–1.0)
Monocytes Relative: 6 %
Neutro Abs: 3.7 10*3/uL (ref 1.7–7.7)
Neutrophils Relative %: 56 %
Platelets: 282 10*3/uL (ref 150–400)
RBC: 3.95 MIL/uL (ref 3.87–5.11)
RDW: 12.7 % (ref 11.5–15.5)
WBC: 6.5 10*3/uL (ref 4.0–10.5)
nRBC: 0 % (ref 0.0–0.2)

## 2023-08-05 NOTE — Progress Notes (Signed)
 [facility]  [phone] [phone]  ID: Lauren Tanner OB: [birth year]  MR#: [id number]  RDW#:[id number]  Patient Care Team: Marylynn Verneita CROME, MD as PCP - General (Internal Medicine)  CHIEF COMPLAINT: Anemia, unspecified.  INTERVAL HISTORY: Patient returns to clinic today for repeat laboratory work and further evaluation.  She currently feels well and is asymptomatic.  She does not complain of any weakness or fatigue. She has no neurologic complaints.  She denies any recent fevers or illnesses.  She has a good appetite and denies weight loss.  She has no chest pain, shortness of breath, cough, or hemoptysis.  She denies any nausea, vomiting, constipation, or diarrhea.  She has no melena or hematochezia.  She has no urinary complaints.  Patient offers no specific complaints today.  REVIEW OF SYSTEMS:   Review of Systems  Constitutional: Negative.  Negative for fever, malaise/fatigue and weight loss.  Respiratory: Negative.  Negative for cough, hemoptysis and shortness of breath.   Cardiovascular: Negative.  Negative for chest pain and leg swelling.  Gastrointestinal: Negative.  Negative for abdominal pain, blood in stool and melena.  Genitourinary: Negative.  Negative for dysuria.  Musculoskeletal: Negative.  Negative for back pain.  Skin: Negative.  Negative for rash.  Neurological: Negative.  Negative for dizziness, focal weakness, weakness and headaches.  Psychiatric/Behavioral: Negative.  The patient is not nervous/anxious.     As per HPI. Otherwise, a complete review of systems is negative.  PAST MEDICAL HISTORY: Past Medical History:  Diagnosis Date   Anemia    Anxiety    Arthritis    Chronic interstitial cystitis 10/11/2011   Depression    no history of hospitalizations   Diverticulosis    Dysrhythmia    GERD (gastroesophageal reflux disease)    Headache disorder    managed with imipramine   History of colonic polyps 2011   last  colonoscopy, [facility]   SVT (supraventricular tachycardia) (HCC)    normal screening ER workup    PAST SURGICAL HISTORY: Past Surgical History:  Procedure Laterality Date   COLONOSCOPY WITH PROPOFOL  N/A 05/19/2015   Procedure: COLONOSCOPY WITH PROPOFOL ;  Surgeon: Lamar ONEIDA Holmes, MD;  Location: [facility] ENDOSCOPY;  Service: Endoscopy;  Laterality: N/A;   COLONOSCOPY WITH PROPOFOL  N/A 12/20/2019   Procedure: COLONOSCOPY WITH PROPOFOL ;  Surgeon: Janalyn Keene NOVAK, MD;  Location: [facility] ENDOSCOPY;  Service: Endoscopy;  Laterality: N/A;   COLONOSCOPY WITH PROPOFOL  N/A 07/11/2020   Procedure: COLONOSCOPY WITH PROPOFOL ;  Surgeon: Therisa Bi, MD;  Location: [facility] ENDOSCOPY;  Service: Endoscopy;  Laterality: N/A;   DILATION AND CURETTAGE OF UTERUS     FRACTURE SURGERY      FAMILY HISTORY: Family History  Problem Relation Age of Onset   Mental illness Mother    Cancer Father 18       colon Ca,  bladder ca   Breast cancer Neg Hx     ADVANCED DIRECTIVES (Y/N):  N  HEALTH MAINTENANCE: Social History   Tobacco Use   Smoking status: Never   Smokeless tobacco: Never  Vaping Use   Vaping status: Never Used  Substance Use Topics   Alcohol use: Not Currently   Drug use: Never     Colonoscopy:  PAP:  Bone density:  Lipid panel:  Allergies  Allergen Reactions   Ampicillin Hives   Septra [Bactrim] Hives   Sulfamethoxazole-Trimethoprim Hives   Sulfa Antibiotics Hives    Current Outpatient Medications  Medication Sig Dispense Refill   AIMOVIG 70 MG/ML  SOAJ SMARTSIG:70 Milligram(s) SUB-Q Once a Month     buPROPion (WELLBUTRIN XL) 300 MG 24 hr tablet Take 300 mg by mouth daily.     clonazePAM  (KLONOPIN ) 0.5 MG tablet   0   denosumab  (PROLIA ) 60 MG/ML SOLN injection Inject 60 mg into the skin once. Administer in upper arm, thigh, or abdomen 1 Syringe 1   famotidine  (PEPCID ) 40 MG tablet TAKE 1 TABLET DAILY 90 tablet 1   imipramine (TOFRANIL) 50 MG tablet Take 50 mg by mouth 2  (two) times daily.     lamoTRIgine (LAMICTAL) 100 MG tablet Take 100 mg by mouth 3 (three) times daily.     naratriptan (AMERGE) 2.5 MG tablet Take 2.5 mg by mouth daily as needed.     No current facility-administered medications for this visit.    OBJECTIVE: Vitals:   08/05/23 0945  BP: 120/81  Pulse: 76  Resp: 16  Temp: 98.4 F (36.9 C)  SpO2: 100%     Body mass index is 21.13 kg/m.    ECOG FS:0 - Asymptomatic  General: Well-developed, well-nourished, no acute distress. Eyes: Pink conjunctiva, anicteric sclera. HEENT: Normocephalic, moist mucous membranes. Lungs: No audible wheezing or coughing. Heart: Regular rate and rhythm. Abdomen: Soft, nontender, no obvious distention. Musculoskeletal: No edema, cyanosis, or clubbing. Neuro: Alert, answering all questions appropriately. Cranial nerves grossly intact. Skin: No rashes or petechiae noted. Psych: Normal affect.  LAB RESULTS:  Lab Results  Component Value Date   NA 141 06/24/2023   K 4.5 06/24/2023   CL 105 06/24/2023   CO2 28 06/24/2023   GLUCOSE 75 06/24/2023   BUN 22 06/24/2023   CREATININE 1.00 06/24/2023   CALCIUM 9.2 06/24/2023   PROT 6.7 06/24/2023   ALBUMIN 4.3 06/24/2023   AST 20 06/24/2023   ALT 17 06/24/2023   ALKPHOS 63 06/24/2023   BILITOT 0.5 06/24/2023   GFRNONAA 55 (L) 03/14/2020   GFRAA >60 03/14/2020    Lab Results  Component Value Date   WBC 6.5 08/05/2023   NEUTROABS 3.7 08/05/2023   HGB 12.4 08/05/2023   HCT 36.7 08/05/2023   MCV 92.9 08/05/2023   PLT 282 08/05/2023   Lab Results  Component Value Date   IRON 79 06/24/2023   TIBC 294.0 06/24/2023   IRONPCTSAT 26.9 06/24/2023   Lab Results  Component Value Date   FERRITIN 81.6 06/24/2023    STUDIES: No results found.  ASSESSMENT: Anemia, unspecified.  PLAN:    Anemia, unspecified: Resolved.  Patient's hemoglobin is now within normal limits.  Previously all of her other laboratory work was also either negative or  within normal limits.  [facility] myeloid panel revealed a variant of unknown significance.  Patient reports she is taking oral iron supplementation and is tolerating treatment well.  She has been instructed to continue as directed.  No further intervention is needed.  Patient does not require bone marrow biopsy.  No follow-up has been scheduled.  Please refer patient back if there are any questions or concerns.   I spent a total of 20 minutes reviewing chart data, face-to-face evaluation with the patient, counseling and coordination of care as detailed above.   Patient expressed understanding and was in agreement with this plan. She also understands that She can call clinic at any time with any questions, concerns, or complaints.    Evalene JINNY Reusing, MD   08/05/2023 12:49 PM

## 2023-08-12 DIAGNOSIS — G43719 Chronic migraine without aura, intractable, without status migrainosus: Secondary | ICD-10-CM | POA: Diagnosis not present

## 2023-08-12 DIAGNOSIS — M542 Cervicalgia: Secondary | ICD-10-CM | POA: Diagnosis not present

## 2023-08-22 DIAGNOSIS — H5213 Myopia, bilateral: Secondary | ICD-10-CM | POA: Diagnosis not present

## 2023-08-22 DIAGNOSIS — H04123 Dry eye syndrome of bilateral lacrimal glands: Secondary | ICD-10-CM | POA: Diagnosis not present

## 2023-08-22 DIAGNOSIS — H269 Unspecified cataract: Secondary | ICD-10-CM | POA: Diagnosis not present

## 2023-08-22 DIAGNOSIS — H524 Presbyopia: Secondary | ICD-10-CM | POA: Diagnosis not present

## 2023-09-12 DIAGNOSIS — F331 Major depressive disorder, recurrent, moderate: Secondary | ICD-10-CM | POA: Diagnosis not present

## 2023-09-12 DIAGNOSIS — F411 Generalized anxiety disorder: Secondary | ICD-10-CM | POA: Diagnosis not present

## 2023-09-30 DIAGNOSIS — M1711 Unilateral primary osteoarthritis, right knee: Secondary | ICD-10-CM | POA: Diagnosis not present

## 2023-10-05 MED ORDER — DENOSUMAB 60 MG/ML ~~LOC~~ SOSY
60.0000 mg | PREFILLED_SYRINGE | Freq: Once | SUBCUTANEOUS | Status: AC
  Administered 2023-12-05: 60 mg via SUBCUTANEOUS

## 2023-10-05 NOTE — Addendum Note (Signed)
 Addended by: Warden Fillers on: 10/05/2023 10:29 PM   Modules accepted: Orders

## 2023-10-28 DIAGNOSIS — M542 Cervicalgia: Secondary | ICD-10-CM | POA: Diagnosis not present

## 2023-10-28 DIAGNOSIS — G43719 Chronic migraine without aura, intractable, without status migrainosus: Secondary | ICD-10-CM | POA: Diagnosis not present

## 2023-10-29 ENCOUNTER — Encounter: Payer: Self-pay | Admitting: *Deleted

## 2023-10-29 ENCOUNTER — Other Ambulatory Visit: Payer: Self-pay | Admitting: *Deleted

## 2023-10-29 DIAGNOSIS — M810 Age-related osteoporosis without current pathological fracture: Secondary | ICD-10-CM

## 2023-11-10 NOTE — Telephone Encounter (Signed)
 Left voicemail for pt to schedule overdue Prolia injection. Info below.

## 2023-12-02 ENCOUNTER — Encounter: Payer: Self-pay | Admitting: Internal Medicine

## 2023-12-02 MED ORDER — FAMOTIDINE 40 MG PO TABS: 40.0000 mg | ORAL_TABLET | Freq: Every day | ORAL | 1 refills | Status: DC

## 2023-12-04 ENCOUNTER — Other Ambulatory Visit (INDEPENDENT_AMBULATORY_CARE_PROVIDER_SITE_OTHER)

## 2023-12-04 DIAGNOSIS — M810 Age-related osteoporosis without current pathological fracture: Secondary | ICD-10-CM

## 2023-12-04 LAB — VITAMIN D 25 HYDROXY (VIT D DEFICIENCY, FRACTURES): VITD: 72.02 ng/mL (ref 30.00–100.00)

## 2023-12-05 ENCOUNTER — Ambulatory Visit

## 2023-12-05 ENCOUNTER — Encounter: Payer: Self-pay | Admitting: Internal Medicine

## 2023-12-05 DIAGNOSIS — M810 Age-related osteoporosis without current pathological fracture: Secondary | ICD-10-CM | POA: Diagnosis not present

## 2023-12-05 MED ORDER — DENOSUMAB 60 MG/ML ~~LOC~~ SOSY
60.0000 mg | PREFILLED_SYRINGE | Freq: Once | SUBCUTANEOUS | Status: AC
  Administered 2024-06-08: 60 mg via SUBCUTANEOUS

## 2023-12-05 NOTE — Progress Notes (Signed)
Patient presented for Prolia injection to left arm patient voiced no concerns nor showed any signs of distress during injection  

## 2023-12-10 DIAGNOSIS — F411 Generalized anxiety disorder: Secondary | ICD-10-CM | POA: Diagnosis not present

## 2023-12-10 DIAGNOSIS — F331 Major depressive disorder, recurrent, moderate: Secondary | ICD-10-CM | POA: Diagnosis not present

## 2023-12-30 DIAGNOSIS — G43719 Chronic migraine without aura, intractable, without status migrainosus: Secondary | ICD-10-CM | POA: Diagnosis not present

## 2023-12-30 DIAGNOSIS — M542 Cervicalgia: Secondary | ICD-10-CM | POA: Diagnosis not present

## 2024-03-01 ENCOUNTER — Ambulatory Visit: Payer: Self-pay

## 2024-03-01 NOTE — Telephone Encounter (Signed)
 FYI Only or Action Required?: FYI only for provider.  Patient was last seen in primary care on 06/24/2023 by Marylynn Verneita CROME, MD.  Called Nurse Triage reporting Shoulder Pain.  Symptoms began 5 weeks ago.  Interventions attempted: ice and heat, massage, Lidocaine  patch.  Symptoms are: unchanged.  Triage Disposition: See PCP Within 2 Weeks  Patient/caregiver understands and will follow disposition?: Yes                 Neck and shoulder pain ( 6 out of 10 pain when it flares )  Reason for Disposition  [1] MILD pain (e.g., does not interfere with normal activities) AND [2] present > 7 days  Answer Assessment - Initial Assessment Questions 1. ONSET: When did the pain start?     5 weeks 2. LOCATION: Where is the pain located?     Right shoulder and neck 3. PAIN: How bad is the pain? (Scale 1-10; or mild, moderate, severe)     6/10 4. WORK OR EXERCISE: Has there been any recent work or exercise that involved this part of the body?     no 5. CAUSE: What do you think is causing the shoulder pain?     unknown 6. OTHER SYMPTOMS: Do you have any other symptoms? (e.g., neck pain, swelling, rash, fever, numbness, weakness)     no  Protocols used: Shoulder Pain-A-AH

## 2024-03-08 ENCOUNTER — Ambulatory Visit: Admitting: Internal Medicine

## 2024-03-09 DIAGNOSIS — F331 Major depressive disorder, recurrent, moderate: Secondary | ICD-10-CM | POA: Diagnosis not present

## 2024-03-09 DIAGNOSIS — F5105 Insomnia due to other mental disorder: Secondary | ICD-10-CM | POA: Diagnosis not present

## 2024-03-09 DIAGNOSIS — F411 Generalized anxiety disorder: Secondary | ICD-10-CM | POA: Diagnosis not present

## 2024-03-25 ENCOUNTER — Other Ambulatory Visit: Payer: Self-pay | Admitting: Internal Medicine

## 2024-03-25 DIAGNOSIS — Z1231 Encounter for screening mammogram for malignant neoplasm of breast: Secondary | ICD-10-CM

## 2024-03-30 DIAGNOSIS — R208 Other disturbances of skin sensation: Secondary | ICD-10-CM | POA: Diagnosis not present

## 2024-03-30 DIAGNOSIS — L728 Other follicular cysts of the skin and subcutaneous tissue: Secondary | ICD-10-CM | POA: Diagnosis not present

## 2024-03-31 DIAGNOSIS — M542 Cervicalgia: Secondary | ICD-10-CM | POA: Diagnosis not present

## 2024-03-31 DIAGNOSIS — G43719 Chronic migraine without aura, intractable, without status migrainosus: Secondary | ICD-10-CM | POA: Diagnosis not present

## 2024-04-09 ENCOUNTER — Other Ambulatory Visit: Payer: Self-pay | Admitting: Medical Genetics

## 2024-04-14 ENCOUNTER — Encounter: Payer: Self-pay | Admitting: *Deleted

## 2024-04-15 ENCOUNTER — Other Ambulatory Visit
Admission: RE | Admit: 2024-04-15 | Discharge: 2024-04-15 | Disposition: A | Discharge status: Home / Self Care | Payer: Self-pay | Source: Ambulatory Visit | Attending: Medical Genetics | Admitting: Medical Genetics

## 2024-04-15 ENCOUNTER — Other Ambulatory Visit

## 2024-04-19 ENCOUNTER — Encounter: Payer: Self-pay | Admitting: Internal Medicine

## 2024-04-19 NOTE — Telephone Encounter (Signed)
 Pt has been scheduled to see Kenney Roys, NP to discuss

## 2024-04-20 ENCOUNTER — Encounter: Payer: Self-pay | Admitting: Family

## 2024-04-20 ENCOUNTER — Ambulatory Visit
Admission: RE | Admit: 2024-04-20 | Discharge: 2024-04-20 | Disposition: A | Discharge status: Home / Self Care | Source: Ambulatory Visit | Attending: Internal Medicine | Admitting: Internal Medicine

## 2024-04-20 ENCOUNTER — Ambulatory Visit: Admitting: Family

## 2024-04-20 VITALS — BP 116/76 | HR 112 | Temp 98.0°F | Ht 66.0 in | Wt 128.8 lb

## 2024-04-20 DIAGNOSIS — Z1231 Encounter for screening mammogram for malignant neoplasm of breast: Secondary | ICD-10-CM | POA: Insufficient documentation

## 2024-04-20 DIAGNOSIS — K219 Gastro-esophageal reflux disease without esophagitis: Secondary | ICD-10-CM | POA: Diagnosis not present

## 2024-04-20 NOTE — Progress Notes (Unsigned)
 Acute Office Visit  Subjective:     Patient ID: Lauren Tanner, female    DOB: 1955/12/09, 68 y.o.   MRN: [id number]  Chief Complaint  Patient presents with  . Referral    Gi for GERD     HPI Patient is in today requesting a referral to GI for GERD. She is taking Pepcid  and tolerating it well. However, she does report having trouble with tomato based foods, pineapple, orange juice and other acidic foods.   Review of Systems  Constitutional: Negative.   HENT: Negative.    Respiratory: Negative.    Cardiovascular: Negative.   Gastrointestinal:  Positive for heartburn and nausea. Negative for vomiting.  Musculoskeletal: Negative.   Neurological: Negative.   Psychiatric/Behavioral: Negative.    All other systems reviewed and are negative.   Past Medical History:  Diagnosis Date  . Anemia   . Anxiety   . Arthritis   . Chronic interstitial cystitis 10/11/2011  . Depression    no history of hospitalizations  . Diverticulosis   . Dysrhythmia   . GERD (gastroesophageal reflux disease)   . Headache disorder    managed with imipramine  . History of colonic polyps 2011   last colonoscopy, [facility]  . SVT (supraventricular tachycardia)    normal screening ER workup    Social History   Socioeconomic History  . Marital status: Married    Spouse name: Not on file  . Number of children: Not on file  . Years of education: Not on file  . Highest education level: Doctorate  Occupational History  . Not on file  Tobacco Use  . Smoking status: Never  . Smokeless tobacco: Never  Vaping Use  . Vaping status: Never Used  Substance and Sexual Activity  . Alcohol use: Not Currently  . Drug use: Never  . Sexual activity: Not Currently  Other Topics Concern  . Not on file  Social History Narrative  . Not on file   Social Drivers of Health   Financial Resource Strain: Low Risk  (06/24/2023)   Overall Financial Resource Strain (CARDIA)   . Difficulty of Paying Living  Expenses: Not hard at all  Food Insecurity: No Food Insecurity (06/24/2023)   Hunger Vital Sign   . Worried About Programme researcher, broadcasting/film/video in the Last Year: Never true   . Ran Out of Food in the Last Year: Never true  Transportation Needs: No Transportation Needs (06/24/2023)   PRAPARE - Transportation   . Lack of Transportation (Medical): No   . Lack of Transportation (Non-Medical): No  Physical Activity: Insufficiently Active (06/24/2023)   Exercise Vital Sign   . Days of Exercise per Week: 3 days   . Minutes of Exercise per Session: 40 min  Stress: No Stress Concern Present (06/24/2023)   [employer] of Occupational Health - Occupational Stress Questionnaire   . Feeling of Stress : Only a little  Social Connections: Moderately Isolated (06/24/2023)   Social Connection and Isolation Panel   . Frequency of Communication with Friends and Family: Never   . Frequency of Social Gatherings with Friends and Family: Once a week   . Attends Religious Services: Never   . Active Member of Clubs or Organizations: No   . Attends Banker Meetings: 1 to 4 times per year   . Marital Status: Married  Catering manager Violence: Not on file    Past Surgical History:  Procedure Laterality Date  . COLONOSCOPY WITH PROPOFOL  N/A 05/19/2015  Procedure: COLONOSCOPY WITH PROPOFOL ;  Surgeon: Lamar ONEIDA Holmes, MD;  Location: [facility] ENDOSCOPY;  Service: Endoscopy;  Laterality: N/A;  . COLONOSCOPY WITH PROPOFOL  N/A 12/20/2019   Procedure: COLONOSCOPY WITH PROPOFOL ;  Surgeon: Janalyn Keene NOVAK, MD;  Location: [facility] ENDOSCOPY;  Service: Endoscopy;  Laterality: N/A;  . COLONOSCOPY WITH PROPOFOL  N/A 07/11/2020   Procedure: COLONOSCOPY WITH PROPOFOL ;  Surgeon: Therisa Bi, MD;  Location: [facility] ENDOSCOPY;  Service: Endoscopy;  Laterality: N/A;  . DILATION AND CURETTAGE OF UTERUS    . FRACTURE SURGERY      Family History  Problem Relation Age of Onset  . Mental illness Mother   . Cancer Father  89       colon Ca,  bladder ca  . Breast cancer Neg Hx     Allergies  Allergen Reactions  . Ampicillin Hives  . Septra [Bactrim] Hives  . Sulfamethoxazole-Trimethoprim Hives  . Sulfa Antibiotics Hives    Current Outpatient Medications on File Prior to Visit  Medication Sig Dispense Refill  . AIMOVIG 70 MG/ML SOAJ SMARTSIG:70 Milligram(s) SUB-Q Once a Month    . buPROPion (WELLBUTRIN XL) 300 MG 24 hr tablet Take 300 mg by mouth daily.    . clonazePAM  (KLONOPIN ) 0.5 MG tablet   0  . denosumab  (PROLIA ) 60 MG/ML SOLN injection Inject 60 mg into the skin once. Administer in upper arm, thigh, or abdomen 1 Syringe 1  . famotidine  (PEPCID ) 40 MG tablet Take 1 tablet (40 mg total) by mouth daily. 90 tablet 1  . imipramine (TOFRANIL) 50 MG tablet Take 50 mg by mouth 2 (two) times daily.    SABRA lamoTRIgine (LAMICTAL) 100 MG tablet Take 100 mg by mouth 3 (three) times daily.    . naratriptan (AMERGE) 2.5 MG tablet Take 2.5 mg by mouth daily as needed.     Current Facility-Administered Medications on File Prior to Visit  Medication Dose Route Frequency Provider Last Rate Last Admin  . [START ON 06/07/2024] denosumab  (PROLIA ) injection 60 mg  60 mg Subcutaneous Once Tullo, Teresa L, MD        BP 116/76   Pulse (!) 112   Temp 98 F (36.7 C)   Ht 5' 6 (1.676 m)   Wt 128 lb 12.8 oz (58.4 kg)   SpO2 99%   BMI 20.79 kg/m chart     Objective:    BP 116/76   Pulse (!) 112   Temp 98 F (36.7 C)   Ht 5' 6 (1.676 m)   Wt 128 lb 12.8 oz (58.4 kg)   SpO2 99%   BMI 20.79 kg/m    Physical Exam Vitals and nursing note reviewed.  Constitutional:      Appearance: Normal appearance. She is normal weight.  HENT:     Mouth/Throat:     Mouth: Mucous membranes are moist.  Cardiovascular:     Rate and Rhythm: Normal rate and regular rhythm.     Pulses: Normal pulses.     Heart sounds: Normal heart sounds.  Pulmonary:     Effort: Pulmonary effort is normal.     Breath sounds: Normal breath  sounds.  Musculoskeletal:        General: Normal range of motion.  Skin:    General: Skin is warm and dry.  Neurological:     General: No focal deficit present.     Mental Status: She is alert and oriented to person, place, and time. Mental status is at baseline.  Psychiatric:  Mood and Affect: Mood normal.        Behavior: Behavior normal.        Thought Content: Thought content normal.    No results found for any visits on 04/20/24.      Assessment & Plan:   Problem List Items Addressed This Visit     GERD (gastroesophageal reflux disease) - Primary   Relevant Orders   Ambulatory referral to Gastroenterology  Call the office with any questions or concerns.   No orders of the defined types were placed in this encounter.   No follow-ups on file.  Kalynne Womac B Shaquan Puerta, FNP

## 2024-04-25 LAB — GENECONNECT MOLECULAR SCREEN: Genetic Analysis Overall Interpretation: NEGATIVE

## 2024-04-28 ENCOUNTER — Other Ambulatory Visit: Payer: Self-pay | Admitting: Internal Medicine

## 2024-05-06 ENCOUNTER — Telehealth: Payer: Self-pay

## 2024-05-06 NOTE — Telephone Encounter (Signed)
 Prolia  VOB initiated via [email or web address]  Next Prolia  inj DUE: 06/06/24

## 2024-05-18 ENCOUNTER — Telehealth: Payer: Self-pay

## 2024-05-18 DIAGNOSIS — K2100 Gastro-esophageal reflux disease with esophagitis, without bleeding: Secondary | ICD-10-CM

## 2024-05-18 NOTE — Telephone Encounter (Signed)
 Copied from CRM [id number]. Topic: Referral - Question >> May 18, 2024  4:17 PM Viola F wrote: Reason for CRM: Patient wants to know if another referral can be put in for a different gastroenterologist because the one she was referred to is booking 6 months out. Please let patient know when referral is in via MyChart portal message.

## 2024-05-19 ENCOUNTER — Other Ambulatory Visit (HOSPITAL_COMMUNITY): Payer: Self-pay

## 2024-05-19 NOTE — Telephone Encounter (Signed)
 MEDICAL PA SUBMITTED VIA BLUE-E.

## 2024-05-19 NOTE — Telephone Encounter (Signed)
 SABRA

## 2024-05-20 ENCOUNTER — Other Ambulatory Visit (HOSPITAL_COMMUNITY): Payer: Self-pay

## 2024-05-20 NOTE — Telephone Encounter (Signed)
 Pt ready for scheduling for PROLIA  on or after : 06/06/24  Option# 1: Buy/Bill (Office supplied medication)  Out-of-pocket cost due at time of clinic visit: $0  Number of injection/visits approved: 2  Primary: [employer] Prolia  co-insurance: 0% Admin fee co-insurance: 0%  Secondary: --- Prolia  co-insurance:  Admin fee co-insurance:   Medical Benefit Details: Date Benefits were checked: 05/14/24 Deductible: NO/ Coinsurance: 0%/ Admin Fee: 0%  Prior Auth: APPROVED PA# [id number] Expiration Date: 05/19/24-05/19/25  # of doses approved: 2 ----------------------------------------------------------------------- Option# 2- Med Obtained from pharmacy:  Pharmacy benefit: Copay $--- (Paid to pharmacy) Admin Fee: --- (Pay at clinic)  Prior Auth: --- PA# Expiration Date:   # of doses approved:   If patient wants fill through the pharmacy benefit please send prescription to: ---, and include estimated need by date in rx notes. Pharmacy will ship medication directly to the office.  Patient --- eligible for Prolia  Copay Card. Copay Card can make patient's cost as little as $25. Link to apply: [email or web address]://[email or web address]  ** This summary of benefits is an estimation of the patient's out-of-pocket cost. Exact cost may very based on individual plan coverage.

## 2024-05-27 DIAGNOSIS — M542 Cervicalgia: Secondary | ICD-10-CM | POA: Diagnosis not present

## 2024-05-27 DIAGNOSIS — G43719 Chronic migraine without aura, intractable, without status migrainosus: Secondary | ICD-10-CM | POA: Diagnosis not present

## 2024-05-27 NOTE — Addendum Note (Signed)
 Addended by: MARYLYNN VERNEITA CROME on: 05/27/2024 02:50 PM   Modules accepted: Orders

## 2024-06-07 DIAGNOSIS — F411 Generalized anxiety disorder: Secondary | ICD-10-CM | POA: Diagnosis not present

## 2024-06-07 DIAGNOSIS — F331 Major depressive disorder, recurrent, moderate: Secondary | ICD-10-CM | POA: Diagnosis not present

## 2024-06-07 DIAGNOSIS — F5105 Insomnia due to other mental disorder: Secondary | ICD-10-CM | POA: Diagnosis not present

## 2024-06-08 ENCOUNTER — Ambulatory Visit (INDEPENDENT_AMBULATORY_CARE_PROVIDER_SITE_OTHER)

## 2024-06-08 DIAGNOSIS — M810 Age-related osteoporosis without current pathological fracture: Secondary | ICD-10-CM | POA: Diagnosis not present

## 2024-06-08 MED ORDER — DENOSUMAB 60 MG/ML ~~LOC~~ SOSY: 60.0000 mg | PREFILLED_SYRINGE | Freq: Once | SUBCUTANEOUS | Status: AC

## 2024-06-08 NOTE — Progress Notes (Signed)
 Pt received Prolia  injection subcutaneously in Left  arm. Pt tolerated it well with no complaints or concerns.

## 2024-06-15 NOTE — Telephone Encounter (Signed)
 open in error

## 2024-06-28 ENCOUNTER — Ambulatory Visit: Payer: BC Managed Care – PPO | Admitting: Internal Medicine

## 2024-06-28 ENCOUNTER — Encounter: Payer: Self-pay | Admitting: Internal Medicine

## 2024-06-28 VITALS — BP 126/74 | HR 86 | Ht 66.0 in | Wt 131.8 lb

## 2024-06-28 DIAGNOSIS — R7301 Impaired fasting glucose: Secondary | ICD-10-CM | POA: Diagnosis not present

## 2024-06-28 DIAGNOSIS — Z23 Encounter for immunization: Secondary | ICD-10-CM

## 2024-06-28 DIAGNOSIS — D649 Anemia, unspecified: Secondary | ICD-10-CM | POA: Diagnosis not present

## 2024-06-28 DIAGNOSIS — Z0000 Encounter for general adult medical examination without abnormal findings: Secondary | ICD-10-CM | POA: Diagnosis not present

## 2024-06-28 DIAGNOSIS — M25519 Pain in unspecified shoulder: Secondary | ICD-10-CM | POA: Insufficient documentation

## 2024-06-28 DIAGNOSIS — M810 Age-related osteoporosis without current pathological fracture: Secondary | ICD-10-CM

## 2024-06-28 DIAGNOSIS — R5383 Other fatigue: Secondary | ICD-10-CM | POA: Diagnosis not present

## 2024-06-28 DIAGNOSIS — M79602 Pain in left arm: Secondary | ICD-10-CM

## 2024-06-28 DIAGNOSIS — E785 Hyperlipidemia, unspecified: Secondary | ICD-10-CM

## 2024-06-28 DIAGNOSIS — N1831 Chronic kidney disease, stage 3a: Secondary | ICD-10-CM

## 2024-06-28 LAB — LIPID PANEL
Cholesterol: 238 mg/dL — ABNORMAL HIGH (ref 0–200)
HDL: 54.7 mg/dL (ref >39.00)
LDL Cholesterol: 165 mg/dL — ABNORMAL HIGH (ref 0–99)
NonHDL: 183.47
Total CHOL/HDL Ratio: 4
Triglycerides: 92 mg/dL (ref 0.0–149.0)
VLDL: 18.4 mg/dL (ref 0.0–40.0)

## 2024-06-28 LAB — COMPREHENSIVE METABOLIC PANEL WITH GFR
ALT: 18 U/L (ref 0–35)
AST: 20 U/L (ref 0–37)
Albumin: 4.3 g/dL (ref 3.5–5.2)
Alkaline Phosphatase: 54 U/L (ref 39–117)
BUN: 27 mg/dL — ABNORMAL HIGH (ref 6–23)
CO2: 30 meq/L (ref 19–32)
Calcium: 9.5 mg/dL (ref 8.4–10.5)
Chloride: 104 meq/L (ref 96–112)
Creatinine, Ser: 1.01 mg/dL (ref 0.40–1.20)
GFR: 57.58 mL/min — ABNORMAL LOW (ref >60.00)
Glucose, Bld: 71 mg/dL (ref 70–99)
Potassium: 4.6 meq/L (ref 3.5–5.1)
Sodium: 140 meq/L (ref 135–145)
Total Bilirubin: 0.4 mg/dL (ref 0.2–1.2)
Total Protein: 6.8 g/dL (ref 6.0–8.3)

## 2024-06-28 LAB — CBC WITH DIFFERENTIAL/PLATELET
Basophils Absolute: 0 K/uL (ref 0.0–0.1)
Basophils Relative: 0.4 % (ref 0.0–3.0)
Eosinophils Absolute: 0 K/uL (ref 0.0–0.7)
Eosinophils Relative: 0 % (ref 0.0–5.0)
HCT: 37 % (ref 36.0–46.0)
Hemoglobin: 12.6 g/dL (ref 12.0–15.0)
Lymphocytes Relative: 28.9 % (ref 12.0–46.0)
Lymphs Abs: 1.9 K/uL (ref 0.7–4.0)
MCHC: 34 g/dL (ref 30.0–36.0)
MCV: 95.2 fl (ref 78.0–100.0)
Monocytes Absolute: 0.4 K/uL (ref 0.1–1.0)
Monocytes Relative: 6.4 % (ref 3.0–12.0)
Neutro Abs: 4.3 K/uL (ref 1.4–7.7)
Neutrophils Relative %: 64.3 % (ref 43.0–77.0)
Platelets: 306 K/uL (ref 150.0–400.0)
RBC: 3.89 Mil/uL (ref 3.87–5.11)
RDW: 12.8 % (ref 11.5–15.5)
WBC: 6.7 K/uL (ref 4.0–10.5)

## 2024-06-28 LAB — LDL CHOLESTEROL, DIRECT: Direct LDL: 154 mg/dL

## 2024-06-28 LAB — HEMOGLOBIN A1C: Hgb A1c MFr Bld: 5.3 % (ref 4.6–6.5)

## 2024-06-28 LAB — TSH: TSH: 2.72 u[IU]/mL (ref 0.35–5.50)

## 2024-06-28 MED ORDER — ESTRADIOL 0.01 % VA CREA: 1.0000 | TOPICAL_CREAM | Freq: Every day | VAGINAL | 12 refills | Status: AC

## 2024-06-28 MED ORDER — ESTRADIOL 0.01 % VA CREA: 1.0000 | TOPICAL_CREAM | VAGINAL | 3 refills | Status: AC

## 2024-06-28 NOTE — Assessment & Plan Note (Addendum)
 Managed with Prolia  , last dose given Jun 08 2024 on Apr 29 2023.  She is taking an MVI with calcium and has a healthy Vit D level on prior checks. D

## 2024-06-28 NOTE — Patient Instructions (Addendum)
 Benefiber can be taken daily as a fiber supplement  but do not take your medications with it   Estrogen cream restart:  1st tube sent to [facility].  The rest sent to mail order   DEXAdue onw and ordered

## 2024-06-28 NOTE — Progress Notes (Signed)
 Patient ID: Lauren Tanner, female    DOB: Sep 17, 1955  Age: 68 y.o. MRN: [id number]  The patient is here for annual preventive examination and management of other chronic and acute problems.   The risk factors are reflected in the social history.  The roster of all physicians providing medical care to patient - is listed in the Snapshot section of the chart.  Activities of daily living:  The patient is 100% independent in all ADLs: dressing, toileting, feeding as well as independent mobility  Home safety : The patient has smoke detectors in the home. They wear seatbelts.  There are no firearms at home. There is no violence in the home.   There is no risks for hepatitis, STDs or HIV. There is no   history of blood transfusion. They have no travel history to infectious disease endemic areas of the world.  The patient has seen their dentist in the last six month. They have seen their eye doctor in the last year. They admit to slight hearing difficulty with regard to whispered voices and some television programs.  They have deferred audiologic testing in the last year.  They do not  have excessive sun exposure. Discussed the need for sun protection: hats, long sleeves and use of sunscreen if there is significant sun exposure.   Diet: the importance of a healthy diet is discussed. They do have a healthy diet.  The benefits of regular aerobic exercise were discussed. She walks or rides her stationery b ike  daily for 30 to 60 minute  Depression screen: there are no signs or vegative symptoms of depression- irritability, change in appetite, anhedonia, sadness/tearfullness.  Cognitive assessment: the patient manages all their financial and personal affairs and is actively engaged. They could relate day,date,year and events; recalled 2/3 objects at 3 minutes; performed clock-face test normally.  The following portions of the patient's history were reviewed and updated as appropriate: allergies, current  medications, past family history, past medical history,  past surgical history, past social history  and problem list.  Visual acuity was not assessed per patient preference since she has regular follow up with her ophthalmologist. Hearing and body mass index were assessed and reviewed.   During the course of the visit the patient was educated and counseled about appropriate screening and preventive services including : fall prevention , diabetes screening, nutrition counseling, colorectal cancer screening, and recommended immunizations.    CC: The primary encounter diagnosis was Encounter for preventive health examination. Diagnoses of Hyperlipidemia LDL goal <160, Anemia, unspecified type, Other fatigue, Impaired fasting glucose, Osteoporosis, post-menopausal, Need for influenza vaccination, Stage 3a chronic kidney disease (HCC), Anterior shoulder pain, and Arm pain, diffuse, left were also pertinent to this visit.    1) persistent right shouler pain since July when she retired.  Remote injury  no recent trauma.    2) recent fall in the dark while walking at night rying to avoid a car   3 History Leannah has a past medical history of Anemia, Anxiety, Arthritis, Chronic interstitial cystitis (10/11/2011), Depression, Diverticulosis, Dysrhythmia, GERD (gastroesophageal reflux disease), Headache disorder, History of colonic polyps (2011), and SVT (supraventricular tachycardia).   She has a past surgical history that includes Colonoscopy with propofol  (N/A, 05/19/2015); Dilation and curettage of uterus; Colonoscopy with propofol  (N/A, 12/20/2019); Colonoscopy with propofol  (N/A, 07/11/2020); and Fracture surgery.   Her family history includes Cancer (age of onset: 15) in her father; Mental illness in her mother.She reports that she has never smoked. She has  never used smokeless tobacco. She reports that she does not currently use alcohol. She reports that she does not use drugs.  Outpatient  Medications Prior to Visit  Medication Sig Dispense Refill   AIMOVIG 70 MG/ML SOAJ SMARTSIG:70 Milligram(s) SUB-Q Once a Month     buPROPion (WELLBUTRIN XL) 300 MG 24 hr tablet Take 300 mg by mouth daily.     clonazePAM  (KLONOPIN ) 0.5 MG tablet   0   denosumab  (PROLIA ) 60 MG/ML SOLN injection Inject 60 mg into the skin once. Administer in upper arm, thigh, or abdomen 1 Syringe 1   famotidine  (PEPCID ) 40 MG tablet TAKE 1 TABLET BY MOUTH DAILY 90 tablet 3   imipramine (TOFRANIL) 50 MG tablet Take 50 mg by mouth 2 (two) times daily.     lamoTRIgine (LAMICTAL) 100 MG tablet Take 100 mg by mouth 3 (three) times daily.     naratriptan (AMERGE) 2.5 MG tablet Take 2.5 mg by mouth daily as needed.     Facility-Administered Medications Prior to Visit  Medication Dose Route Frequency Provider Last Rate Last Admin   [START ON 12/06/2024] denosumab  (PROLIA ) injection 60 mg  60 mg Subcutaneous Once Marylynn Verneita CROME, MD        Review of Systems  Patient denies headache, fevers, malaise, unintentional weight loss, skin rash, eye pain, sinus congestion and sinus pain, sore throat, dysphagia,  hemoptysis , cough, dyspnea, wheezing, chest pain, palpitations, orthopnea, edema, abdominal pain, nausea, melena, diarrhea, constipation, flank pain, dysuria, hematuria, urinary  Frequency, nocturia, numbness, tingling, seizures,  Focal weakness, Loss of consciousness,  Tremor, insomnia, depression, anxiety, and suicidal ideation.     Objective:  BP 126/74   Pulse 86   Ht 5' 6 (1.676 m)   Wt 131 lb 12.8 oz (59.8 kg)   SpO2 99%   BMI 21.27 kg/m   Physical Exam Vitals reviewed.  Constitutional:      General: She is not in acute distress.    Appearance: Normal appearance. She is well-developed and normal weight. She is not ill-appearing, toxic-appearing or diaphoretic.  HENT:     Head: Normocephalic.     Right Ear: Tympanic membrane, ear canal and external ear normal. There is no impacted cerumen.     Left  Ear: Tympanic membrane, ear canal and external ear normal. There is no impacted cerumen.     Nose: Nose normal.     Mouth/Throat:     Mouth: Mucous membranes are moist.     Pharynx: Oropharynx is clear.  Eyes:     General: No scleral icterus.       Right eye: No discharge.        Left eye: No discharge.     Conjunctiva/sclera: Conjunctivae normal.     Pupils: Pupils are equal, round, and reactive to light.  Neck:     Thyroid : No thyromegaly.     Vascular: No carotid bruit or JVD.  Cardiovascular:     Rate and Rhythm: Normal rate and regular rhythm.     Heart sounds: Normal heart sounds.  Pulmonary:     Effort: Pulmonary effort is normal. No respiratory distress.     Breath sounds: Normal breath sounds.  Chest:  Breasts:    Breasts are symmetrical.     Right: Normal. No swelling, inverted nipple, mass, nipple discharge, skin change or tenderness.     Left: Normal. No swelling, inverted nipple, mass, nipple discharge, skin change or tenderness.  Abdominal:     General: Bowel sounds are  normal.     Palpations: Abdomen is soft. There is no mass.     Tenderness: There is no abdominal tenderness. There is no guarding or rebound.  Musculoskeletal:        General: Normal range of motion.     Cervical back: Normal range of motion and neck supple.  Lymphadenopathy:     Cervical: No cervical adenopathy.     Upper Body:     Right upper body: No supraclavicular, axillary or pectoral adenopathy.     Left upper body: No supraclavicular, axillary or pectoral adenopathy.  Skin:    General: Skin is warm and dry.  Neurological:     General: No focal deficit present.     Mental Status: She is alert and oriented to person, place, and time. Mental status is at baseline.  Psychiatric:        Mood and Affect: Mood normal.        Behavior: Behavior normal.        Thought Content: Thought content normal.        Judgment: Judgment normal.     Assessment & Plan:  Encounter for preventive  health examination Assessment & Plan: age appropriate education and counseling updated, referrals for preventative services and immunizations addressed, dietary and smoking counseling addressed, most recent labs reviewed.  I have personally reviewed and have noted:   1) the patient's medical and social history 2) The pt's use of alcohol, tobacco, and illicit drugs 3) The patient's current medications and supplements 4) Functional ability including ADL's, fall risk, home safety risk, hearing and visual impairment 5) Diet and physical activities 6) Evidence for depression or mood disorder 7) The patient's height, weight, and BMI have been recorded in the chart  I have made referrals, and provided counseling and education based on review of the above    Hyperlipidemia LDL goal <160 -     Lipid panel -     LDL cholesterol, direct  Anemia, unspecified type Assessment & Plan: Resolved, Etiology unclear  .  B12,  folate and iron studies are normal.   . Seen by hematology in September,.  BM biopsy deferred,  3 month follow up advised and scheduled in December,  Given her positive ANA positive during screening and the FH of autoimmune (1 cousin  with SLE 1 , 1 with sarcoid), norml repeat ANA t Lab Results  Component Value Date   ANA Negative 06/24/2023     Lab Results  Component Value Date   WBC 6.7 06/28/2024   HGB 12.6 06/28/2024   HCT 37.0 06/28/2024   MCV 95.2 06/28/2024   PLT 306.0 06/28/2024     Orders: -     CBC with Differential/Platelet  Other fatigue -     TSH  Impaired fasting glucose -     Comprehensive metabolic panel with GFR -     Hemoglobin A1c  Osteoporosis, post-menopausal Assessment & Plan: Managed with Prolia  , last dose given Jun 08 2024 on Apr 29 2023.  She is taking an MVI with calcium and has a healthy Vit D level on prior checks. D  Orders: -     DG Bone Density; Future  Need for influenza vaccination -     Flu vaccine HIGH DOSE PF(Fluzone  Trivalent)  Stage 3a chronic kidney disease (HCC) Assessment & Plan: Stable by repeat assessment  Lab Results  Component Value Date   CREATININE 1.01 06/28/2024      Anterior shoulder pain  Arm pain,  diffuse, left Assessment & Plan: She declines formal pt\\in favor of home exercises.     Other orders -     Estradiol ; Place 1 Applicatorful vaginally 3 (three) times a week.  Dispense: 42.5 g; Refill: 3 -     Estradiol ; Place 1 Applicatorful vaginally at bedtime. FOR 14 DAYS, then thrice weekly thereafter  Dispense: 42.5 g; Refill: 12      I provided 40 minutes of  face-to-face time during this encounter reviewing patient's current problems and past surgeries,  recent labs and imaging studies, providing counseling on the above mentioned problems , and coordination  of care .   Follow-up: No follow-ups on file.   Verneita LITTIE Kettering, MD

## 2024-06-28 NOTE — Assessment & Plan Note (Signed)
 She declines formal pt\\in favor of home exercises.

## 2024-06-28 NOTE — Assessment & Plan Note (Signed)

## 2024-06-28 NOTE — Assessment & Plan Note (Signed)
 Resolved, Etiology unclear  .  B12,  folate and iron studies are normal.   . Seen by hematology in September,.  BM biopsy deferred,  3 month follow up advised and scheduled in December,  Given her positive ANA positive during screening and the FH of autoimmune (1 cousin  with SLE 1 , 1 with sarcoid), norml repeat ANA t Lab Results  Component Value Date   ANA Negative 06/24/2023     Lab Results  Component Value Date   WBC 6.7 06/28/2024   HGB 12.6 06/28/2024   HCT 37.0 06/28/2024   MCV 95.2 06/28/2024   PLT 306.0 06/28/2024

## 2024-06-28 NOTE — Assessment & Plan Note (Signed)
 Stable by repeat assessment  Lab Results  Component Value Date   CREATININE 1.01 06/28/2024

## 2024-06-29 ENCOUNTER — Ambulatory Visit: Payer: Self-pay | Admitting: Internal Medicine

## 2024-07-01 ENCOUNTER — Other Ambulatory Visit: Payer: Self-pay | Admitting: Internal Medicine

## 2024-07-01 MED ORDER — ESTROGENS CONJUGATED 0.625 MG/GM VA CREA: TOPICAL_CREAM | VAGINAL | 12 refills | Status: AC

## 2024-07-28 ENCOUNTER — Telehealth: Payer: Self-pay

## 2024-07-28 NOTE — Telephone Encounter (Signed)
 Received approval letter from [facility]    Effective 07/29/24, Jubb[employer]i and [employer] will be preferred. Approval letter attached to chart

## 2024-08-17 ENCOUNTER — Encounter: Payer: Self-pay | Admitting: Internal Medicine

## 2024-08-17 DIAGNOSIS — M810 Age-related osteoporosis without current pathological fracture: Secondary | ICD-10-CM

## 2024-08-20 ENCOUNTER — Encounter: Payer: Self-pay | Admitting: Pediatrics

## 2024-08-26 ENCOUNTER — Ambulatory Visit (INDEPENDENT_AMBULATORY_CARE_PROVIDER_SITE_OTHER): Payer: Self-pay

## 2024-08-26 VITALS — BP 131/78 | HR 92 | Ht 65.0 in | Wt 136.6 lb

## 2024-08-26 DIAGNOSIS — Z411 Encounter for cosmetic surgery: Secondary | ICD-10-CM

## 2024-08-26 DIAGNOSIS — R238 Other skin changes: Secondary | ICD-10-CM

## 2024-09-01 ENCOUNTER — Telehealth: Payer: Self-pay

## 2024-09-01 NOTE — Telephone Encounter (Signed)
 Pt booked a Halo with you 10-15-24, for when you need to send the cream, she agreed to the quote given in her mychart

## 2024-09-03 ENCOUNTER — Telehealth: Payer: Self-pay | Admitting: Internal Medicine

## 2024-09-03 NOTE — Telephone Encounter (Signed)
 Placed in red folder

## 2024-09-03 NOTE — Telephone Encounter (Signed)
 Surgical  clearance form has been placed in back mailbox for for signature from Dr. Marylynn.

## 2024-09-21 ENCOUNTER — Other Ambulatory Visit

## 2024-09-21 ENCOUNTER — Ambulatory Visit: Admitting: Pediatrics

## 2024-10-15 ENCOUNTER — Other Ambulatory Visit
